# Patient Record
Sex: Female | Born: 1965 | Race: White | Hispanic: No | Smoking: Former smoker
Health system: Southern US, Community
[De-identification: ages and names within clinical notes are randomized; demographics above are authoritative.]

## PROBLEM LIST (undated history)

## (undated) DIAGNOSIS — I10 Essential (primary) hypertension: Secondary | ICD-10-CM

## (undated) DIAGNOSIS — G4700 Insomnia, unspecified: Secondary | ICD-10-CM

## (undated) DIAGNOSIS — K5909 Other constipation: Secondary | ICD-10-CM

## (undated) DIAGNOSIS — N189 Chronic kidney disease, unspecified: Secondary | ICD-10-CM

## (undated) DIAGNOSIS — F419 Anxiety disorder, unspecified: Secondary | ICD-10-CM

## (undated) DIAGNOSIS — F32A Depression, unspecified: Secondary | ICD-10-CM

## (undated) DIAGNOSIS — T7840XA Allergy, unspecified, initial encounter: Secondary | ICD-10-CM

## (undated) DIAGNOSIS — E785 Hyperlipidemia, unspecified: Secondary | ICD-10-CM

## (undated) HISTORY — DX: Allergy, unspecified, initial encounter: T78.40XA

## (undated) HISTORY — PX: BUNIONECTOMY: SHX129

## (undated) HISTORY — PX: WISDOM TOOTH EXTRACTION: SHX21

## (undated) HISTORY — DX: Depression, unspecified: F32.A

## (undated) HISTORY — DX: Hyperlipidemia, unspecified: E78.5

## (undated) HISTORY — PX: TUBAL LIGATION: SHX77

## (undated) HISTORY — DX: Other constipation: K59.09

## (undated) HISTORY — DX: Chronic kidney disease, unspecified: N18.9

---

## 1999-11-19 ENCOUNTER — Encounter: Admission: RE | Admit: 1999-11-19 | Discharge: 1999-11-19 | Payer: Self-pay | Admitting: *Deleted

## 1999-11-19 ENCOUNTER — Encounter: Payer: Self-pay | Admitting: *Deleted

## 2000-12-01 ENCOUNTER — Other Ambulatory Visit: Admission: RE | Admit: 2000-12-01 | Discharge: 2000-12-01 | Payer: Self-pay | Admitting: Obstetrics and Gynecology

## 2001-06-08 ENCOUNTER — Encounter: Admission: RE | Admit: 2001-06-08 | Discharge: 2001-09-06 | Payer: Self-pay

## 2001-06-10 ENCOUNTER — Encounter: Payer: Self-pay | Admitting: *Deleted

## 2001-06-10 ENCOUNTER — Ambulatory Visit (HOSPITAL_COMMUNITY): Admission: RE | Admit: 2001-06-10 | Discharge: 2001-06-10 | Payer: Self-pay | Admitting: *Deleted

## 2001-09-06 ENCOUNTER — Encounter: Admission: RE | Admit: 2001-09-06 | Discharge: 2001-12-05 | Payer: Self-pay

## 2002-01-14 ENCOUNTER — Other Ambulatory Visit: Admission: RE | Admit: 2002-01-14 | Discharge: 2002-01-14 | Payer: Self-pay | Admitting: Obstetrics and Gynecology

## 2004-05-31 ENCOUNTER — Ambulatory Visit (HOSPITAL_COMMUNITY): Admission: RE | Admit: 2004-05-31 | Discharge: 2004-05-31 | Payer: Self-pay | Admitting: Family Medicine

## 2013-02-07 ENCOUNTER — Encounter (INDEPENDENT_AMBULATORY_CARE_PROVIDER_SITE_OTHER): Payer: Self-pay | Admitting: Ophthalmology

## 2013-03-14 ENCOUNTER — Encounter (INDEPENDENT_AMBULATORY_CARE_PROVIDER_SITE_OTHER): Payer: Self-pay | Admitting: Ophthalmology

## 2013-07-25 ENCOUNTER — Emergency Department (HOSPITAL_COMMUNITY)
Admission: EM | Admit: 2013-07-25 | Discharge: 2013-07-25 | Disposition: A | Discharge status: Home / Self Care | Payer: Self-pay | Attending: Emergency Medicine | Admitting: Emergency Medicine

## 2013-07-25 ENCOUNTER — Encounter (HOSPITAL_COMMUNITY): Payer: Self-pay | Admitting: Emergency Medicine

## 2013-07-25 DIAGNOSIS — X500XXA Overexertion from strenuous movement or load, initial encounter: Secondary | ICD-10-CM | POA: Insufficient documentation

## 2013-07-25 DIAGNOSIS — M25511 Pain in right shoulder: Secondary | ICD-10-CM

## 2013-07-25 DIAGNOSIS — Z791 Long term (current) use of non-steroidal anti-inflammatories (NSAID): Secondary | ICD-10-CM | POA: Insufficient documentation

## 2013-07-25 DIAGNOSIS — IMO0002 Reserved for concepts with insufficient information to code with codable children: Secondary | ICD-10-CM | POA: Insufficient documentation

## 2013-07-25 DIAGNOSIS — G4700 Insomnia, unspecified: Secondary | ICD-10-CM | POA: Insufficient documentation

## 2013-07-25 DIAGNOSIS — Y990 Civilian activity done for income or pay: Secondary | ICD-10-CM | POA: Insufficient documentation

## 2013-07-25 DIAGNOSIS — F411 Generalized anxiety disorder: Secondary | ICD-10-CM | POA: Insufficient documentation

## 2013-07-25 DIAGNOSIS — Y9289 Other specified places as the place of occurrence of the external cause: Secondary | ICD-10-CM | POA: Insufficient documentation

## 2013-07-25 DIAGNOSIS — Z87891 Personal history of nicotine dependence: Secondary | ICD-10-CM | POA: Insufficient documentation

## 2013-07-25 DIAGNOSIS — I10 Essential (primary) hypertension: Secondary | ICD-10-CM | POA: Insufficient documentation

## 2013-07-25 DIAGNOSIS — Y9389 Activity, other specified: Secondary | ICD-10-CM | POA: Insufficient documentation

## 2013-07-25 DIAGNOSIS — Z79899 Other long term (current) drug therapy: Secondary | ICD-10-CM | POA: Insufficient documentation

## 2013-07-25 HISTORY — DX: Insomnia, unspecified: G47.00

## 2013-07-25 HISTORY — DX: Essential (primary) hypertension: I10

## 2013-07-25 HISTORY — DX: Anxiety disorder, unspecified: F41.9

## 2013-07-25 MED ORDER — NAPROXEN 500 MG PO TABS: 500.0000 mg | ORAL_TABLET | Freq: Two times a day (BID) | ORAL | Status: DC

## 2013-07-25 MED ORDER — HYDROCODONE-ACETAMINOPHEN 5-325 MG PO TABS
2.0000 | ORAL_TABLET | Freq: Once | ORAL | Status: AC
  Administered 2013-07-25: 2 via ORAL
  Filled 2013-07-25: qty 2

## 2013-07-25 MED ORDER — HYDROCODONE-ACETAMINOPHEN 5-325 MG PO TABS: 1.0000 | ORAL_TABLET | ORAL | Status: DC | PRN

## 2013-07-25 MED ORDER — IBUPROFEN 800 MG PO TABS
800.0000 mg | ORAL_TABLET | Freq: Once | ORAL | Status: AC
  Administered 2013-07-25: 800 mg via ORAL
  Filled 2013-07-25: qty 1

## 2013-07-25 NOTE — ED Notes (Signed)
Patient is alert and oriented x3.  She is complaining of right shoulder pain that started Friday at work after lifting a box.  She is complaining of burning, numbness and tingling in the right shoulder down to her hand.  She can lift the arm above her head with some relief.  Currently she rates her pain 10 of 10.

## 2013-07-25 NOTE — ED Notes (Signed)
Per EMS report: pt c/o right shoulder pain that began last Friday when pt attempted to pick up a box.  Pt reports pain has gradually become worse.  Pt went to urgent care this morning and was given percocet and an anti-inflammatory and that did not help.  Pt a/o x 4.  Pt is ambulatory in triage.  NAD noted.

## 2013-07-25 NOTE — Discharge Instructions (Signed)
Take naproxen for pain. For severe pain take norco or vicodin however realize they have the potential for addiction and it can make you sleepy and has tylenol in it.  No operating machinery while taking. If you were given medicines take as directed.  If you are on coumadin or contraceptives realize their levels and effectiveness is altered by many different medicines.  If you have any reaction (rash, tongues swelling, other) to the medicines stop taking and see a physician.   Please follow up as directed and return to the ER or see a physician for new or worsening symptoms.  Thank you.

## 2013-07-25 NOTE — ED Notes (Signed)
Patient is alert and oriented x3.  She was given DC instructions and follow up visit instructions.  Patient gave verbal understanding. She was DC ambulatory under her own power to home.  V/S stable.  He was not showing any signs of distress on DC 

## 2013-07-31 NOTE — ED Provider Notes (Signed)
CSN: [id number]     Arrival date & time 07/25/13  2017 History   First MD Initiated Contact with Patient 07/25/13 2200     Chief Complaint  Patient presents with  . Shoulder Pain     (Consider location/radiation/quality/duration/timing/severity/associated sxs/prior Treatment) HPI Comments: 48 yo female with htn hx presents with right shoulder pain since Friday after lifting a box.  Pt has had worsening pain since with movement of shoulder.  Pt seen at urgent care however pain persisting.  No chest pain or RUQ pain.  Pt tried NSAIDS without relief.  No hx of shoulder surgery.    Patient is a 48 y.o. female presenting with shoulder pain. The history is provided by the patient.  Shoulder Pain This is a new problem. Pertinent negatives include no chest pain, no abdominal pain, no headaches and no shortness of breath.    Past Medical History  Diagnosis Date  . Hypertension   . Anxiety   . Insomnia    Past Surgical History  Procedure Laterality Date  . Tubal ligation    . Bunionectomy     No family history on file. History  Substance Use Topics  . Smoking status: Former Games developer  . Smokeless tobacco: Not on file  . Alcohol Use: Yes     Comment: occasional   OB History   Grav Para Term Preterm Abortions TAB SAB Ect Mult Living                 Review of Systems  Constitutional: Negative for fever.  Respiratory: Negative for shortness of breath.   Cardiovascular: Negative for chest pain and leg swelling.  Gastrointestinal: Negative for vomiting and abdominal pain.  Musculoskeletal: Positive for arthralgias. Negative for joint swelling.  Skin: Negative for color change and rash.  Neurological: Negative for weakness, light-headedness and headaches.      Allergies  Codeine  Home Medications   Current Outpatient Rx  Name  Route  Sig  Dispense  Refill  . ALPRAZolam (XANAX) 1 MG tablet   Oral   Take 1 mg by mouth 2 (two) times daily as needed for anxiety.         Marland Kitchen  ibuprofen (ADVIL,MOTRIN) 200 MG tablet   Oral   Take 200 mg by mouth every 6 (six) hours as needed (pain.).         Marland Kitchen metoprolol (LOPRESSOR) 100 MG tablet   Oral   Take 200 mg by mouth daily.         . Oxycodone-Acetaminophen (PERCOCET PO)   Oral   Take 1 tablet by mouth once.         Marland Kitchen PRESCRIPTION MEDICATION   Oral   Take 1 tablet by mouth once. Anti-inflammatory medication.         Marland Kitchen zolpidem (AMBIEN) 10 MG tablet   Oral   Take 10 mg by mouth at bedtime as needed for sleep.         Marland Kitchen HYDROcodone-acetaminophen (NORCO) 5-325 MG per tablet   Oral   Take 1-2 tablets by mouth every 4 (four) hours as needed.   10 tablet   0   . naproxen (NAPROSYN) 500 MG tablet   Oral   Take 1 tablet (500 mg total) by mouth 2 (two) times daily.   20 tablet   0    BP 138/84  Pulse 76  Temp(Src) 98 F (36.7 C) (Oral)  Resp 20  SpO2 100%  LMP 07/12/2013 Physical Exam  Nursing note and  vitals reviewed. Constitutional: She appears well-developed and well-nourished. No distress.  HENT:  Head: Normocephalic and atraumatic.  Eyes: Pupils are equal, round, and reactive to light.  Neck: Normal range of motion. Neck supple.  Cardiovascular: Normal rate.   Pulmonary/Chest: Effort normal.  Abdominal: Soft. She exhibits no distension. There is no tenderness.  Musculoskeletal: She exhibits tenderness. She exhibits no edema.  Tender right anterior shoulder worse with empty can test, pt has brief 5+ strength however short lasted due to pain.   Pt has full rom with pain in ext rotation.  Relief with extreme flexion nv intact right arm Mild trapezius tender/ tight musculature on the right  Skin: Skin is warm.    ED Course  Procedures (including critical care time) Labs Review Labs Reviewed - No data to display Imaging Review No results found.   EKG Interpretation None      MDM   Final diagnoses:  Acute pain of right shoulder   Shoulder strain. Concern for ligamentous  injury.  Pt likely will need MRI however ortho consult outpt discussed with patient. No concern for cardiac or biliary at this time.   Pain improved in ED.   Results and differential diagnosis were discussed with the patient. Close follow up outpatient was discussed, patient comfortable with the plan.        Victoria SkeensJoshua M Shanon Seawright, MD 07/31/13 2219

## 2014-07-03 ENCOUNTER — Other Ambulatory Visit: Payer: Self-pay | Admitting: Orthopedic Surgery

## 2014-07-07 ENCOUNTER — Other Ambulatory Visit: Payer: Self-pay | Admitting: Orthopedic Surgery

## 2014-07-10 ENCOUNTER — Ambulatory Visit (HOSPITAL_COMMUNITY)
Admission: RE | Admit: 2014-07-10 | Discharge: 2014-07-10 | Disposition: A | Discharge status: Home / Self Care | Payer: BLUE CROSS/BLUE SHIELD | Source: Ambulatory Visit | Attending: Orthopedic Surgery | Admitting: Orthopedic Surgery

## 2014-07-10 ENCOUNTER — Encounter (HOSPITAL_COMMUNITY): Payer: Self-pay

## 2014-07-10 ENCOUNTER — Encounter (HOSPITAL_COMMUNITY)
Admission: RE | Admit: 2014-07-10 | Discharge: 2014-07-10 | Disposition: A | Discharge status: Home / Self Care | Payer: BLUE CROSS/BLUE SHIELD | Source: Ambulatory Visit | Attending: Orthopedic Surgery | Admitting: Orthopedic Surgery

## 2014-07-10 DIAGNOSIS — M542 Cervicalgia: Secondary | ICD-10-CM | POA: Insufficient documentation

## 2014-07-10 DIAGNOSIS — F419 Anxiety disorder, unspecified: Secondary | ICD-10-CM | POA: Diagnosis not present

## 2014-07-10 DIAGNOSIS — I10 Essential (primary) hypertension: Secondary | ICD-10-CM | POA: Insufficient documentation

## 2014-07-10 DIAGNOSIS — Z01818 Encounter for other preprocedural examination: Secondary | ICD-10-CM

## 2014-07-10 LAB — COMPREHENSIVE METABOLIC PANEL
ALT: 14 U/L (ref 0–35)
ANION GAP: 10 (ref 5–15)
AST: 20 U/L (ref 0–37)
Albumin: 4.5 g/dL (ref 3.5–5.2)
Alkaline Phosphatase: 58 U/L (ref 39–117)
BUN: 7 mg/dL (ref 6–23)
CALCIUM: 9.6 mg/dL (ref 8.4–10.5)
CHLORIDE: 105 mmol/L (ref 96–112)
CO2: 22 mmol/L (ref 19–32)
Creatinine, Ser: 0.96 mg/dL (ref 0.50–1.10)
GFR calc Af Amer: 80 mL/min — ABNORMAL LOW (ref >90)
GFR calc non Af Amer: 69 mL/min — ABNORMAL LOW (ref >90)
Glucose, Bld: 102 mg/dL — ABNORMAL HIGH (ref 70–99)
POTASSIUM: 3.6 mmol/L (ref 3.5–5.1)
SODIUM: 137 mmol/L (ref 135–145)
TOTAL PROTEIN: 7.7 g/dL (ref 6.0–8.3)
Total Bilirubin: 0.4 mg/dL (ref 0.3–1.2)

## 2014-07-10 LAB — PROTIME-INR
INR: 1.04 (ref 0.00–1.49)
PROTHROMBIN TIME: 13.7 s (ref 11.6–15.2)

## 2014-07-10 LAB — CBC WITH DIFFERENTIAL/PLATELET
Basophils Absolute: 0 10*3/uL (ref 0.0–0.1)
Basophils Relative: 1 % (ref 0–1)
Eosinophils Absolute: 0 10*3/uL (ref 0.0–0.7)
Eosinophils Relative: 1 % (ref 0–5)
HCT: 35.6 % — ABNORMAL LOW (ref 36.0–46.0)
Hemoglobin: 11.6 g/dL — ABNORMAL LOW (ref 12.0–15.0)
Lymphocytes Relative: 30 % (ref 12–46)
Lymphs Abs: 1.3 10*3/uL (ref 0.7–4.0)
MCH: 28.3 pg (ref 26.0–34.0)
MCHC: 32.6 g/dL (ref 30.0–36.0)
MCV: 86.8 fL (ref 78.0–100.0)
Monocytes Absolute: 0.3 10*3/uL (ref 0.1–1.0)
Monocytes Relative: 7 % (ref 3–12)
NEUTROS ABS: 2.8 10*3/uL (ref 1.7–7.7)
Neutrophils Relative %: 61 % (ref 43–77)
PLATELETS: 297 10*3/uL (ref 150–400)
RBC: 4.1 MIL/uL (ref 3.87–5.11)
RDW: 17.7 % — ABNORMAL HIGH (ref 11.5–15.5)
WBC: 4.4 10*3/uL (ref 4.0–10.5)

## 2014-07-10 LAB — URINALYSIS, ROUTINE W REFLEX MICROSCOPIC
GLUCOSE, UA: NEGATIVE mg/dL
HGB URINE DIPSTICK: NEGATIVE
KETONES UR: NEGATIVE mg/dL
Leukocytes, UA: NEGATIVE
Nitrite: NEGATIVE
PH: 5 (ref 5.0–8.0)
PROTEIN: NEGATIVE mg/dL
Specific Gravity, Urine: 1.028 (ref 1.005–1.030)
UROBILINOGEN UA: 0.2 mg/dL (ref 0.0–1.0)

## 2014-07-10 LAB — SURGICAL PCR SCREEN
MRSA, PCR: NEGATIVE
Staphylococcus aureus: NEGATIVE

## 2014-07-10 LAB — HCG, SERUM, QUALITATIVE: Preg, Serum: NEGATIVE

## 2014-07-10 LAB — APTT: APTT: 34 s (ref 24–37)

## 2014-07-10 NOTE — Progress Notes (Signed)
PCP is [place] Hout-PA-C at [facility]. Patient denied having any cardiac or pulmonary issues.

## 2014-07-10 NOTE — Pre-Procedure Instructions (Signed)
Drue DunMonica F Daley  07/10/2014   Your procedure is scheduled on:  Wednesday July 12, 2014 at 3:00 PM.   [facility] at 12:00 PM.  Call this number if you have problems the morning of surgery: [phone]   Remember:   Do not eat food or drink liquids after midnight.   Take these medicines the morning of surgery with A SIP OF WATER: Acetaminophen (Tylenol) if needed, Alprazolam (Xanax) if needed, Hydrocodone if needed, and Tizandine (Zanaflex) if needed   Do not wear jewelry, make-up or nail polish.  Do not wear lotions, powders, or perfumes.   Do not shave 48 hours prior to surgery.   Do not bring valuables to the hospital.  [facility] is not responsible for any belongings or valuables.               Contacts, dentures or bridgework may not be worn into surgery.  Leave suitcase in the car. After surgery it may be brought to your room.  For patients admitted to the hospital, discharge time is determined by your treatment team.               Patients discharged the day of surgery will not be allowed to drive home.  Name and phone number of your driver:   Special Instructions: Shower using CHG soap the night before and the morning of your surgery   Please read over the following fact sheets that you were given: Pain Booklet, Coughing and Deep Breathing, MRSA Information and Surgical Site Infection Prevention

## 2014-07-11 MED ORDER — CEFAZOLIN SODIUM-DEXTROSE 2-3 GM-% IV SOLR
2.0000 g | INTRAVENOUS | Status: AC
  Administered 2014-07-12: 2 g via INTRAVENOUS
  Filled 2014-07-11: qty 50

## 2014-07-11 NOTE — Anesthesia Preprocedure Evaluation (Addendum)
Anesthesia Evaluation  Patient identified by MRN, date of birth, ID band Patient awake    Reviewed: Allergy & Precautions, NPO status , Patient's Chart, lab work & pertinent test results, reviewed documented beta blocker date and time   Airway Mallampati: II   Neck ROM: Full    Dental  (+) Teeth Intact, Dental Advisory Given   Pulmonary neg pulmonary ROS, former smoker,  breath sounds clear to auscultation        Cardiovascular hypertension, Pt. on medications Rhythm:Regular     Neuro/Psych Anxiety negative neurological ROS     GI/Hepatic negative GI ROS, Neg liver ROS,   Endo/Other  negative endocrine ROS  Renal/GU negative Renal ROS     Musculoskeletal   Abdominal (+)  Abdomen: soft.    Peds  Hematology 11/35   Anesthesia Other Findings   Reproductive/Obstetrics                            Anesthesia Physical Anesthesia Plan  ASA: II  Anesthesia Plan: General   Post-op Pain Management:    Induction: Intravenous  Airway Management Planned: Oral ETT and Video Laryngoscope Planned  Additional Equipment:   Intra-op Plan:   Post-operative Plan: Extubation in OR  Informed Consent: I have reviewed the patients History and Physical, chart, labs and discussed the procedure including the risks, benefits and alternatives for the proposed anesthesia with the patient or authorized representative who has indicated his/her understanding and acceptance.     Plan Discussed with:   Anesthesia Plan Comments:         Anesthesia Quick Evaluation

## 2014-07-12 ENCOUNTER — Ambulatory Visit (HOSPITAL_COMMUNITY): Payer: BLUE CROSS/BLUE SHIELD | Admitting: Anesthesiology

## 2014-07-12 ENCOUNTER — Ambulatory Visit (HOSPITAL_COMMUNITY): Payer: BLUE CROSS/BLUE SHIELD

## 2014-07-12 ENCOUNTER — Observation Stay (HOSPITAL_COMMUNITY)
Admission: RE | Admit: 2014-07-12 | Discharge: 2014-07-13 | Disposition: A | Discharge status: Home / Self Care | Payer: BLUE CROSS/BLUE SHIELD | Source: Ambulatory Visit | Attending: Orthopedic Surgery | Admitting: Orthopedic Surgery

## 2014-07-12 ENCOUNTER — Encounter (HOSPITAL_COMMUNITY): Payer: Self-pay | Admitting: *Deleted

## 2014-07-12 ENCOUNTER — Encounter (HOSPITAL_COMMUNITY)
Admission: RE | Disposition: A | Discharge status: Home / Self Care | Payer: Self-pay | Source: Ambulatory Visit | Attending: Orthopedic Surgery

## 2014-07-12 DIAGNOSIS — I10 Essential (primary) hypertension: Secondary | ICD-10-CM | POA: Insufficient documentation

## 2014-07-12 DIAGNOSIS — Z9851 Tubal ligation status: Secondary | ICD-10-CM | POA: Diagnosis not present

## 2014-07-12 DIAGNOSIS — F1099 Alcohol use, unspecified with unspecified alcohol-induced disorder: Secondary | ICD-10-CM | POA: Diagnosis not present

## 2014-07-12 DIAGNOSIS — F419 Anxiety disorder, unspecified: Secondary | ICD-10-CM | POA: Diagnosis not present

## 2014-07-12 DIAGNOSIS — M5410 Radiculopathy, site unspecified: Secondary | ICD-10-CM | POA: Diagnosis present

## 2014-07-12 DIAGNOSIS — Z87891 Personal history of nicotine dependence: Secondary | ICD-10-CM | POA: Insufficient documentation

## 2014-07-12 DIAGNOSIS — M4802 Spinal stenosis, cervical region: Secondary | ICD-10-CM | POA: Diagnosis not present

## 2014-07-12 DIAGNOSIS — Z885 Allergy status to narcotic agent status: Secondary | ICD-10-CM | POA: Diagnosis not present

## 2014-07-12 DIAGNOSIS — G4700 Insomnia, unspecified: Secondary | ICD-10-CM | POA: Diagnosis not present

## 2014-07-12 DIAGNOSIS — M5412 Radiculopathy, cervical region: Secondary | ICD-10-CM | POA: Diagnosis present

## 2014-07-12 DIAGNOSIS — Z419 Encounter for procedure for purposes other than remedying health state, unspecified: Secondary | ICD-10-CM

## 2014-07-12 DIAGNOSIS — M5012 Cervical disc disorder with radiculopathy, mid-cervical region: Secondary | ICD-10-CM | POA: Diagnosis not present

## 2014-07-12 HISTORY — PX: ANTERIOR CERVICAL DECOMP/DISCECTOMY FUSION: SHX1161

## 2014-07-12 HISTORY — PX: CERVICAL DISC SURGERY: SHX588

## 2014-07-12 SURGERY — ANTERIOR CERVICAL DECOMPRESSION/DISCECTOMY FUSION 2 LEVELS
Anesthesia: General | Site: Neck

## 2014-07-12 MED ORDER — MORPHINE SULFATE 4 MG/ML IJ SOLN
INTRAMUSCULAR | Status: AC
  Administered 2014-07-12: 4 mg
  Filled 2014-07-12: qty 1

## 2014-07-12 MED ORDER — PROPOFOL 10 MG/ML IV BOLUS
INTRAVENOUS | Status: AC
  Filled 2014-07-12: qty 20

## 2014-07-12 MED ORDER — MORPHINE SULFATE 2 MG/ML IJ SOLN
1.0000 mg | INTRAMUSCULAR | Status: DC | PRN
  Administered 2014-07-12 – 2014-07-13 (×4): 2 mg via INTRAVENOUS
  Filled 2014-07-12 (×4): qty 1

## 2014-07-12 MED ORDER — SODIUM CHLORIDE 0.9 % IJ SOLN: 3.0000 mL | INTRAMUSCULAR | Status: DC | PRN

## 2014-07-12 MED ORDER — FENTANYL CITRATE 0.05 MG/ML IJ SOLN
INTRAMUSCULAR | Status: DC | PRN
  Administered 2014-07-12: 100 ug via INTRAVENOUS
  Administered 2014-07-12 (×3): 50 ug via INTRAVENOUS

## 2014-07-12 MED ORDER — CEFAZOLIN SODIUM 1-5 GM-% IV SOLN
1.0000 g | Freq: Three times a day (TID) | INTRAVENOUS | Status: AC
  Administered 2014-07-13 (×2): 1 g via INTRAVENOUS
  Filled 2014-07-12 (×5): qty 50

## 2014-07-12 MED ORDER — PROPOFOL 10 MG/ML IV BOLUS
INTRAVENOUS | Status: DC | PRN
  Administered 2014-07-12: 150 mg via INTRAVENOUS

## 2014-07-12 MED ORDER — MIDAZOLAM HCL 2 MG/2ML IJ SOLN
INTRAMUSCULAR | Status: AC
  Filled 2014-07-12: qty 2

## 2014-07-12 MED ORDER — FENTANYL CITRATE 0.05 MG/ML IJ SOLN
INTRAMUSCULAR | Status: AC
Start: 2014-07-12 — End: 2014-07-12
  Filled 2014-07-12: qty 5

## 2014-07-12 MED ORDER — LIDOCAINE HCL (CARDIAC) 20 MG/ML IV SOLN
INTRAVENOUS | Status: DC | PRN
  Administered 2014-07-12: 100 mg via INTRAVENOUS

## 2014-07-12 MED ORDER — ALUM & MAG HYDROXIDE-SIMETH 200-200-20 MG/5ML PO SUSP: 30.0000 mL | Freq: Four times a day (QID) | ORAL | Status: DC | PRN

## 2014-07-12 MED ORDER — THROMBIN 20000 UNITS EX KIT
PACK | CUTANEOUS | Status: DC | PRN
  Administered 2014-07-12: 20 mL via TOPICAL

## 2014-07-12 MED ORDER — PHENOL 1.4 % MT LIQD
1.0000 | OROMUCOSAL | Status: DC | PRN
  Filled 2014-07-12: qty 177

## 2014-07-12 MED ORDER — NEOSTIGMINE METHYLSULFATE 10 MG/10ML IV SOLN
INTRAVENOUS | Status: DC | PRN
  Administered 2014-07-12: 3 mg via INTRAVENOUS

## 2014-07-12 MED ORDER — ACETAMINOPHEN 325 MG PO TABS: 650.0000 mg | ORAL_TABLET | ORAL | Status: DC | PRN

## 2014-07-12 MED ORDER — SODIUM CHLORIDE 0.9 % IJ SOLN
3.0000 mL | Freq: Two times a day (BID) | INTRAMUSCULAR | Status: DC
Start: 2014-07-12 — End: 2014-07-13
  Administered 2014-07-12 – 2014-07-13 (×2): 3 mL via INTRAVENOUS

## 2014-07-12 MED ORDER — ACETAMINOPHEN 650 MG RE SUPP: 650.0000 mg | RECTAL | Status: DC | PRN

## 2014-07-12 MED ORDER — ROCURONIUM BROMIDE 100 MG/10ML IV SOLN
INTRAVENOUS | Status: DC | PRN
  Administered 2014-07-12: 40 mg via INTRAVENOUS

## 2014-07-12 MED ORDER — GLYCOPYRROLATE 0.2 MG/ML IJ SOLN
INTRAMUSCULAR | Status: DC | PRN
  Administered 2014-07-12: 0.4 mg via INTRAVENOUS

## 2014-07-12 MED ORDER — PHENYLEPHRINE HCL 10 MG/ML IJ SOLN
INTRAMUSCULAR | Status: DC | PRN
  Administered 2014-07-12 (×3): 80 ug via INTRAVENOUS

## 2014-07-12 MED ORDER — ONDANSETRON HCL 4 MG/2ML IJ SOLN
INTRAMUSCULAR | Status: AC
  Filled 2014-07-12: qty 2

## 2014-07-12 MED ORDER — ONDANSETRON HCL 4 MG/2ML IJ SOLN: 4.0000 mg | INTRAMUSCULAR | Status: DC | PRN

## 2014-07-12 MED ORDER — PROMETHAZINE HCL 25 MG/ML IJ SOLN
6.2500 mg | INTRAMUSCULAR | Status: DC | PRN
  Administered 2014-07-12: 6.25 mg via INTRAVENOUS

## 2014-07-12 MED ORDER — ROCURONIUM BROMIDE 50 MG/5ML IV SOLN
INTRAVENOUS | Status: AC
  Filled 2014-07-12: qty 1

## 2014-07-12 MED ORDER — FENTANYL CITRATE 0.05 MG/ML IJ SOLN
INTRAMUSCULAR | Status: AC
  Administered 2014-07-12: 50 ug via INTRAVENOUS
  Filled 2014-07-12: qty 2

## 2014-07-12 MED ORDER — 0.9 % SODIUM CHLORIDE (POUR BTL) OPTIME
TOPICAL | Status: DC | PRN
  Administered 2014-07-12: 1000 mL

## 2014-07-12 MED ORDER — LACTATED RINGERS IV SOLN
INTRAVENOUS | Status: DC | PRN
  Administered 2014-07-12 (×2): via INTRAVENOUS

## 2014-07-12 MED ORDER — MEPERIDINE HCL 25 MG/ML IJ SOLN: 6.2500 mg | INTRAMUSCULAR | Status: DC | PRN

## 2014-07-12 MED ORDER — DOCUSATE SODIUM 100 MG PO CAPS
100.0000 mg | ORAL_CAPSULE | Freq: Two times a day (BID) | ORAL | Status: DC
  Administered 2014-07-12: 100 mg via ORAL
  Filled 2014-07-12: qty 1

## 2014-07-12 MED ORDER — FENTANYL CITRATE 0.05 MG/ML IJ SOLN
25.0000 ug | INTRAMUSCULAR | Status: DC | PRN
  Administered 2014-07-12 (×3): 50 ug via INTRAVENOUS

## 2014-07-12 MED ORDER — FLEET ENEMA 7-19 GM/118ML RE ENEM: 1.0000 | ENEMA | Freq: Once | RECTAL | Status: AC | PRN

## 2014-07-12 MED ORDER — SENNOSIDES-DOCUSATE SODIUM 8.6-50 MG PO TABS: 1.0000 | ORAL_TABLET | Freq: Every evening | ORAL | Status: DC | PRN

## 2014-07-12 MED ORDER — OXYCODONE-ACETAMINOPHEN 5-325 MG PO TABS
1.0000 | ORAL_TABLET | ORAL | Status: DC | PRN
  Administered 2014-07-12 – 2014-07-13 (×4): 2 via ORAL
  Filled 2014-07-12 (×4): qty 2

## 2014-07-12 MED ORDER — MIDAZOLAM HCL 5 MG/5ML IJ SOLN
INTRAMUSCULAR | Status: DC | PRN
  Administered 2014-07-12: 2 mg via INTRAVENOUS

## 2014-07-12 MED ORDER — ONDANSETRON HCL 4 MG/2ML IJ SOLN
INTRAMUSCULAR | Status: DC | PRN
  Administered 2014-07-12: 4 mg via INTRAVENOUS

## 2014-07-12 MED ORDER — POVIDONE-IODINE 7.5 % EX SOLN
Freq: Once | CUTANEOUS | Status: DC
  Filled 2014-07-12: qty 118

## 2014-07-12 MED ORDER — ZOLPIDEM TARTRATE 5 MG PO TABS
5.0000 mg | ORAL_TABLET | Freq: Every evening | ORAL | Status: DC | PRN
  Administered 2014-07-13: 5 mg via ORAL
  Filled 2014-07-12: qty 1

## 2014-07-12 MED ORDER — BISACODYL 5 MG PO TBEC: 5.0000 mg | DELAYED_RELEASE_TABLET | Freq: Every day | ORAL | Status: DC | PRN

## 2014-07-12 MED ORDER — DIAZEPAM 5 MG PO TABS
5.0000 mg | ORAL_TABLET | Freq: Four times a day (QID) | ORAL | Status: DC | PRN
  Administered 2014-07-12 – 2014-07-13 (×3): 5 mg via ORAL
  Filled 2014-07-12 (×3): qty 1

## 2014-07-12 MED ORDER — SODIUM CHLORIDE 0.9 % IV SOLN: 250.0000 mL | INTRAVENOUS | Status: DC

## 2014-07-12 MED ORDER — DIAZEPAM 5 MG PO TABS
ORAL_TABLET | ORAL | Status: AC
  Administered 2014-07-12: 5 mg via ORAL
  Filled 2014-07-12: qty 1

## 2014-07-12 MED ORDER — LACTATED RINGERS IV SOLN
INTRAVENOUS | Status: DC
  Administered 2014-07-12: 12:00:00 via INTRAVENOUS

## 2014-07-12 MED ORDER — OXYCODONE-ACETAMINOPHEN 5-325 MG PO TABS
ORAL_TABLET | ORAL | Status: AC
  Administered 2014-07-12: 2 via ORAL
  Filled 2014-07-12: qty 2

## 2014-07-12 MED ORDER — BUPIVACAINE-EPINEPHRINE 0.25% -1:200000 IJ SOLN
INTRAMUSCULAR | Status: DC | PRN
  Administered 2014-07-12: 6 mL

## 2014-07-12 MED ORDER — PROMETHAZINE HCL 25 MG/ML IJ SOLN
INTRAMUSCULAR | Status: AC
  Administered 2014-07-12: 6.25 mg via INTRAVENOUS
  Filled 2014-07-12: qty 1

## 2014-07-12 MED ORDER — ZOLPIDEM TARTRATE 5 MG PO TABS
5.0000 mg | ORAL_TABLET | Freq: Every evening | ORAL | Status: DC | PRN
Start: 2014-07-12 — End: 2014-07-13
  Administered 2014-07-13: 5 mg via ORAL
  Filled 2014-07-12: qty 1

## 2014-07-12 MED ORDER — MENTHOL 3 MG MT LOZG: 1.0000 | LOZENGE | OROMUCOSAL | Status: DC | PRN

## 2014-07-12 MED ORDER — LIDOCAINE HCL (CARDIAC) 20 MG/ML IV SOLN
INTRAVENOUS | Status: AC
  Filled 2014-07-12: qty 5

## 2014-07-12 MED ORDER — ALPRAZOLAM 0.5 MG PO TABS
1.0000 mg | ORAL_TABLET | Freq: Two times a day (BID) | ORAL | Status: DC | PRN
  Administered 2014-07-13: 1 mg via ORAL
  Filled 2014-07-12: qty 2

## 2014-07-12 SURGICAL SUPPLY — 76 items
BENZOIN TINCTURE PRP APPL 2/3 (GAUZE/BANDAGES/DRESSINGS) ×3 IMPLANT
BIT DRILL NEURO 2X3.1 SFT TUCH (MISCELLANEOUS) ×1 IMPLANT
BIT DRILL SKYLINE 12MM (BIT) ×1 IMPLANT
BLADE SURG 15 STRL LF DISP TIS (BLADE) ×1 IMPLANT
BLADE SURG 15 STRL SS (BLADE) ×2
BLADE SURG ROTATE 9660 (MISCELLANEOUS) IMPLANT
BUR MATCHSTICK NEURO 3.0 LAGG (BURR) ×3 IMPLANT
CARTRIDGE OIL MAESTRO DRILL (MISCELLANEOUS) ×1 IMPLANT
CLOSURE STERI-STRIP 1/2X4 (GAUZE/BANDAGES/DRESSINGS) ×1
CLOSURE WOUND 1/2 X4 (GAUZE/BANDAGES/DRESSINGS) ×1
CLSR STERI-STRIP ANTIMIC 1/2X4 (GAUZE/BANDAGES/DRESSINGS) ×2 IMPLANT
COLLAR CERV LO CONTOUR FIRM DE (SOFTGOODS) IMPLANT
CORDS BIPOLAR (ELECTRODE) IMPLANT
COVER SURGICAL LIGHT HANDLE (MISCELLANEOUS) ×3 IMPLANT
CRADLE DONUT ADULT HEAD (MISCELLANEOUS) ×3 IMPLANT
DEVICE ENDSKLTN CRVCL 5MM-0SM (Orthopedic Implant) ×2 IMPLANT
DIFFUSER DRILL AIR PNEUMATIC (MISCELLANEOUS) ×3 IMPLANT
DRAIN JACKSON RD 7FR 3/32 (WOUND CARE) IMPLANT
DRAPE C-ARM 42X72 X-RAY (DRAPES) ×3 IMPLANT
DRAPE POUCH INSTRU U-SHP 10X18 (DRAPES) ×3 IMPLANT
DRAPE SURG 17X23 STRL (DRAPES) ×3 IMPLANT
DRILL BIT SKYLINE 12MM (BIT) ×2
DRILL NEURO 2X3.1 SOFT TOUCH (MISCELLANEOUS) ×3
DURAPREP 26ML APPLICATOR (WOUND CARE) ×3 IMPLANT
ELECT COATED BLADE 2.86 ST (ELECTRODE) ×3 IMPLANT
ELECT REM PT RETURN 9FT ADLT (ELECTROSURGICAL) ×3
ELECTRODE REM PT RTRN 9FT ADLT (ELECTROSURGICAL) ×1 IMPLANT
ENDOSKELETON CERVICAL 5MM-0SM (Orthopedic Implant) ×6 IMPLANT
EVACUATOR SILICONE 100CC (DRAIN) IMPLANT
GAUZE SPONGE 4X4 12PLY STRL (GAUZE/BANDAGES/DRESSINGS) ×3 IMPLANT
GAUZE SPONGE 4X4 16PLY XRAY LF (GAUZE/BANDAGES/DRESSINGS) ×3 IMPLANT
GLOVE BIO SURGEON STRL SZ7 (GLOVE) ×3 IMPLANT
GLOVE BIO SURGEON STRL SZ8 (GLOVE) ×3 IMPLANT
GLOVE BIOGEL PI IND STRL 7.5 (GLOVE) ×2 IMPLANT
GLOVE BIOGEL PI IND STRL 8 (GLOVE) ×1 IMPLANT
GLOVE BIOGEL PI INDICATOR 7.5 (GLOVE) ×4
GLOVE BIOGEL PI INDICATOR 8 (GLOVE) ×2
GOWN STRL REUS W/ TWL LRG LVL3 (GOWN DISPOSABLE) ×2 IMPLANT
GOWN STRL REUS W/ TWL XL LVL3 (GOWN DISPOSABLE) ×1 IMPLANT
GOWN STRL REUS W/TWL LRG LVL3 (GOWN DISPOSABLE) ×4
GOWN STRL REUS W/TWL XL LVL3 (GOWN DISPOSABLE) ×2
IV CATH 14GX2 1/4 (CATHETERS) ×3 IMPLANT
KIT BASIN OR (CUSTOM PROCEDURE TRAY) ×3 IMPLANT
KIT ROOM TURNOVER OR (KITS) ×3 IMPLANT
MANIFOLD NEPTUNE II (INSTRUMENTS) ×3 IMPLANT
NEEDLE 27GAX1X1/2 (NEEDLE) ×3 IMPLANT
NEEDLE SPNL 20GX3.5 QUINCKE YW (NEEDLE) ×3 IMPLANT
NS IRRIG 1000ML POUR BTL (IV SOLUTION) ×3 IMPLANT
OIL CARTRIDGE MAESTRO DRILL (MISCELLANEOUS) ×3
PACK ORTHO CERVICAL (CUSTOM PROCEDURE TRAY) ×3 IMPLANT
PAD ARMBOARD 7.5X6 YLW CONV (MISCELLANEOUS) ×6 IMPLANT
PATTIES SURGICAL .5 X.5 (GAUZE/BANDAGES/DRESSINGS) IMPLANT
PATTIES SURGICAL .5 X1 (DISPOSABLE) IMPLANT
PIN DISTRACTION 12MM (PIN) ×4
PIN DSTRCT 12XNS SS ACIS (PIN) ×2 IMPLANT
PLATE SKYLINE 2LVL 24ML (Plate) ×3 IMPLANT
PUTTY BONE DBX 2.5 MIS (Bone Implant) ×3 IMPLANT
SCREW VARIABLE SELF TAP 12MM (Screw) ×18 IMPLANT
SPONGE GAUZE 4X4 12PLY STER LF (GAUZE/BANDAGES/DRESSINGS) ×3 IMPLANT
SPONGE INTESTINAL PEANUT (DISPOSABLE) ×3 IMPLANT
SPONGE SURGIFOAM ABS GEL 100 (HEMOSTASIS) ×3 IMPLANT
STRIP CLOSURE SKIN 1/2X4 (GAUZE/BANDAGES/DRESSINGS) ×2 IMPLANT
SURGIFLO TRUKIT (HEMOSTASIS) IMPLANT
SUT MNCRL AB 4-0 PS2 18 (SUTURE) ×3 IMPLANT
SUT SILK 4 0 (SUTURE)
SUT SILK 4-0 18XBRD TIE 12 (SUTURE) IMPLANT
SUT VIC AB 2-0 CT2 18 VCP726D (SUTURE) ×6 IMPLANT
SYR BULB IRRIGATION 50ML (SYRINGE) ×3 IMPLANT
SYR CONTROL 10ML LL (SYRINGE) ×6 IMPLANT
TAPE CLOTH 4X10 WHT NS (GAUZE/BANDAGES/DRESSINGS) ×3 IMPLANT
TAPE CLOTH SURG 4X10 WHT LF (GAUZE/BANDAGES/DRESSINGS) ×3 IMPLANT
TAPE UMBILICAL COTTON 1/8X30 (MISCELLANEOUS) ×3 IMPLANT
TOWEL OR 17X24 6PK STRL BLUE (TOWEL DISPOSABLE) ×3 IMPLANT
TOWEL OR 17X26 10 PK STRL BLUE (TOWEL DISPOSABLE) ×3 IMPLANT
WATER STERILE IRR 1000ML POUR (IV SOLUTION) IMPLANT
YANKAUER SUCT BULB TIP NO VENT (SUCTIONS) ×3 IMPLANT

## 2014-07-12 NOTE — Transfer of Care (Signed)
Immediate Anesthesia Transfer of Care Note  Patient: Victoria Floyd  Procedure(s) Performed: Procedure(s) with comments: ANTERIOR CERVICAL DECOMPRESSION/DISCECTOMY FUSION 2 LEVELS (N/A) - Anterior cervical decompression fusion, cervical 5-6, cervical 6-7 with instrumentation, allograft  Patient Location: PACU  Anesthesia Type:General  Level of Consciousness: awake and alert   Airway & Oxygen Therapy: Patient Spontanous Breathing and Patient connected to nasal cannula oxygen  Post-op Assessment: Report given to RN and Post -op Vital signs reviewed and stable  Post vital signs: Reviewed and stable  Last Vitals:  Filed Vitals:   07/12/14 1137  BP: 132/92  Pulse: 90  Temp: 37 C    Complications: No apparent anesthesia complications

## 2014-07-12 NOTE — Anesthesia Postprocedure Evaluation (Signed)
  Anesthesia Post-op Note  Patient: Victoria Floyd  Procedure(s) Performed: Procedure(s) with comments: ANTERIOR CERVICAL DECOMPRESSION/DISCECTOMY FUSION 2 LEVELS (N/A) - Anterior cervical decompression fusion, cervical 5-6, cervical 6-7 with instrumentation, allograft  Patient Location: PACU  Anesthesia Type:General  Level of Consciousness: awake  Airway and Oxygen Therapy: Patient Spontanous Breathing  Post-op Pain: mild  Post-op Assessment: Post-op Vital signs reviewed  Post-op Vital Signs: Reviewed  Last Vitals:  Filed Vitals:   07/12/14 1137  BP: 132/92  Pulse: 90  Temp: 37 C    Complications: No apparent anesthesia complications

## 2014-07-12 NOTE — H&P (Signed)
     PREOPERATIVE H&P  Chief Complaint: right arm pain  HPI: Victoria Floyd is a 49 y.o. female who presents with ongoing pain in the right arm  MRI reveals NF stenosis C5-7  Patient has failed multiple forms of conservative care and continues to have pain (see office notes for additional details regarding the patient's full course of treatment)  Past Medical History  Diagnosis Date  . Anxiety   . Insomnia   . Hypertension     hx of   Past Surgical History  Procedure Laterality Date  . Tubal ligation    . Bunionectomy Bilateral   . Wisdom tooth extraction     History   Social History  . Marital Status: Single    Spouse Name: N/A  . Number of Children: N/A  . Years of Education: N/A   Social History Main Topics  . Smoking status: Former Games developermoker  . Smokeless tobacco: Not on file  . Alcohol Use: Yes     Comment: occasional  . Drug Use: No  . Sexual Activity: Not on file   Other Topics Concern  . Not on file   Social History Narrative   No family history on file. Allergies  Allergen Reactions  . Codeine Itching   Prior to Admission medications   Medication Sig Start Date End Date Taking? Authorizing Provider  acetaminophen (TYLENOL) 500 MG tablet Take 1,000 mg by mouth every 6 (six) hours as needed for mild pain or moderate pain.   Yes Historical Provider, MD  ALPRAZolam Prudy Feeler(XANAX) 1 MG tablet Take 1 mg by mouth 2 (two) times daily as needed for anxiety.   Yes Historical Provider, MD  HYDROcodone-acetaminophen (NORCO) 5-325 MG per tablet Take 1-2 tablets by mouth every 4 (four) hours as needed. 07/25/13  Yes Enid SkeensJoshua M Zavitz, MD  Multiple Vitamins-Minerals (MULTIVITAMIN WITH MINERALS) tablet Take 1 tablet by mouth daily.   Yes Historical Provider, MD  tiZANidine (ZANAFLEX) 4 MG capsule Take 4 mg by mouth 2 (two) times daily as needed for muscle spasms.   Yes Historical Provider, MD  zolpidem (AMBIEN) 10 MG tablet Take 10 mg by mouth at bedtime as needed for sleep.    Yes Historical Provider, MD  naproxen (NAPROSYN) 500 MG tablet Take 1 tablet (500 mg total) by mouth 2 (two) times daily. Patient not taking: Reported on 07/05/2014 07/25/13   Enid SkeensJoshua M Zavitz, MD     All other systems have been reviewed and were otherwise negative with the exception of those mentioned in the HPI and as above.  Physical Exam: There were no vitals filed for this visit.  General: Alert, no acute distress Cardiovascular: No pedal edema Respiratory: No cyanosis, no use of accessory musculature Skin: No lesions in the area of chief complaint Neurologic: Sensation intact distally Psychiatric: Patient is competent for consent with normal mood and affect Lymphatic: No axillary or cervical lymphadenopathy  MUSCULOSKELETAL: + spurling's on right  Assessment/Plan: Right arm pain Plan for Procedure(s): ANTERIOR CERVICAL DECOMPRESSION/DISCECTOMY FUSION 2 LEVELS   Emilee HeroUMONSKI,Travious Vanover LEONARD, MD 07/12/2014 7:18 AM

## 2014-07-13 ENCOUNTER — Encounter (HOSPITAL_COMMUNITY): Payer: Self-pay | Admitting: General Practice

## 2014-07-13 DIAGNOSIS — M5012 Cervical disc disorder with radiculopathy, mid-cervical region: Secondary | ICD-10-CM | POA: Diagnosis not present

## 2014-07-13 MED ORDER — DIAZEPAM 5 MG PO TABS: 5.0000 mg | ORAL_TABLET | Freq: Four times a day (QID) | ORAL | Status: DC | PRN

## 2014-07-13 MED ORDER — DOCUSATE SODIUM 100 MG PO CAPS: 100.0000 mg | ORAL_CAPSULE | Freq: Two times a day (BID) | ORAL | Status: DC

## 2014-07-13 MED ORDER — OXYCODONE-ACETAMINOPHEN 5-325 MG PO TABS: 1.0000 | ORAL_TABLET | ORAL | Status: DC | PRN

## 2014-07-13 NOTE — Progress Notes (Signed)
Orthopedic Tech Progress Note Patient Details:  Victoria Floyd [birth year] [id number]  Ortho Devices Type of Ortho Device: [place] cervical collar Ortho Device/Splint Interventions: Ordered   [place] R Thompson 07/13/2014, 12:49 PM

## 2014-07-13 NOTE — Plan of Care (Signed)
Problem: Consults Goal: Diagnosis - Spinal Surgery Cervical Spine Fusion: ACDF C5-6, C6-7     

## 2014-07-13 NOTE — Progress Notes (Signed)
    Patient doing well Denies right arm pain + soreness in throat and neck   Physical Exam: Filed Vitals:   07/13/14 0649  BP: 123/82  Pulse: 89  Temp: 98.8 F (37.1 C)  Resp:     Dressing in place NVI Neck soft/supple  POD #1 s/p C5-7 ACDF, doing well with resolution of right arm pain  - up with PT/OT, encourage ambulation - Percocet for pain, Valium for muscle spasms - likely d/c home today

## 2014-07-13 NOTE — Op Note (Signed)
NAMVelvet Bathe:  Hang, Elizabeth               ACCOUNT NO.:  [id number]  MEDICAL RECORD NO.:  [id number]  LOCATION:  [id number]                        FACILITY:  [facility]  PHYSICIAN:  Estill BambergMark Jenia Klepper, MD      DATE OF BIRTH:  [birth year]  DATE OF PROCEDURE:  07/12/2014                              OPERATIVE REPORT   PREOPERATIVE DIAGNOSES: 1. Right-sided cervical radiculopathy. 2. Neural foraminal stenosis, C5-6, C6-7. 3. Degenerative disk disease, C5-6, C6-7.  POSTOPERATIVE DIAGNOSES: 1. Right-sided cervical radiculopathy. 2. Neural foraminal stenosis, C5-6, C6-7. 3. Degenerative disk disease, C5-6, C6-7.  PROCEDURE: 1. Anterior cervical decompression and fusion C5-6, C6-7. 2. Placement of anterior instrumentation, C5 to C7. 3. Insertion of interbody device x2 (5 mm small Titan interbody     spacer). 4. Use of morselized allograft. 5. Intraoperative use of fluoroscopy.  SURGEON:  Estill BambergMark Sheriden Archibeque, MD  ASSISTANT:  Jason CoopKayla McKenzie, PA-C  ANESTHESIA:  General endotracheal anesthesia.  COMPLICATIONS:  None.  DISPOSITION:  Stable.  ESTIMATED BLOOD LOSS:  Minimal.  INDICATIONS FOR SURGERY:  Briefly, Ms. Lenise ArenaMeyers is a very pleasant 49 year old female, who did initially present to me on Sep 26, 2013, status post a work injury that did occur on July 22, 2013.  The patient was lifting a heavy object on this date, and did go on to have pain in her right shoulder, which did progress to ultimately cause pain at the posterolateral aspect of her right arm, which did travel into her forearm and into her index and long finger on the right side.  She also was noticing increasing weakness in the right arm.  The patient did fail conservative care.  An MRI did reveal neuroforaminal stenosis on the right side, prominently at C5-6 and C6-7.  I did feel that those findings were responsible for the patient's symptoms.  We therefore did discuss proceeding with the surgery noted above.  The patient did  fully understand the risks and limitations of the procedure, and did elect to proceed.  OPERATIVE DETAILS:  On July 12, 2014, patient was brought to surgery and general endotracheal anesthesia was administered.  The patient was placed supine on the hospital bed.  The neck was gently extended.  The patient's arms were secured to her sides.  All bony prominences were meticulously padded.  The neck was then prepped and draped.  A time-out procedure was performed.  I then made a left-sided transverse incision overlying the C6 vertebral body.  The platysma was incised.  A Clementeen GrahamSmith- Robinson approach was used and the anterior spine was noted.  A self- retaining retractor was placed.  I then exposed the vertebral bodies of C5, C6, and C7.  Anterior osteophytes were removed.  I then placed Caspar pins into the C6 and C7 vertebral bodies.  Distraction was applied.  I then went forward with a standard diskectomy using a 15- blade knife followed by a series of curettes and pituitary rongeurs. The posterior longitudinal ligament was identified and entered using a nerve hook.  I then proceeded with performing a thorough neural foraminal decompression on both the right and on the left sides.  The decompression was confirmed using a nerve hook.  I then placed a  series of intervertebral spacer trials.  I did feel that a 5 mm trial will be the most appropriate fit.  The appropriate size interbody implant was then packed with DBX mix and tamped into position in the usual fashion. I was very pleased with the press fit of the implant.  The lower Caspar pin was removed and bone wax was placed in its place.  A new Caspar pin was placed into the C5 vertebral body.  Once again, distraction was applied, and once again, a diskectomy was performed in the manner previously noted.  Once again, a thorough bilateral neural foraminal decompression was confirmed.  The appropriate-sized intervertebral implant was  packed with DBX mix, and tamped into position. Intraoperative fluoroscopy did confirm the appropriate positioning of the implant.  The appropriate-sized anterior cervical plate was placed over the anterior spine.  A 12 mm variable angle screws were placed, 2 in each vertebral body from C5 to C7 for total of 6 vertebral body screws.  The screws were then locked to the plate using the CAM locking mechanism.  The wound was then copiously irrigated.  I was very pleased with the final fluoroscopic images.  The platysma was then closed using 2-0 Vicryl.  The skin was then closed using 3-0 Monocryl.  Benzoin and Steri-Strips were applied followed by sterile dressing.  All instrument counts were correct at the termination of the procedure.  Of note, Jason Coop was my assistant throughout surgery, and did aid in retraction, suctioning, and closure.     Estill Bamberg, MD     MD/MEDQ  D:  07/12/2014  T:  07/13/2014  Job:  [id number]

## 2014-07-20 NOTE — Discharge Summary (Signed)
Patient ID: Victoria DunMonica F Pisarski MRN: [id number] DOB/AGE: [birth year] 49 y.o.  Admit date: 07/12/2014 Discharge date: 07/13/2014  Admission Diagnoses:  Active Problems:   Radiculopathy   Discharge Diagnoses:  Same  Past Medical History  Diagnosis Date  . Anxiety   . Insomnia   . Hypertension     hx of    Surgeries: Procedure(s): ANTERIOR CERVICAL DECOMPRESSION/DISCECTOMY FUSION 2 LEVELS C5-7 on 07/12/2014   Consultants:  None  Discharged Condition: Improved  Hospital Course: Victoria Floyd is an 49 y.o. female who was admitted 07/12/2014 for operative treatment of radiculopathy. Patient has severe unremitting pain that affects sleep, daily activities, and work/hobbies. After pre-op clearance the patient was taken to the operating room on 07/12/2014 and underwent  Procedure(s): ANTERIOR CERVICAL DECOMPRESSION/DISCECTOMY FUSION 2 LEVELS C5-7.    Patient was given perioperative antibiotics:  Anti-infectives    Start     Dose/Rate Route Frequency Ordered Stop   07/12/14 2030  ceFAZolin (ANCEF) IVPB 1 g/50 mL premix     1 g 100 mL/hr over 30 Minutes Intravenous Every 8 hours 07/12/14 2018 07/13/14 1007   07/12/14 0600  ceFAZolin (ANCEF) IVPB 2 g/50 mL premix     2 g 100 mL/hr over 30 Minutes Intravenous On call to O.R. 07/11/14 1407 07/12/14 1425       Patient was given sequential compression devices, early ambulation to prevent DVT.  Patient benefited maximally from hospital stay and there were no complications.    Recent vital signs: BP 123/82 mmHg  Pulse 89  Temp(Src) 98.8 F (37.1 C) (Axillary)  Resp 17  Ht 4\' 11"  (1.499 m)  Wt 64.184 kg (141 lb 8 oz)  BMI 28.56 kg/m2  SpO2 100%  LMP 07/12/2014  Discharge Medications:     Medication List    STOP taking these medications        acetaminophen 500 MG tablet  Commonly known as:  TYLENOL      TAKE these medications        ALPRAZolam 1 MG tablet  Commonly known as:  XANAX  Take 1 mg by mouth 2 (two) times  daily as needed for anxiety.     diazepam 5 MG tablet  Commonly known as:  VALIUM  Take 1 tablet (5 mg total) by mouth every 6 (six) hours as needed for muscle spasms.     docusate sodium 100 MG capsule  Commonly known as:  COLACE  Take 1 capsule (100 mg total) by mouth 2 (two) times daily.     multivitamin with minerals tablet  Take 1 tablet by mouth daily.     oxyCODONE-acetaminophen 5-325 MG per tablet  Commonly known as:  PERCOCET/ROXICET  Take 1-2 tablets by mouth every 4 (four) hours as needed for moderate pain.     zolpidem 10 MG tablet  Commonly known as:  AMBIEN  Take 10 mg by mouth at bedtime as needed for sleep.        Diagnostic Studies: Dg Chest 2 View  07/10/2014   CLINICAL DATA:  Anxiety.  Hypertension.  EXAM: CHEST  2 VIEW  COMPARISON:  None.  FINDINGS: Mediastinum and hilar structures normal. Lungs are clear. No pleural effusion or pneumothorax. Heart size normal. No acute bony abnormality.  IMPRESSION: No acute cardiopulmonary disease.   Electronically Signed   By: Maisie Fushomas  Register   On: 07/10/2014 14:50   Dg Cervical Spine 1 View  07/12/2014   CLINICAL DATA:  Anterior cervical decompression fusion, cervical C5-6 and  C6-7 level with instrumentation. Allograft for right arm pain.  EXAM: DG C-ARM 61-120 MIN; DG CERVICAL SPINE - 1 VIEW  FLUOROSCOPY TIME:  Radiation Exposure Index (as provided by the fluoroscopic device):  If the device does not provide the exposure index:  Fluoroscopy Time (in minutes and seconds):  0 minutes 3 seconds  Number of Acquired Images:  1  COMPARISON:  07/10/2014  FINDINGS: Examination demonstrates placement of anterior fusion hardware which is intact and appropriately located from C5-C7. Intervertebral cages are present at the C5-6 and C6-7 disc spaces. Operative clinical support apparatus is present.  IMPRESSION: Anterior fusion hardware intact from C5-C7 with intervertebral cages at the C5-6 and C6-7 disc spaces.   Electronically Signed   By:  Elberta Fortis M.D.   On: 07/12/2014 16:58   Dg Cerv Spine Flex&ext Only  07/10/2014   CLINICAL DATA:  Preop exam.  Neck pain  EXAM: CERVICAL SPINE - FLEXION AND EXTENSION VIEWS ONLY  COMPARISON:  Cervical MRI 07/03/2014  FINDINGS: In the neutral position, 1 mm anterior slip C4-5. This increases slightly on flexion and reduces to 1.5 mm retrolisthesis at C4-5 on extension. This indicates mild instability at C4-5.  Slight retrolisthesis C5-6 is unchanged on flexion and extension. There is moderate disc degeneration and spurring at C5-6 and C6-7. C4-5 disc space is normal. Normal C3-4 disc space.  IMPRESSION: Disc degeneration and spondylosis C5-6 and C6-7  Mild movement at C4-5 on flexion and extension.   Electronically Signed   By: Marlan Palau M.D.   On: 07/10/2014 14:52   Dg C-arm 1-60 Min  07/12/2014   CLINICAL DATA:  Anterior cervical decompression fusion, cervical C5-6 and C6-7 level with instrumentation. Allograft for right arm pain.  EXAM: DG C-ARM 61-120 MIN; DG CERVICAL SPINE - 1 VIEW  FLUOROSCOPY TIME:  Radiation Exposure Index (as provided by the fluoroscopic device):  If the device does not provide the exposure index:  Fluoroscopy Time (in minutes and seconds):  0 minutes 3 seconds  Number of Acquired Images:  1  COMPARISON:  07/10/2014  FINDINGS: Examination demonstrates placement of anterior fusion hardware which is intact and appropriately located from C5-C7. Intervertebral cages are present at the C5-6 and C6-7 disc spaces. Operative clinical support apparatus is present.  IMPRESSION: Anterior fusion hardware intact from C5-C7 with intervertebral cages at the C5-6 and C6-7 disc spaces.   Electronically Signed   By: Elberta Fortis M.D.   On: 07/12/2014 16:58    Disposition: 01-Home or Self Care    POD #1 s/p C5-7 ACDF, doing well with resolution of right arm pain  - up with PT/OT, encourage ambulation - Percocet for pain, Valium for muscle spasms -Written scripts for pain signed and  in chart -D/C instructions sheet printed and in chart -D/C today  -F/U in office 2 weeks   Signed: Georga Bora 07/20/2014, 12:39 PM

## 2015-07-31 IMAGING — RF DG C-ARM 61-120 MIN
1 series · 1 of 1 positions shown · non-contrast
Comparison: 07/10/2014

CLINICAL DATA: Anterior cervical decompression fusion, cervical
C5-6 and C6-7 level with instrumentation. Allograft for right arm
pain.

EXAM:
DG C-ARM 61-120 MIN; DG CERVICAL SPINE - 1 VIEW
FLUOROSCOPY TIME:  Radiation Exposure Index (as provided by the
fluoroscopic device):
If the device does not provide the exposure index:
Fluoroscopy Time (in minutes and seconds):  0 minutes 3 seconds
Number of Acquired Images:  1

[Series 1: run · 1 of 1 slices shown]
[im 1/1]
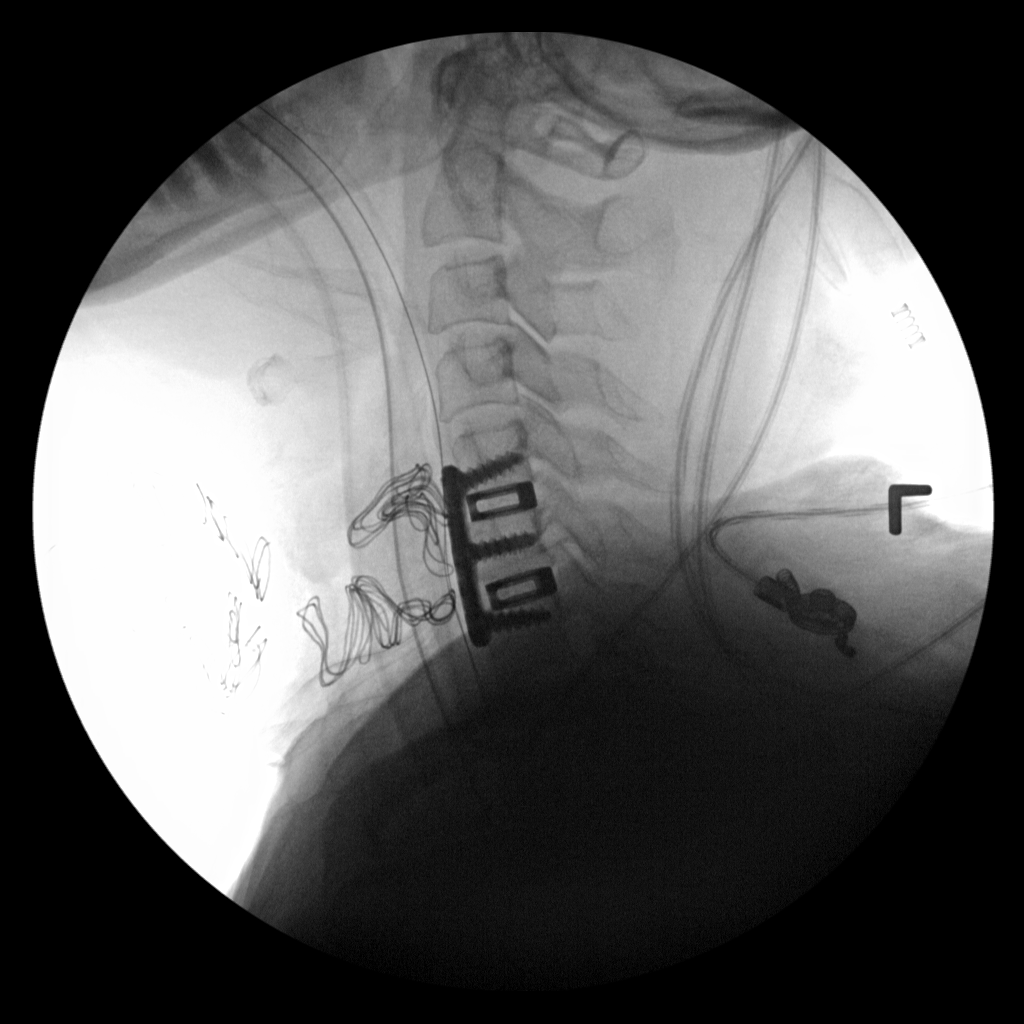

[1 of 1 positions shown; findings below may reference images not displayed]

FINDINGS: Examination demonstrates placement of anterior fusion hardware which
is intact and appropriately located from C5-C7. Intervertebral cages
are present at the C5-6 and C6-7 disc spaces. Operative clinical
support apparatus is present.
IMPRESSION: Anterior fusion hardware intact from C5-C7 with intervertebral cages
at the C5-6 and C6-7 disc spaces.

## 2016-03-14 ENCOUNTER — Other Ambulatory Visit: Payer: Self-pay

## 2016-03-14 ENCOUNTER — Telehealth: Payer: Self-pay | Admitting: General Practice

## 2016-03-14 ENCOUNTER — Ambulatory Visit: Payer: Self-pay | Admitting: Internal Medicine

## 2016-04-04 ENCOUNTER — Ambulatory Visit (INDEPENDENT_AMBULATORY_CARE_PROVIDER_SITE_OTHER): Payer: BLUE CROSS/BLUE SHIELD | Admitting: Internal Medicine

## 2016-04-04 VITALS — BP 128/80 | HR 60 | Temp 98.2°F | Ht <= 58 in | Wt 143.2 lb

## 2016-04-04 DIAGNOSIS — I10 Essential (primary) hypertension: Secondary | ICD-10-CM | POA: Diagnosis not present

## 2016-04-04 DIAGNOSIS — G4700 Insomnia, unspecified: Secondary | ICD-10-CM | POA: Diagnosis not present

## 2016-04-04 DIAGNOSIS — Z780 Asymptomatic menopausal state: Secondary | ICD-10-CM

## 2016-04-04 DIAGNOSIS — H409 Unspecified glaucoma: Secondary | ICD-10-CM | POA: Insufficient documentation

## 2016-04-04 DIAGNOSIS — H269 Unspecified cataract: Secondary | ICD-10-CM | POA: Insufficient documentation

## 2016-04-04 DIAGNOSIS — J302 Other seasonal allergic rhinitis: Secondary | ICD-10-CM | POA: Insufficient documentation

## 2016-04-04 DIAGNOSIS — F411 Generalized anxiety disorder: Secondary | ICD-10-CM | POA: Diagnosis not present

## 2016-04-04 DIAGNOSIS — M5412 Radiculopathy, cervical region: Secondary | ICD-10-CM

## 2016-04-04 MED ORDER — ALPRAZOLAM 1 MG PO TABS
1.0000 mg | ORAL_TABLET | Freq: Two times a day (BID) | ORAL | 1 refills | Status: DC | PRN
Start: 2016-04-04 — End: 2016-04-04

## 2016-04-04 MED ORDER — BUPROPION HCL ER (XL) 150 MG PO TB24: 150.0000 mg | ORAL_TABLET | Freq: Every day | ORAL | 3 refills | Status: DC

## 2016-04-04 MED ORDER — ALPRAZOLAM 1 MG PO TABS: 1.0000 mg | ORAL_TABLET | Freq: Two times a day (BID) | ORAL | 1 refills | Status: DC | PRN

## 2016-04-04 NOTE — Progress Notes (Signed)
Patient ID: Victoria Floyd, female   DOB: 20-Nov-1965, 50 y.o.   MRN: [id number]    Location:  [facility] Place of Service: OFFICE    Advanced Directive information Does patient have an advance directive?: No, Would patient like information on creating an advanced directive?: No - patient declined information  Chief Complaint  Patient presents with  . Establish Care    New patient to establish care  . Medication Management    patient would like to discuss Welbutrin  . Flu Vaccine    refused    HPI:  50 yo female seen today as a new pt. She reports increased stressors since dose of alprazolam reduced in July 2017 to 1 tab daily. She has frequent panic attacks with associated forgetfulness, difficulty concentrating, shaking tremors, SOB, chest tightness. She has taken xanax > 15 yrs at BID dose. She has never had counseling.   She has hot flashes and mood swings. FDLMP >1 yr ago.   Anxiety/insomnia - she takes alprazolam for anxiety and zolpidem for insomnia  HTN - she takes metoprolol  Hx cervical radiculopathy - she is s/p anterior cervical decompression/discectomy fusion at C5-C7 in Feb 2016 by Dr Lynann Bologna. She still has numbness/tingling right middle finger intermittently  Seasonal allergy - stable on flonase  Hx glaucoma and cats - has not seen eye specialist in > 1 yr  Job requires repetitive motions. She is a Furniture conservator/restorer  Old records reviewed. Last PCP visit in July 2017. She was Rx vistaril which she states made her feel agitated and she stopped taking it.  Past Medical History:  Diagnosis Date  . Anxiety   . Hypertension    hx of  . Insomnia     Past Surgical History:  Procedure Laterality Date  . ANTERIOR CERVICAL DECOMP/DISCECTOMY FUSION N/A 07/12/2014   Procedure: ANTERIOR CERVICAL DECOMPRESSION/DISCECTOMY FUSION 2 LEVELS;  Surgeon: Sinclair Ship, MD;  Location: [place];  Service: Orthopedics;  Laterality: N/A;  Anterior cervical decompression fusion, cervical  5-6, cervical 6-7 with instrumentation, allograft  . BUNIONECTOMY Bilateral   . CERVICAL DISC SURGERY  07/12/2014   C5 C6 C7    DR [name]  . TUBAL LIGATION    . WISDOM TOOTH EXTRACTION      Patient Care Team: No Pcp Per Patient as PCP - General (General Practice)  Social History   Social History  . Marital status: Single    Spouse name: N/A  . Number of children: N/A  . Years of education: N/A   Occupational History  . Not on file.   Social History Main Topics  . Smoking status: Former Smoker    Years: 15.00  . Smokeless tobacco: Never Used  . Alcohol use Yes     Comment: occasional  . Drug use: No  . Sexual activity: Not on file   Other Topics Concern  . Not on file   Social History Narrative   DIET: I eat healthy       DO YOU DRINK/EAT THINGS WITH CAFFEINE: Yes      MARITAL STATUS: Divorced      WHAT YEAR WERE YOU MARRIED: 1992      DO YOU LIVE IN A HOUSE, APARTMENT, ASSISTED LIVING, CONDO TRAILER ETC.:  House      IS IT ONE OR MORE STORIES: Yes      HOW MANY PERSONS LIVE IN YOUR HOME: 1      DO YOU HAVE PETS IN YOUR HOME: 2 cats  CURRENT OR PAST PROFESSION: machinist/ EMT      DO YOU EXERCISE: yes      WHAT TYPE AND HOW OFTEN: occasionally        reports that she has quit smoking. She quit after 15.00 years of use. She has never used smokeless tobacco. She reports that she drinks alcohol. She reports that she does not use drugs.  Family History  Problem Relation Age of Onset  . Colon cancer Father    Family Status  Relation Status  . Mother Alive  . Father Deceased  . Son Alive  . Daughter Alive    Immunization History  Administered Date(s) Administered  . Influenza-Unspecified 02/24/2015  . Tdap 02/24/2015    Allergies  Allergen Reactions  . Codeine Itching    Medications: Patient's Medications  New Prescriptions   No medications on file  Previous Medications   ALPRAZOLAM (XANAX) 1 MG TABLET    Take 1 mg by mouth daily  as needed for anxiety.    FLUTICASONE (FLONASE) 50 MCG/ACT NASAL SPRAY    Place 2 sprays into both nostrils daily as needed for allergies or rhinitis.   METOPROLOL (LOPRESSOR) 100 MG TABLET    Take 200 mg by mouth daily.   MULTIPLE VITAMINS-MINERALS (MULTIVITAMIN WITH MINERALS) TABLET    Take 1 tablet by mouth daily.   ZOLPIDEM (AMBIEN) 10 MG TABLET    Take 10 mg by mouth at bedtime as needed for sleep.  Modified Medications   No medications on file  Discontinued Medications   DIAZEPAM (VALIUM) 5 MG TABLET    Take 1 tablet (5 mg total) by mouth every 6 (six) hours as needed for muscle spasms.   DOCUSATE SODIUM (COLACE) 100 MG CAPSULE    Take 1 capsule (100 mg total) by mouth 2 (two) times daily.   OXYCODONE-ACETAMINOPHEN (PERCOCET/ROXICET) 5-325 MG PER TABLET    Take 1-2 tablets by mouth every 4 (four) hours as needed for moderate pain.    Review of Systems  Constitutional: Positive for diaphoresis.  HENT: Positive for sinus pressure and tinnitus.   Eyes: Positive for visual disturbance (due to glaucoma and cats; last eye exam > 1 yr ago).       "hole" in retina  Endocrine:       Hot flashes  Musculoskeletal: Positive for neck pain.  Psychiatric/Behavioral: Positive for decreased concentration and sleep disturbance. The patient is nervous/anxious.   All other systems reviewed and are negative.   Vitals:   04/04/16 0819  BP: 128/80  Pulse: 60  Temp: 98.2 F (36.8 C)  TempSrc: Oral  SpO2: 98%  Weight: 143 lb 3.2 oz (65 kg)  Height: '4\' 10"'$  (1.473 m)   Body mass index is 29.93 kg/m.  Physical Exam  Constitutional: She is oriented to person, place, and time. She appears well-developed and well-nourished.  HENT:  Mouth/Throat: Oropharynx is clear and moist. No oropharyngeal exudate.  Eyes: Pupils are equal, round, and reactive to light. No scleral icterus.  Neck: Neck supple. Carotid bruit is not present. No tracheal deviation present. No thyromegaly present.  Cardiovascular:  Normal rate, regular rhythm, normal heart sounds and intact distal pulses.  Exam reveals no gallop and no friction rub.   No murmur heard. No LE edema b/l. no calf TTP.   Pulmonary/Chest: Effort normal and breath sounds normal. No stridor. No respiratory distress. She has no wheezes. She has no rales.  Abdominal: Soft. Bowel sounds are normal. She exhibits no distension and no mass. There  is no hepatomegaly. There is no tenderness. There is no rebound and no guarding.  Lymphadenopathy:    She has no cervical adenopathy.  Neurological: She is alert and oriented to person, place, and time. She has normal reflexes.  Skin: Skin is warm and dry. No rash noted.  Psychiatric: Her speech is normal and behavior is normal. Judgment and thought content normal. Her mood appears anxious.     Labs reviewed: No visits with results within 3 Month(s) from this visit.  Latest known visit with results is:  Hospital Outpatient Visit on 07/10/2014  Component Date Value Ref Range Status  . Preg, Serum 07/10/2014 NEGATIVE  NEGATIVE Final   Comment:        THE SENSITIVITY OF THIS METHODOLOGY IS >10 mIU/mL.   . MRSA, PCR 07/10/2014 NEGATIVE  NEGATIVE Final  . Staphylococcus aureus 07/10/2014 NEGATIVE  NEGATIVE Final   Comment:        The Xpert SA Assay (FDA approved for NASAL specimens in patients over 61 years of age), is one component of a comprehensive surveillance program.  Test performance has been validated by [facility] for patients greater than or equal to 35 year old. It is not intended to diagnose infection nor to guide or monitor treatment.   Marland Kitchen aPTT 07/10/2014 34  24 - 37 seconds Final  . WBC 07/10/2014 4.4  4.0 - 10.5 K/uL Final  . RBC 07/10/2014 4.10  3.87 - 5.11 MIL/uL Final  . Hemoglobin 07/10/2014 11.6* 12.0 - 15.0 g/dL Final  . HCT 07/10/2014 35.6* 36.0 - 46.0 % Final  . MCV 07/10/2014 86.8  78.0 - 100.0 fL Final  . MCH 07/10/2014 28.3  26.0 - 34.0 pg Final  . MCHC 07/10/2014  32.6  30.0 - 36.0 g/dL Final  . RDW 07/10/2014 17.7* 11.5 - 15.5 % Final  . Platelets 07/10/2014 297  150 - 400 K/uL Final  . Neutrophils Relative % 07/10/2014 61  43 - 77 % Final  . Neutro Abs 07/10/2014 2.8  1.7 - 7.7 K/uL Final  . Lymphocytes Relative 07/10/2014 30  12 - 46 % Final  . Lymphs Abs 07/10/2014 1.3  0.7 - 4.0 K/uL Final  . Monocytes Relative 07/10/2014 7  3 - 12 % Final  . Monocytes Absolute 07/10/2014 0.3  0.1 - 1.0 K/uL Final  . Eosinophils Relative 07/10/2014 1  0 - 5 % Final  . Eosinophils Absolute 07/10/2014 0.0  0.0 - 0.7 K/uL Final  . Basophils Relative 07/10/2014 1  0 - 1 % Final  . Basophils Absolute 07/10/2014 0.0  0.0 - 0.1 K/uL Final  . Sodium 07/10/2014 137  135 - 145 mmol/L Final  . Potassium 07/10/2014 3.6  3.5 - 5.1 mmol/L Final  . Chloride 07/10/2014 105  96 - 112 mmol/L Final  . CO2 07/10/2014 22  19 - 32 mmol/L Final  . Glucose, Bld 07/10/2014 102* 70 - 99 mg/dL Final  . BUN 07/10/2014 7  6 - 23 mg/dL Final  . Creatinine, Ser 07/10/2014 0.96  0.50 - 1.10 mg/dL Final  . Calcium 07/10/2014 9.6  8.4 - 10.5 mg/dL Final  . Total Protein 07/10/2014 7.7  6.0 - 8.3 g/dL Final  . Albumin 07/10/2014 4.5  3.5 - 5.2 g/dL Final  . AST 07/10/2014 20  0 - 37 U/L Final  . ALT 07/10/2014 14  0 - 35 U/L Final  . Alkaline Phosphatase 07/10/2014 58  39 - 117 U/L Final  . Total Bilirubin 07/10/2014 0.4  0.3 - 1.2 mg/dL Final  . GFR calc non Af Amer 07/10/2014 69* >90 mL/min Final  . GFR calc Af Amer 07/10/2014 80* >90 mL/min Final   Comment: (NOTE) The eGFR has been calculated using the CKD EPI equation. This calculation has not been validated in all clinical situations. eGFR's persistently <90 mL/min signify possible Chronic Kidney Disease.   . Anion gap 07/10/2014 10  5 - 15 Final  . Prothrombin Time 07/10/2014 13.7  11.6 - 15.2 seconds Final  . INR 07/10/2014 1.04  0.00 - 1.49 Final  . Color, Urine 07/10/2014 YELLOW  YELLOW Final  . APPearance 07/10/2014 CLEAR   CLEAR Final  . Specific Gravity, Urine 07/10/2014 1.028  1.005 - 1.030 Final  . pH 07/10/2014 5.0  5.0 - 8.0 Final  . Glucose, UA 07/10/2014 NEGATIVE  NEGATIVE mg/dL Final  . Hgb urine dipstick 07/10/2014 NEGATIVE  NEGATIVE Final  . Bilirubin Urine 07/10/2014 SMALL* NEGATIVE Final  . Ketones, ur 07/10/2014 NEGATIVE  NEGATIVE mg/dL Final  . Protein, ur 07/10/2014 NEGATIVE  NEGATIVE mg/dL Final  . Urobilinogen, UA 07/10/2014 0.2  0.0 - 1.0 mg/dL Final  . Nitrite 07/10/2014 NEGATIVE  NEGATIVE Final  . Leukocytes, UA 07/10/2014 NEGATIVE  NEGATIVE Final    No results found.   Assessment/Plan   ICD-9-CM ICD-10-CM   1. GAD (generalized anxiety disorder) 300.02 F41.1 buPROPion (WELLBUTRIN XL) 150 MG 24 hr tablet     ALPRAZolam (XANAX) 1 MG tablet     DISCONTINUED: ALPRAZolam (XANAX) 1 MG tablet  2. Insomnia, unspecified type 780.52 G47.00   3. Essential hypertension 401.9 I10   4. Post-menopausal V49.81 Z78.0   5. Cervical radiculopathy 723.4 M54.12      May take 1.5 tabs of alprazolam daily as needed for anxiety  Increase wellbutrin xl to 2 tabs daily. If anxiety remains unchanged, will increase alprazolam back to 2 tabs daily  Recommend you seek counseling regarding anxiety. List of providers given.  She declined flu shot today  Continue other medications as ordered  Follow up in 2-3 mos for CPE/ECG   Dorian S. Perlie Gold  [facility] [address] [place], [place] [address] [phone] Cell (Monday-Friday 8 AM - 5 PM) [phone] After 5 PM and follow prompts

## 2016-04-04 NOTE — Patient Instructions (Signed)
May take 1.5 tabs of alprazolam daily as needed for anxiety  Recommend you seek counseling regarding anxiety  Continue other medications as ordered  Follow up in 2-3 mos for CPE/ECG

## 2016-06-16 ENCOUNTER — Other Ambulatory Visit: Payer: Self-pay | Admitting: *Deleted

## 2016-06-16 DIAGNOSIS — F411 Generalized anxiety disorder: Secondary | ICD-10-CM

## 2016-06-16 MED ORDER — ALPRAZOLAM 1 MG PO TABS: 1.0000 mg | ORAL_TABLET | Freq: Two times a day (BID) | ORAL | 0 refills | Status: DC | PRN

## 2016-06-16 NOTE — Telephone Encounter (Signed)
Patient requested to be sent to pharmacy. Has an appointment for 07/04/16.

## 2016-07-04 ENCOUNTER — Encounter: Payer: Self-pay | Admitting: Internal Medicine

## 2016-07-04 ENCOUNTER — Ambulatory Visit (INDEPENDENT_AMBULATORY_CARE_PROVIDER_SITE_OTHER): Payer: Managed Care, Other (non HMO) | Admitting: Internal Medicine

## 2016-07-04 VITALS — BP 100/68 | HR 53 | Temp 97.7°F | Ht 59.5 in | Wt 141.6 lb

## 2016-07-04 DIAGNOSIS — J302 Other seasonal allergic rhinitis: Secondary | ICD-10-CM

## 2016-07-04 DIAGNOSIS — Z1211 Encounter for screening for malignant neoplasm of colon: Secondary | ICD-10-CM | POA: Diagnosis not present

## 2016-07-04 DIAGNOSIS — I10 Essential (primary) hypertension: Secondary | ICD-10-CM | POA: Diagnosis not present

## 2016-07-04 DIAGNOSIS — F411 Generalized anxiety disorder: Secondary | ICD-10-CM

## 2016-07-04 DIAGNOSIS — Z780 Asymptomatic menopausal state: Secondary | ICD-10-CM

## 2016-07-04 DIAGNOSIS — K5900 Constipation, unspecified: Secondary | ICD-10-CM

## 2016-07-04 DIAGNOSIS — G5601 Carpal tunnel syndrome, right upper limb: Secondary | ICD-10-CM

## 2016-07-04 DIAGNOSIS — M5412 Radiculopathy, cervical region: Secondary | ICD-10-CM

## 2016-07-04 DIAGNOSIS — G4700 Insomnia, unspecified: Secondary | ICD-10-CM | POA: Diagnosis not present

## 2016-07-04 MED ORDER — METHOCARBAMOL 500 MG PO TABS: 500.0000 mg | ORAL_TABLET | Freq: Two times a day (BID) | ORAL | 6 refills | Status: DC

## 2016-07-04 MED ORDER — ZOLPIDEM TARTRATE 10 MG PO TABS
10.0000 mg | ORAL_TABLET | Freq: Every evening | ORAL | 3 refills | Status: DC | PRN
Start: 2016-07-04 — End: 2016-11-21

## 2016-07-04 MED ORDER — BUPROPION HCL ER (XL) 150 MG PO TB24: 150.0000 mg | ORAL_TABLET | Freq: Two times a day (BID) | ORAL | 6 refills | Status: DC

## 2016-07-04 MED ORDER — GABAPENTIN 300 MG PO CAPS
300.0000 mg | ORAL_CAPSULE | Freq: Two times a day (BID) | ORAL | 6 refills | Status: DC
Start: 2016-07-04 — End: 2016-11-21

## 2016-07-04 MED ORDER — FLUTICASONE PROPIONATE 50 MCG/ACT NA SUSP
2.0000 | Freq: Every day | NASAL | 11 refills | Status: DC | PRN
Start: 2016-07-04 — End: 2017-08-24

## 2016-07-04 MED ORDER — ALPRAZOLAM 1 MG PO TABS: 1.0000 mg | ORAL_TABLET | Freq: Two times a day (BID) | ORAL | 1 refills | Status: DC | PRN

## 2016-07-04 NOTE — Progress Notes (Signed)
Patient ID: Victoria Floyd, female   DOB: February 28, 1966, 51 y.o.   MRN: [id number]    Location:  [facility] Place of Service: OFFICE  Chief Complaint  Patient presents with  . Medical Management of Chronic Issues    3 month follow-up, will schedule CPX in near future   . Medication Refill    Flonase, Ambien, Xanax, and Wellbutrin   . Medication Management    Request RX for Methocarbamol and Gabapentin for hand swelling and pain related to physical labor     HPI:  51 yo female seen today for f/u. She requests robaxin and gabapentin to help relieve pain and swelling in hands. She has taken them both after cervical sx with success. She needs med RF also.  She has c/a constipation and flatulence. She tried OTC miralax which works but causes stomach to gurgle and she belches a lot. No bloody stools. Father had hx colon CA (dx at age 57)  increased stressors since dose of alprazolam reduced in July 2017 to 1 tab daily. She has frequent panic attacks with associated forgetfulness, difficulty concentrating, shaking tremors, SOB, chest tightness. She has taken xanax > 15 yrs at BID dose. She has never had counseling. Weight down 2 lbs with BMI 28.12  She has hot flashes and mood swings. FDLMP >1 yr ago.  Improved with wellbutrin  Anxiety/insomnia - she takes alprazolam for anxiety and zolpidem for insomnia  HTN - she takes metoprolol  Hx cervical radiculopathy - she is s/p anterior cervical decompression/discectomy fusion at C5-C7 in Feb 2016 by Dr Lynann Bologna. She still has numbness/tingling right middle finger intermittently  Seasonal allergy - stable on flonase  Hx glaucoma and cats - has not seen eye specialist in > 1 yr  Job requires repetitive motions. She is a Furniture conservator/restorer  Old records reviewed. Last PCP visit in July 2017. She was Rx vistaril which she states made her feel agitated and she stopped taking it.  Past Medical History:  Diagnosis Date  . Anxiety   . Hypertension    hx of  .  Insomnia     Past Surgical History:  Procedure Laterality Date  . ANTERIOR CERVICAL DECOMP/DISCECTOMY FUSION N/A 07/12/2014   Procedure: ANTERIOR CERVICAL DECOMPRESSION/DISCECTOMY FUSION 2 LEVELS;  Surgeon: Sinclair Ship, MD;  Location: [facility];  Service: Orthopedics;  Laterality: N/A;  Anterior cervical decompression fusion, cervical 5-6, cervical 6-7 with instrumentation, allograft  . BUNIONECTOMY Bilateral   . CERVICAL DISC SURGERY  07/12/2014   C5 C6 C7    DR [name]  . TUBAL LIGATION    . WISDOM TOOTH EXTRACTION      Patient Care Team: No Pcp Per Patient as PCP - General (General Practice)  Social History   Social History  . Marital status: Divorced    Spouse name: N/A  . Number of children: N/A  . Years of education: N/A   Occupational History  . Not on file.   Social History Main Topics  . Smoking status: Former Smoker    Years: 15.00  . Smokeless tobacco: Never Used  . Alcohol use Yes     Comment: occasional  . Drug use: No  . Sexual activity: Not on file   Other Topics Concern  . Not on file   Social History Narrative   DIET: I eat healthy       DO YOU DRINK/EAT THINGS WITH CAFFEINE: Yes      MARITAL STATUS: Divorced      WHAT YEAR WERE  YOU MARRIED: 1992      DO YOU LIVE IN A HOUSE, APARTMENT, ASSISTED LIVING, CONDO TRAILER ETC.:  House      IS IT ONE OR MORE STORIES: Yes      HOW MANY PERSONS LIVE IN YOUR HOME: 1      DO YOU HAVE PETS IN YOUR HOME: 2 cats      CURRENT OR PAST PROFESSION: machinist/ EMT      DO YOU EXERCISE: yes      WHAT TYPE AND HOW OFTEN: occasionally        reports that she has quit smoking. She quit after 15.00 years of use. She has never used smokeless tobacco. She reports that she drinks alcohol. She reports that she does not use drugs.  Family History  Problem Relation Age of Onset  . Colon cancer Father    Family Status  Relation Status  . Mother Alive  . Father Deceased  . Son Alive  . Daughter Alive      Allergies  Allergen Reactions  . Codeine Itching    Medications: Patient's Medications  New Prescriptions   No medications on file  Previous Medications   ALPRAZOLAM (XANAX) 1 MG TABLET    Take 1 tablet (1 mg total) by mouth 2 (two) times daily as needed for anxiety.   BUPROPION (WELLBUTRIN XL) 150 MG 24 HR TABLET    Take 1 tablet (150 mg total) by mouth daily.   FLUTICASONE (FLONASE) 50 MCG/ACT NASAL SPRAY    Place 2 sprays into both nostrils daily as needed for allergies or rhinitis.   METOPROLOL (LOPRESSOR) 100 MG TABLET    Take 200 mg by mouth daily.   MULTIPLE VITAMINS-MINERALS (MULTIVITAMIN WITH MINERALS) TABLET    Take 1 tablet by mouth daily.   ZOLPIDEM (AMBIEN) 10 MG TABLET    Take 10 mg by mouth at bedtime as needed for sleep.  Modified Medications   No medications on file  Discontinued Medications   No medications on file    Review of Systems  Vitals:   07/04/16 1500  BP: 100/68  Pulse: (!) 53  Temp: 97.7 F (36.5 C)  TempSrc: Oral  SpO2: 98%  Weight: 141 lb 9.6 oz (64.2 kg)  Height: 4' 11.5" (1.511 m)   Body mass index is 28.12 kg/m.  Physical Exam  Constitutional: She is oriented to person, place, and time. She appears well-developed and well-nourished.  HENT:  Mouth/Throat: Oropharynx is clear and moist. No oropharyngeal exudate.  Eyes: Pupils are equal, round, and reactive to light. No scleral icterus.  Neck: Neck supple. Carotid bruit is not present. No tracheal deviation present.  Cardiovascular: Normal rate, regular rhythm, normal heart sounds and intact distal pulses.  Exam reveals no gallop and no friction rub.   No murmur heard. No LE edema b/l. no calf TTP.   Pulmonary/Chest: Effort normal and breath sounds normal. No stridor. No respiratory distress. She has no wheezes. She has no rales.  Abdominal: Soft. Bowel sounds are normal. She exhibits distension. She exhibits no mass. There is no hepatomegaly. There is no tenderness. There is no  rebound and no guarding.  Musculoskeletal: She exhibits edema (small joints of fingers) and tenderness.  Right (+) Tinel's sign; neg on left  Lymphadenopathy:    She has no cervical adenopathy.  Neurological: She is alert and oriented to person, place, and time.  Skin: Skin is warm and dry. No rash noted.  Psychiatric: She has a normal mood and affect.  Her speech is normal and behavior is normal. Judgment and thought content normal. Her mood appears not anxious.     Labs reviewed: No visits with results within 3 Month(s) from this visit.  Latest known visit with results is:  Hospital Outpatient Visit on 07/10/2014  Component Date Value Ref Range Status  . Preg, Serum 07/10/2014 NEGATIVE  NEGATIVE Final   Comment:        THE SENSITIVITY OF THIS METHODOLOGY IS >10 mIU/mL.   . MRSA, PCR 07/10/2014 NEGATIVE  NEGATIVE Final  . Staphylococcus aureus 07/10/2014 NEGATIVE  NEGATIVE Final   Comment:        The Xpert SA Assay (FDA approved for NASAL specimens in patients over 79 years of age), is one component of a comprehensive surveillance program.  Test performance has been validated by [facility] for patients greater than or equal to 57 year old. It is not intended to diagnose infection nor to guide or monitor treatment.   Marland Kitchen aPTT 07/10/2014 34  24 - 37 seconds Final  . WBC 07/10/2014 4.4  4.0 - 10.5 K/uL Final  . RBC 07/10/2014 4.10  3.87 - 5.11 MIL/uL Final  . Hemoglobin 07/10/2014 11.6* 12.0 - 15.0 g/dL Final  . HCT 07/10/2014 35.6* 36.0 - 46.0 % Final  . MCV 07/10/2014 86.8  78.0 - 100.0 fL Final  . MCH 07/10/2014 28.3  26.0 - 34.0 pg Final  . MCHC 07/10/2014 32.6  30.0 - 36.0 g/dL Final  . RDW 07/10/2014 17.7* 11.5 - 15.5 % Final  . Platelets 07/10/2014 297  150 - 400 K/uL Final  . Neutrophils Relative % 07/10/2014 61  43 - 77 % Final  . Neutro Abs 07/10/2014 2.8  1.7 - 7.7 K/uL Final  . Lymphocytes Relative 07/10/2014 30  12 - 46 % Final  . Lymphs Abs 07/10/2014 1.3   0.7 - 4.0 K/uL Final  . Monocytes Relative 07/10/2014 7  3 - 12 % Final  . Monocytes Absolute 07/10/2014 0.3  0.1 - 1.0 K/uL Final  . Eosinophils Relative 07/10/2014 1  0 - 5 % Final  . Eosinophils Absolute 07/10/2014 0.0  0.0 - 0.7 K/uL Final  . Basophils Relative 07/10/2014 1  0 - 1 % Final  . Basophils Absolute 07/10/2014 0.0  0.0 - 0.1 K/uL Final  . Sodium 07/10/2014 137  135 - 145 mmol/L Final  . Potassium 07/10/2014 3.6  3.5 - 5.1 mmol/L Final  . Chloride 07/10/2014 105  96 - 112 mmol/L Final  . CO2 07/10/2014 22  19 - 32 mmol/L Final  . Glucose, Bld 07/10/2014 102* 70 - 99 mg/dL Final  . BUN 07/10/2014 7  6 - 23 mg/dL Final  . Creatinine, Ser 07/10/2014 0.96  0.50 - 1.10 mg/dL Final  . Calcium 07/10/2014 9.6  8.4 - 10.5 mg/dL Final  . Total Protein 07/10/2014 7.7  6.0 - 8.3 g/dL Final  . Albumin 07/10/2014 4.5  3.5 - 5.2 g/dL Final  . AST 07/10/2014 20  0 - 37 U/L Final  . ALT 07/10/2014 14  0 - 35 U/L Final  . Alkaline Phosphatase 07/10/2014 58  39 - 117 U/L Final  . Total Bilirubin 07/10/2014 0.4  0.3 - 1.2 mg/dL Final  . GFR calc non Af Amer 07/10/2014 69* >90 mL/min Final  . GFR calc Af Amer 07/10/2014 80* >90 mL/min Final   Comment: (NOTE) The eGFR has been calculated using the CKD EPI equation. This calculation has not been validated in all clinical  situations. eGFR's persistently <90 mL/min signify possible Chronic Kidney Disease.   . Anion gap 07/10/2014 10  5 - 15 Final  . Prothrombin Time 07/10/2014 13.7  11.6 - 15.2 seconds Final  . INR 07/10/2014 1.04  0.00 - 1.49 Final  . Color, Urine 07/10/2014 YELLOW  YELLOW Final  . APPearance 07/10/2014 CLEAR  CLEAR Final  . Specific Gravity, Urine 07/10/2014 1.028  1.005 - 1.030 Final  . pH 07/10/2014 5.0  5.0 - 8.0 Final  . Glucose, UA 07/10/2014 NEGATIVE  NEGATIVE mg/dL Final  . Hgb urine dipstick 07/10/2014 NEGATIVE  NEGATIVE Final  . Bilirubin Urine 07/10/2014 SMALL* NEGATIVE Final  . Ketones, ur 07/10/2014 NEGATIVE   NEGATIVE mg/dL Final  . Protein, ur 07/10/2014 NEGATIVE  NEGATIVE mg/dL Final  . Urobilinogen, UA 07/10/2014 0.2  0.0 - 1.0 mg/dL Final  . Nitrite 07/10/2014 NEGATIVE  NEGATIVE Final  . Leukocytes, UA 07/10/2014 NEGATIVE  NEGATIVE Final    No results found.   Assessment/Plan   ICD-9-CM ICD-10-CM   1. Cervical radiculopathy 723.4 M54.12 gabapentin (NEURONTIN) 300 MG capsule     methocarbamol (ROBAXIN) 500 MG tablet  2. Right carpal tunnel syndrome 354.0 G56.01 gabapentin (NEURONTIN) 300 MG capsule     methocarbamol (ROBAXIN) 500 MG tablet  3. GAD (generalized anxiety disorder) 300.02 F41.1 ALPRAZolam (XANAX) 1 MG tablet     buPROPion (WELLBUTRIN XL) 150 MG 24 hr tablet  4. Post-menopausal V49.81 Z78.0   5. Essential hypertension 401.9 I10   6. Insomnia, unspecified type 780.52 G47.00 zolpidem (AMBIEN) 10 MG tablet  7. Chronic seasonal allergic rhinitis, unspecified trigger 477.8 J30.2 fluticasone (FLONASE) 50 MCG/ACT nasal spray  8. Colon cancer screening V76.51 Z12.11 Ambulatory referral to Gastroenterology  9. Constipation, unspecified constipation type - change in bowel habits 564.00 K59.00    T/c Ortho eval for CTS. She declines at this time. Will likely need EMG/NCS  START GABAPENTIN '300mg'$  - take 1 capsule daily x 7 days then increase to 2 times daily for neuropathy  START METHOCARBAMOL '500mg'$  take 2 tablets daily for spasms  Continue other medications as ordered  Will call with GI referral for colonoscopy  Samples of Linzess 72 mcg (take 1 capsule daily) AND samples of Amitiza 49mg 9take 1 capsule 2 times daily) given for constipation  Follow up in 4 mos for CPE/ECG  Hellen S. CPerlie Gold PMer[facility]and Adult [facility] [address]GArkoma Glen [place] [phone] (Monday-Friday 8 AM - 5 PM) ([phone] 5 PM and follow prompts

## 2016-07-04 NOTE — Patient Instructions (Addendum)
START GABAPENTIN 300mg  - take 1 capsule daily x 7 days then increase to 2 times daily for neuropathy  START METHOCARBAMOL 500mg  take 2 tablets daily for spasms  Continue other medications as ordered  Will call with GI referral for colonoscopy  Samples of Linzess 72 mcg (take 1 capsule daily) AND samples of Amitiza 8mcg 9take 1 capsule 2 times daily) given for constipation  Follow up in 4 mos for CPE/ECG

## 2016-07-07 ENCOUNTER — Encounter: Payer: Self-pay | Admitting: Internal Medicine

## 2016-07-08 ENCOUNTER — Telehealth: Payer: Self-pay | Admitting: *Deleted

## 2016-07-08 NOTE — Telephone Encounter (Signed)
Patient called and stated that she needs a Note stating that she cannot use the Vise Grips due to hand pain and swelling. Boss stated that she doesn't have to do that job as long as she has a note from the Dr.  Please Advise.   Can Leave message on voicemail when ready and Victoria Floyd (mother) may pick up.

## 2016-07-08 NOTE — Telephone Encounter (Signed)
Letter printed and place in Dr. Celene Skeenarter's folder to review and sign.

## 2016-07-08 NOTE — Telephone Encounter (Signed)
Ok for work note stating below request

## 2016-07-28 ENCOUNTER — Telehealth: Payer: Self-pay | Admitting: Gastroenterology

## 2016-07-28 ENCOUNTER — Ambulatory Visit (AMBULATORY_SURGERY_CENTER): Payer: Self-pay | Admitting: *Deleted

## 2016-07-28 VITALS — Ht <= 58 in | Wt 138.0 lb

## 2016-07-28 DIAGNOSIS — Z1211 Encounter for screening for malignant neoplasm of colon: Secondary | ICD-10-CM

## 2016-07-28 DIAGNOSIS — Z800 Family history of malignant neoplasm of digestive organs: Secondary | ICD-10-CM

## 2016-07-28 MED ORDER — NA SULFATE-K SULFATE-MG SULF 17.5-3.13-1.6 GM/177ML PO SOLN: 1.0000 | Freq: Once | ORAL | 0 refills | Status: AC

## 2016-07-28 MED ORDER — SUPREP BOWEL PREP KIT 17.5-3.13-1.6 GM/177ML PO SOLN: 1.0000 | Freq: Once | ORAL | 0 refills | Status: AC

## 2016-07-28 NOTE — Progress Notes (Signed)
No egg or soy allergy known to patient  No issues with past sedation with any surgeries  or procedures, no intubation problems  No diet pills per patient No home 02 use per patient  No blood thinners per patient  Pt denies issues with constipation --chronic issues with constipation per pt- she uses OTC gas pills and miralax- uses on the weekend stools are not always hard per pt. - pt stools very rare - if she doesn't use meds will not have a BM- will do a 2 day prep - instructed pt to use 1 cap ful of miralax once a day starting 5 days before the colon and to call if doing 2 day prep and  by Sunday pm still having solid stools  No A fib or A flutter   emmi video declined

## 2016-08-11 ENCOUNTER — Ambulatory Visit (AMBULATORY_SURGERY_CENTER): Payer: 59 | Admitting: Gastroenterology

## 2016-08-11 ENCOUNTER — Encounter: Payer: Self-pay | Admitting: Gastroenterology

## 2016-08-11 VITALS — BP 118/59 | HR 54 | Temp 97.7°F | Resp 19 | Ht 59.0 in | Wt 138.0 lb

## 2016-08-11 DIAGNOSIS — Z1211 Encounter for screening for malignant neoplasm of colon: Secondary | ICD-10-CM | POA: Diagnosis not present

## 2016-08-11 DIAGNOSIS — Z800 Family history of malignant neoplasm of digestive organs: Secondary | ICD-10-CM

## 2016-08-11 MED ORDER — SODIUM CHLORIDE 0.9 % IV SOLN: 500.0000 mL | INTRAVENOUS | Status: DC

## 2016-08-11 NOTE — Op Note (Signed)
[facility] Patient Name: Victoria Floyd Procedure Date: 08/11/2016 3:12 PM MRN: [id number] Endoscopist: Napoleon FormKavitha V. Korine Winton , MD Age: 51 Referring MD:  Date of Birth: Mar 18, 1966 Gender: Female Account #: [id number] Procedure:                Colonoscopy Indications:              Screening patient at increased risk: Family history                            of 1st-degree relative with colorectal cancer at                            age 51 years (or older) Medicines:                Monitored Anesthesia Care Procedure:                Pre-Anesthesia Assessment:                           - Prior to the procedure, a History and Physical                            was performed, and patient medications and                            allergies were reviewed. The patient's tolerance of                            previous anesthesia was also reviewed. The risks                            and benefits of the procedure and the sedation                            options and risks were discussed with the patient.                            All questions were answered, and informed consent                            was obtained. Prior Anticoagulants: The patient has                            taken no previous anticoagulant or antiplatelet                            agents. ASA Grade Assessment: II - A patient with                            mild systemic disease. After reviewing the risks                            and benefits, the patient was deemed in  satisfactory condition to undergo the procedure.                           After obtaining informed consent, the colonoscope                            was passed under direct vision. Throughout the                            procedure, the patient's blood pressure, pulse, and                            oxygen saturations were monitored continuously. The                            Colonoscope was introduced  through the anus and                            advanced to the the cecum, identified by                            appendiceal orifice and ileocecal valve. The                            colonoscopy was performed without difficulty. The                            patient tolerated the procedure well. The quality                            of the bowel preparation was evaluated using the                            BBPS [facility] Bowel Preparation Scale) with scores                            of: Right Colon = 2 (minor amount of residual                            staining, small fragments of stool and/or opaque                            liquid, but mucosa seen well), Transverse Colon = 2                            (minor amount of residual staining, small fragments                            of stool and/or opaque liquid, but mucosa seen                            well) and Left Colon = 2 (minor amount of residual  staining, small fragments of stool and/or opaque                            liquid, but mucosa seen well). The total BBPS score                            equals 6. The quality of the bowel preparation was                            adequate. The ileocecal valve, appendiceal orifice,                            and rectum were photographed. Scope In: 3:30:29 PM Scope Out: 3:49:57 PM Scope Withdrawal Time: 0 hours 13 minutes 38 seconds  Total Procedure Duration: 0 hours 19 minutes 28 seconds  Findings:                 The perianal and digital rectal examinations were                            normal.                           Scattered small and large-mouthed diverticula were                            found in the sigmoid colon, descending colon and                            transverse colon.                           The exam was otherwise without abnormality. Complications:            No immediate complications. Estimated Blood Loss:     Estimated  blood loss: none. Impression:               - Diverticulosis in the sigmoid colon, in the                            descending colon and in the transverse colon.                           - The examination was otherwise normal.                           - No specimens collected. Recommendation:           - Patient has a contact number available for                            emergencies. The signs and symptoms of potential                            delayed complications were discussed with the  patient. Return to normal activities tomorrow.                            Written discharge instructions were provided to the                            patient.                           - Resume previous diet.                           - Continue present medications.                           - Repeat colonoscopy in 5 years for screening                            purposes. Napoleon Form, MD 08/11/2016 3:55:06 PM This report has been signed electronically.

## 2016-08-11 NOTE — Patient Instructions (Signed)
YOU HAD AN ENDOSCOPIC PROCEDURE TODAY AT THE [facility]:   Refer to the procedure report that was given to you for any specific questions about what was found during the examination.  If the procedure report does not answer your questions, please call your gastroenterologist to clarify.  If you requested that your care partner not be given the details of your procedure findings, then the procedure report has been included in a sealed envelope for you to review at your convenience later.  YOU SHOULD EXPECT: Some feelings of bloating in the abdomen. Passage of more gas than usual.  Walking can help get rid of the air that was put into your GI tract during the procedure and reduce the bloating. If you had a lower endoscopy (such as a colonoscopy or flexible sigmoidoscopy) you may notice spotting of blood in your stool or on the toilet paper. If you underwent a bowel prep for your procedure, you may not have a normal bowel movement for a few days.  Please Note:  You might notice some irritation and congestion in your nose or some drainage.  This is from the oxygen used during your procedure.  There is no need for concern and it should clear up in a day or so.  SYMPTOMS TO REPORT IMMEDIATELY:   Following lower endoscopy (colonoscopy or flexible sigmoidoscopy):  Excessive amounts of blood in the stool  Significant tenderness or worsening of abdominal pains  Swelling of the abdomen that is new, acute  Fever of 100F or higher  For urgent or emergent issues, a gastroenterologist can be reached at any hour by calling [phone].  DIET:  We do recommend a small meal at first, but then you may proceed to your regular diet.  Drink plenty of fluids but you should avoid alcoholic beverages for 24 hours.  ACTIVITY:  You should plan to take it easy for the rest of today and you should NOT DRIVE or use heavy machinery until tomorrow (because of the sedation medicines used during the test).     FOLLOW UP: Our staff will call the number listed on your records the next business day following your procedure to check on you and address any questions or concerns that you may have regarding the information given to you following your procedure. If we do not reach you, we will leave a message.  However, if you are feeling well and you are not experiencing any problems, there is no need to return our call.  We will assume that you have returned to your regular daily activities without incident.   SIGNATURES/CONFIDENTIALITY: You and/or your care partner have signed paperwork which will be entered into your electronic medical record.  These signatures attest to the fact that that the information above on your After Visit Summary has been reviewed and is understood.  Full responsibility of the confidentiality of this discharge information lies with you and/or your care-partner.  Next colonoscopy- 5 years  Please read over handout about diverticulosis   Please continue your normal medications

## 2016-08-11 NOTE — Progress Notes (Signed)
Pt's states no medical or surgical changes since previsit or office visit. 

## 2016-08-11 NOTE — Progress Notes (Signed)
A/ox3 pleased with MAC, report to Kristen RN 

## 2016-08-12 ENCOUNTER — Telehealth: Payer: Self-pay

## 2016-08-12 ENCOUNTER — Telehealth: Payer: Self-pay | Admitting: *Deleted

## 2016-08-12 NOTE — Telephone Encounter (Signed)
Left message on answering machine. 

## 2016-08-12 NOTE — Telephone Encounter (Signed)
  Follow up Call-  Call back number 08/11/2016  Post procedure Call Back phone  # [phone]  Permission to leave phone message Yes  Some recent data might be hidden     Patient questions:  Do you have a fever, pain , or abdominal swelling? No. Pain Score  0 *  Have you tolerated food without any problems? Yes.    Have you been able to return to your normal activities? Yes.    Do you have any questions about your discharge instructions: Diet   No. Medications  No. Follow up visit  No.  Do you have questions or concerns about your Care? No.  Actions: * If pain score is 4 or above: No action needed, pain <4.

## 2016-09-08 ENCOUNTER — Encounter: Payer: Self-pay | Admitting: Internal Medicine

## 2016-09-22 ENCOUNTER — Other Ambulatory Visit: Payer: Self-pay

## 2016-09-22 ENCOUNTER — Other Ambulatory Visit: Payer: Self-pay | Admitting: *Deleted

## 2016-09-22 DIAGNOSIS — F411 Generalized anxiety disorder: Secondary | ICD-10-CM

## 2016-09-22 MED ORDER — ALPRAZOLAM 1 MG PO TABS: 1.0000 mg | ORAL_TABLET | Freq: Two times a day (BID) | ORAL | 1 refills | Status: DC | PRN

## 2016-09-22 MED ORDER — METOPROLOL TARTRATE 100 MG PO TABS: 200.0000 mg | ORAL_TABLET | Freq: Every day | ORAL | 3 refills | Status: DC

## 2016-09-22 NOTE — Telephone Encounter (Signed)
Patient Called and requested Rx to be faxed to Pharmacy. Patient up to date on appointment.

## 2016-10-28 ENCOUNTER — Encounter: Payer: Self-pay | Admitting: Internal Medicine

## 2016-11-21 ENCOUNTER — Ambulatory Visit (INDEPENDENT_AMBULATORY_CARE_PROVIDER_SITE_OTHER): Payer: Managed Care, Other (non HMO) | Admitting: Internal Medicine

## 2016-11-21 ENCOUNTER — Encounter: Payer: Self-pay | Admitting: Internal Medicine

## 2016-11-21 VITALS — BP 118/78 | HR 56 | Temp 98.0°F | Ht 59.0 in | Wt 139.4 lb

## 2016-11-21 DIAGNOSIS — M5412 Radiculopathy, cervical region: Secondary | ICD-10-CM | POA: Diagnosis not present

## 2016-11-21 DIAGNOSIS — Z136 Encounter for screening for cardiovascular disorders: Secondary | ICD-10-CM

## 2016-11-21 DIAGNOSIS — Z79899 Other long term (current) drug therapy: Secondary | ICD-10-CM

## 2016-11-21 DIAGNOSIS — G5601 Carpal tunnel syndrome, right upper limb: Secondary | ICD-10-CM | POA: Insufficient documentation

## 2016-11-21 DIAGNOSIS — I10 Essential (primary) hypertension: Secondary | ICD-10-CM

## 2016-11-21 DIAGNOSIS — F411 Generalized anxiety disorder: Secondary | ICD-10-CM

## 2016-11-21 DIAGNOSIS — G4700 Insomnia, unspecified: Secondary | ICD-10-CM | POA: Diagnosis not present

## 2016-11-21 LAB — CBC WITH DIFFERENTIAL/PLATELET
BASOS ABS: 0 {cells}/uL (ref 0–200)
BASOS PCT: 0 %
EOS ABS: 53 {cells}/uL (ref 15–500)
Eosinophils Relative: 1 %
HEMATOCRIT: 40.4 % (ref 35.0–45.0)
Hemoglobin: 13.5 g/dL (ref 11.7–15.5)
Lymphocytes Relative: 30 %
Lymphs Abs: 1590 cells/uL (ref 850–3900)
MCH: 32.1 pg (ref 27.0–33.0)
MCHC: 33.4 g/dL (ref 32.0–36.0)
MCV: 96 fL (ref 80.0–100.0)
MONO ABS: 424 {cells}/uL (ref 200–950)
MPV: 8.9 fL (ref 7.5–12.5)
Monocytes Relative: 8 %
Neutro Abs: 3233 cells/uL (ref 1500–7800)
Neutrophils Relative %: 61 %
Platelets: 269 10*3/uL (ref 140–400)
RBC: 4.21 MIL/uL (ref 3.80–5.10)
RDW: 13 % (ref 11.0–15.0)
WBC: 5.3 10*3/uL (ref 3.8–10.8)

## 2016-11-21 MED ORDER — GABAPENTIN 300 MG PO CAPS: 300.0000 mg | ORAL_CAPSULE | Freq: Two times a day (BID) | ORAL | 6 refills | Status: DC

## 2016-11-21 MED ORDER — METOPROLOL TARTRATE 100 MG PO TABS: 200.0000 mg | ORAL_TABLET | Freq: Every day | ORAL | 6 refills | Status: DC

## 2016-11-21 MED ORDER — ZOLPIDEM TARTRATE 10 MG PO TABS: 10.0000 mg | ORAL_TABLET | Freq: Every evening | ORAL | 3 refills | Status: DC | PRN

## 2016-11-21 MED ORDER — ALPRAZOLAM 1 MG PO TABS: 1.0000 mg | ORAL_TABLET | Freq: Two times a day (BID) | ORAL | 1 refills | Status: DC | PRN

## 2016-11-21 MED ORDER — BUPROPION HCL ER (XL) 150 MG PO TB24: ORAL_TABLET | ORAL | 6 refills | Status: DC

## 2016-11-21 MED ORDER — METHOCARBAMOL 500 MG PO TABS: 500.0000 mg | ORAL_TABLET | Freq: Two times a day (BID) | ORAL | 6 refills | Status: DC

## 2016-11-21 NOTE — Patient Instructions (Addendum)
Continue current medications as ordered  Will call with lab results  Follow up in 6 mos for CPE/ECG 

## 2016-11-21 NOTE — Progress Notes (Signed)
Patient ID: Victoria Floyd, female   DOB: 1966/01/01, 51 y.o.   MRN: [id number]    Location:  [facility] Place of Service: OFFICE  Chief Complaint  Patient presents with  . Medical Management of Chronic Issues    Routine Visit  . KPN    patient states she will schedule her own PAP with a OBGYN, provided handout for patient to schedule mammogram    HPI:  51 yo female seen today for f/u  FHx colon cancer -  constipation and flatulence stable since she had a colonoscopy  earlier this yr. No bloody stools. Father had hx colon CA (dx at age 78). Colonoscopy neg for polyps but she had diverticulosis. She will have repeat in 5 yrs  GAD/insomnia - stable on alprazolam and zolpidem. In the past, she had increased stressors since dose of alprazolam reduced in July 2017 to 1 tab daily. She had frequent panic attacks with associated forgetfulness, difficulty concentrating, shaking tremors, SOB, chest tightness. She had taken xanax > 15 yrs at BID dose. She has never had counseling. Weight down 2 lbs with BMI 28.16  Postmenopausal symptoms - occasional mood swings. FDLMP >1 yr ago.  Improved with wellbutrin. No hotflashes. She plans to schedule GYN appt on her own  Anxiety/insomnia - she takes alprazolam for anxiety and zolpidem for insomnia  HTN - BP stable on metoprolol  Hx cervical radiculopathy - she is s/p anterior cervical decompression/discectomy fusion at C5-C7 in Feb 2016 by Dr Lynann Bologna. She still has numbness/tingling right middle finger intermittently. Pain controlled with robaxin and gabapentin  Seasonal allergy - stable on flonase  Hx glaucoma and cats - has not seen eye specialist in > 1 yr  Job requires repetitive motions. She is a Furniture conservator/restorer  Past Medical History:  Diagnosis Date  . Allergy   . Anxiety   . Chronic constipation   . Hypertension    hx of  . Insomnia     Past Surgical History:  Procedure Laterality Date  . ANTERIOR CERVICAL DECOMP/DISCECTOMY FUSION N/A  07/12/2014   Procedure: ANTERIOR CERVICAL DECOMPRESSION/DISCECTOMY FUSION 2 LEVELS;  Surgeon: Sinclair Ship, MD;  Location: [facility];  Service: Orthopedics;  Laterality: N/A;  Anterior cervical decompression fusion, cervical 5-6, cervical 6-7 with instrumentation, allograft  . BUNIONECTOMY Bilateral   . CERVICAL DISC SURGERY  07/12/2014   C5 C6 C7    DR [name]  . TUBAL LIGATION    . [place] EXTRACTION      Patient Care Team: Gildardo Cranker, DO as PCP - General (Internal Medicine) Pyrtle, Lajuan Lines, MD as Consulting Physician (Gastroenterology)  Social History   Social History  . Marital status: Divorced    Spouse name: N/A  . Number of children: N/A  . Years of education: N/A   Occupational History  . Not on file.   Social History Main Topics  . Smoking status: Former Smoker    Years: 15.00  . Smokeless tobacco: Never Used  . Alcohol use Yes     Comment: occasional  . Drug use: No  . Sexual activity: Not on file   Other Topics Concern  . Not on file   Social History Narrative   DIET: I eat healthy       DO YOU DRINK/EAT THINGS WITH CAFFEINE: Yes      MARITAL STATUS: Divorced      WHAT YEAR WERE YOU MARRIED: 1992      DO YOU LIVE IN A HOUSE, APARTMENT, ASSISTED LIVING, CONDO  TRAILER ETC.:  House      IS IT ONE OR MORE STORIES: Yes      HOW MANY PERSONS LIVE IN YOUR HOME: 1      DO YOU HAVE PETS IN YOUR HOME: 2 cats      CURRENT OR PAST PROFESSION: machinist/ EMT      DO YOU EXERCISE: yes      WHAT TYPE AND HOW OFTEN: occasionally        reports that she has quit smoking. She quit after 15.00 years of use. She has never used smokeless tobacco. She reports that she drinks alcohol. She reports that she does not use drugs.  Family History  Problem Relation Age of Onset  . Colon polyps Mother   . Colon cancer Father 71       passed away age 64   . Esophageal cancer Neg Hx   . Rectal cancer Neg Hx   . Stomach cancer Neg Hx    Family Status    Relation Status  . Mother Alive  . Father Deceased  . Son Alive  . Daughter Alive  . Neg Hx (Not Specified)     Allergies  Allergen Reactions  . Codeine Itching    Medications: Patient's Medications  New Prescriptions   No medications on file  Previous Medications   ALPRAZOLAM (XANAX) 1 MG TABLET    Take 1 tablet (1 mg total) by mouth 2 (two) times daily as needed for anxiety.   BUPROPION (WELLBUTRIN XL) 150 MG 24 HR TABLET    Take 1 tablet (150 mg total) by mouth 2 (two) times daily.   FLUTICASONE (FLONASE) 50 MCG/ACT NASAL SPRAY    Place 2 sprays into both nostrils daily as needed for allergies or rhinitis.   GABAPENTIN (NEURONTIN) 300 MG CAPSULE    Take 1 capsule (300 mg total) by mouth 2 (two) times daily.   METHOCARBAMOL (ROBAXIN) 500 MG TABLET    Take 1 tablet (500 mg total) by mouth 2 (two) times daily.   METOPROLOL (LOPRESSOR) 100 MG TABLET    Take 2 tablets (200 mg total) by mouth daily.   MULTIPLE VITAMINS-MINERALS (MULTIVITAMIN WITH MINERALS) TABLET    Take 1 tablet by mouth daily.   ZOLPIDEM (AMBIEN) 10 MG TABLET    Take 1 tablet (10 mg total) by mouth at bedtime as needed for sleep.  Modified Medications   No medications on file  Discontinued Medications   BISACODYL (DULCOLAX) 5 MG EC TABLET    Take 5 mg by mouth once. X 4 for 2 day colon prep for colon 3-19   POLYETHYLENE GLYCOL POWDER (MIRALAX) POWDER    Take 1 Container by mouth once. 7 caps in 32 oz gatorade 2 day prep for colon 3-19    Review of Systems  Vitals:   11/21/16 1420  BP: 118/78  Pulse: (!) 56  Temp: 98 F (36.7 C)  TempSrc: Oral  SpO2: 98%  Weight: 139 lb 6.4 oz (63.2 kg)  Height: _0  (1.499 m)   Body mass index is 28.16 kg/m.  Physical Exam  Constitutional: She is oriented to person, place, and time. She appears well-developed and well-nourished.  HENT:  Mouth/Throat: Oropharynx is clear and moist. No oropharyngeal exudate.  Eyes: Pupils are equal, round, and reactive to light.  No scleral icterus.  Neck: Neck supple. Carotid bruit is not present. No tracheal deviation present.  Cardiovascular: Normal rate, regular rhythm, normal heart sounds and intact distal pulses.  Exam reveals  no gallop and no friction rub.   No murmur heard. No LE edema b/l. no calf TTP.   Pulmonary/Chest: Effort normal and breath sounds normal. No stridor. No respiratory distress. She has no wheezes. She has no rales.  Abdominal: Soft. Bowel sounds are normal. She exhibits distension. She exhibits no mass. There is no hepatomegaly. There is no tenderness. There is no rebound and no guarding.  Musculoskeletal: She exhibits edema (small joints of fingers) and tenderness.  Right (+) Tinel's sign; neg on left  Lymphadenopathy:    She has no cervical adenopathy.  Neurological: She is alert and oriented to person, place, and time.  Skin: Skin is warm and dry. No rash noted.  Psychiatric: She has a normal mood and affect. Her speech is normal and behavior is normal. Judgment and thought content normal. Her mood appears not anxious.     Labs reviewed: No visits with results within 3 Month(s) from this visit.  Latest known visit with results is:  Hospital Outpatient Visit on 07/10/2014  Component Date Value Ref Range Status  . Preg, Serum 07/10/2014 NEGATIVE  NEGATIVE Final   Comment:        THE SENSITIVITY OF THIS METHODOLOGY IS >10 mIU/mL.   . MRSA, PCR 07/10/2014 NEGATIVE  NEGATIVE Final  . Staphylococcus aureus 07/10/2014 NEGATIVE  NEGATIVE Final   Comment:        The Xpert SA Assay (FDA approved for NASAL specimens in patients over 26 years of age), is one component of a comprehensive surveillance program.  Test performance has been validated by [facility] for patients greater than or equal to 55 year old. It is not intended to diagnose infection nor to guide or monitor treatment.   Marland Kitchen aPTT 07/10/2014 34  24 - 37 seconds Final  . WBC 07/10/2014 4.4  4.0 - 10.5 K/uL Final  . RBC  07/10/2014 4.10  3.87 - 5.11 MIL/uL Final  . Hemoglobin 07/10/2014 11.6* 12.0 - 15.0 g/dL Final  . HCT 07/10/2014 35.6* 36.0 - 46.0 % Final  . MCV 07/10/2014 86.8  78.0 - 100.0 fL Final  . MCH 07/10/2014 28.3  26.0 - 34.0 pg Final  . MCHC 07/10/2014 32.6  30.0 - 36.0 g/dL Final  . RDW 07/10/2014 17.7* 11.5 - 15.5 % Final  . Platelets 07/10/2014 297  150 - 400 K/uL Final  . Neutrophils Relative % 07/10/2014 61  43 - 77 % Final  . Neutro Abs 07/10/2014 2.8  1.7 - 7.7 K/uL Final  . Lymphocytes Relative 07/10/2014 30  12 - 46 % Final  . Lymphs Abs 07/10/2014 1.3  0.7 - 4.0 K/uL Final  . Monocytes Relative 07/10/2014 7  3 - 12 % Final  . Monocytes Absolute 07/10/2014 0.3  0.1 - 1.0 K/uL Final  . Eosinophils Relative 07/10/2014 1  0 - 5 % Final  . Eosinophils Absolute 07/10/2014 0.0  0.0 - 0.7 K/uL Final  . Basophils Relative 07/10/2014 1  0 - 1 % Final  . Basophils Absolute 07/10/2014 0.0  0.0 - 0.1 K/uL Final  . Sodium 07/10/2014 137  135 - 145 mmol/L Final  . Potassium 07/10/2014 3.6  3.5 - 5.1 mmol/L Final  . Chloride 07/10/2014 105  96 - 112 mmol/L Final  . CO2 07/10/2014 22  19 - 32 mmol/L Final  . Glucose, Bld 07/10/2014 102* 70 - 99 mg/dL Final  . BUN 07/10/2014 7  6 - 23 mg/dL Final  . Creatinine, Ser 07/10/2014 0.96  0.50 -  1.10 mg/dL Final  . Calcium 07/10/2014 9.6  8.4 - 10.5 mg/dL Final  . Total Protein 07/10/2014 7.7  6.0 - 8.3 g/dL Final  . Albumin 07/10/2014 4.5  3.5 - 5.2 g/dL Final  . AST 07/10/2014 20  0 - 37 U/L Final  . ALT 07/10/2014 14  0 - 35 U/L Final  . Alkaline Phosphatase 07/10/2014 58  39 - 117 U/L Final  . Total Bilirubin 07/10/2014 0.4  0.3 - 1.2 mg/dL Final  . GFR calc non Af Amer 07/10/2014 69* >90 mL/min Final  . GFR calc Af Amer 07/10/2014 80* >90 mL/min Final   Comment: (NOTE) The eGFR has been calculated using the CKD EPI equation. This calculation has not been validated in all clinical situations. eGFR's persistently <90 mL/min signify possible  Chronic Kidney Disease.   . Anion gap 07/10/2014 10  5 - 15 Final  . Prothrombin Time 07/10/2014 13.7  11.6 - 15.2 seconds Final  . INR 07/10/2014 1.04  0.00 - 1.49 Final  . Color, Urine 07/10/2014 YELLOW  YELLOW Final  . APPearance 07/10/2014 CLEAR  CLEAR Final  . Specific Gravity, Urine 07/10/2014 1.028  1.005 - 1.030 Final  . pH 07/10/2014 5.0  5.0 - 8.0 Final  . Glucose, UA 07/10/2014 NEGATIVE  NEGATIVE mg/dL Final  . Hgb urine dipstick 07/10/2014 NEGATIVE  NEGATIVE Final  . Bilirubin Urine 07/10/2014 SMALL* NEGATIVE Final  . Ketones, ur 07/10/2014 NEGATIVE  NEGATIVE mg/dL Final  . Protein, ur 07/10/2014 NEGATIVE  NEGATIVE mg/dL Final  . Urobilinogen, UA 07/10/2014 0.2  0.0 - 1.0 mg/dL Final  . Nitrite 07/10/2014 NEGATIVE  NEGATIVE Final  . Leukocytes, UA 07/10/2014 NEGATIVE  NEGATIVE Final   MICROSCOPIC NOT DONE ON URINES WITH NEGATIVE PROTEIN, BLOOD, LEUKOCYTES, NITRITE, OR GLUCOSE <1000 mg/dL.    No results found.   Assessment/Plan   ICD-10-CM   1. GAD (generalized anxiety disorder) F41.1 buPROPion (WELLBUTRIN XL) 150 MG 24 hr tablet    TSH    ALPRAZolam (XANAX) 1 MG tablet    DISCONTINUED: ALPRAZolam (XANAX) 1 MG tablet  2. Cervical radiculopathy M54.12 gabapentin (NEURONTIN) 300 MG capsule    methocarbamol (ROBAXIN) 500 MG tablet  3. Right carpal tunnel syndrome G56.01 gabapentin (NEURONTIN) 300 MG capsule    methocarbamol (ROBAXIN) 500 MG tablet  4. Insomnia, unspecified type G47.00 TSH    zolpidem (AMBIEN) 10 MG tablet    DISCONTINUED: zolpidem (AMBIEN) 10 MG tablet  5. Essential hypertension I10 metoprolol tartrate (LOPRESSOR) 100 MG tablet    TSH  6. High risk medication use Z79.899 CMP with eGFR    CBC with Differential/Platelets  7. Screening for cardiovascular condition Z13.6 Lipid Panel   Continue current medications as ordered  Will call with lab results  Follow up in 6 mos for CPE/ECG.     Della S. Perlie Gold  [facility] [address] [place], [place] [address] [phone] Cell (Monday-Friday 8 AM - 5 PM) [phone] After 5 PM and follow prompts

## 2016-11-22 LAB — COMPLETE METABOLIC PANEL WITH GFR
ALT: 9 U/L (ref 6–29)
AST: 17 U/L (ref 10–35)
Albumin: 4.4 g/dL (ref 3.6–5.1)
Alkaline Phosphatase: 73 U/L (ref 33–130)
BILIRUBIN TOTAL: 0.4 mg/dL (ref 0.2–1.2)
BUN: 13 mg/dL (ref 7–25)
CHLORIDE: 107 mmol/L (ref 98–110)
CO2: 22 mmol/L (ref 20–31)
Calcium: 9.5 mg/dL (ref 8.6–10.4)
Creat: 1.07 mg/dL — ABNORMAL HIGH (ref 0.50–1.05)
GFR, EST AFRICAN AMERICAN: 69 mL/min (ref >=60)
GFR, Est Non African American: 60 mL/min (ref >=60)
GLUCOSE: 80 mg/dL (ref 65–99)
Potassium: 4.3 mmol/L (ref 3.5–5.3)
SODIUM: 141 mmol/L (ref 135–146)
TOTAL PROTEIN: 7 g/dL (ref 6.1–8.1)

## 2016-11-22 LAB — LIPID PANEL
Cholesterol: 218 mg/dL — ABNORMAL HIGH (ref <200)
HDL: 67 mg/dL (ref >50)
LDL CALC: 132 mg/dL — AB (ref <100)
Total CHOL/HDL Ratio: 3.3 Ratio (ref <5.0)
Triglycerides: 96 mg/dL (ref <150)
VLDL: 19 mg/dL (ref <30)

## 2016-11-22 LAB — TSH: TSH: 1.21 mIU/L

## 2016-12-08 ENCOUNTER — Telehealth: Payer: Self-pay

## 2016-12-08 NOTE — Telephone Encounter (Signed)
Patient called to see if there was something she could do or take to help ease the pain from a heel spur. She stated that it was discussed at office visit and she stated that she thought it was under control with new shoes but the pain has returned.   Please advise with any recommendation

## 2016-12-08 NOTE — Telephone Encounter (Signed)
She can f/u with Ortho or podiatry for additional recommendations

## 2016-12-08 NOTE — Telephone Encounter (Signed)
Pt verbalized understanding. She stated that she would schedule an appointment.

## 2016-12-12 ENCOUNTER — Encounter: Payer: Managed Care, Other (non HMO) | Admitting: Internal Medicine

## 2016-12-30 ENCOUNTER — Encounter: Payer: Managed Care, Other (non HMO) | Admitting: Internal Medicine

## 2017-02-04 ENCOUNTER — Other Ambulatory Visit: Payer: Self-pay | Admitting: *Deleted

## 2017-02-04 DIAGNOSIS — F411 Generalized anxiety disorder: Secondary | ICD-10-CM

## 2017-02-04 MED ORDER — ALPRAZOLAM 1 MG PO TABS: 1.0000 mg | ORAL_TABLET | Freq: Two times a day (BID) | ORAL | 1 refills | Status: DC | PRN

## 2017-02-04 NOTE — Telephone Encounter (Signed)
Patient requested. Printed and faxed.

## 2017-02-27 ENCOUNTER — Encounter: Payer: Managed Care, Other (non HMO) | Admitting: Internal Medicine

## 2017-03-24 ENCOUNTER — Other Ambulatory Visit: Payer: Self-pay | Admitting: *Deleted

## 2017-03-24 DIAGNOSIS — M5412 Radiculopathy, cervical region: Secondary | ICD-10-CM

## 2017-03-24 DIAGNOSIS — G5601 Carpal tunnel syndrome, right upper limb: Secondary | ICD-10-CM

## 2017-03-24 MED ORDER — METHOCARBAMOL 500 MG PO TABS: 500.0000 mg | ORAL_TABLET | Freq: Two times a day (BID) | ORAL | 3 refills | Status: DC

## 2017-03-24 NOTE — Telephone Encounter (Signed)
[facility] 

## 2017-03-27 ENCOUNTER — Encounter: Payer: Managed Care, Other (non HMO) | Admitting: Internal Medicine

## 2017-03-30 ENCOUNTER — Other Ambulatory Visit: Payer: Self-pay | Admitting: Internal Medicine

## 2017-03-30 DIAGNOSIS — F411 Generalized anxiety disorder: Secondary | ICD-10-CM

## 2017-04-06 ENCOUNTER — Other Ambulatory Visit: Payer: Self-pay | Admitting: *Deleted

## 2017-04-06 DIAGNOSIS — G4700 Insomnia, unspecified: Secondary | ICD-10-CM

## 2017-04-06 MED ORDER — ZOLPIDEM TARTRATE 10 MG PO TABS: 10.0000 mg | ORAL_TABLET | Freq: Every evening | ORAL | 3 refills | Status: DC | PRN

## 2017-04-06 NOTE — Telephone Encounter (Signed)
Patient requested. Faxed to pharmacy.  

## 2017-04-07 ENCOUNTER — Ambulatory Visit (INDEPENDENT_AMBULATORY_CARE_PROVIDER_SITE_OTHER): Payer: Managed Care, Other (non HMO) | Admitting: Internal Medicine

## 2017-04-07 ENCOUNTER — Encounter: Payer: Self-pay | Admitting: Internal Medicine

## 2017-04-07 VITALS — BP 120/64 | HR 60 | Temp 97.9°F | Resp 18 | Ht 59.0 in | Wt 136.4 lb

## 2017-04-07 DIAGNOSIS — Z79899 Other long term (current) drug therapy: Secondary | ICD-10-CM

## 2017-04-07 DIAGNOSIS — Z0000 Encounter for general adult medical examination without abnormal findings: Secondary | ICD-10-CM

## 2017-04-07 DIAGNOSIS — M5412 Radiculopathy, cervical region: Secondary | ICD-10-CM

## 2017-04-07 DIAGNOSIS — G4700 Insomnia, unspecified: Secondary | ICD-10-CM | POA: Diagnosis not present

## 2017-04-07 DIAGNOSIS — F411 Generalized anxiety disorder: Secondary | ICD-10-CM | POA: Diagnosis not present

## 2017-04-07 DIAGNOSIS — Z1231 Encounter for screening mammogram for malignant neoplasm of breast: Secondary | ICD-10-CM | POA: Diagnosis not present

## 2017-04-07 DIAGNOSIS — I10 Essential (primary) hypertension: Secondary | ICD-10-CM

## 2017-04-07 DIAGNOSIS — Z1239 Encounter for other screening for malignant neoplasm of breast: Secondary | ICD-10-CM

## 2017-04-07 NOTE — Patient Instructions (Addendum)
Continue current medications as ordered  PLEASE SCHEDULE APPT WITH GYN FOR ROUTINE ANNUAL EXAM  Will call with mammogram appt  Follow up in 6 mos for HTN, seasonal allergy, cervical radiculopathy

## 2017-04-07 NOTE — Progress Notes (Signed)
Patient ID: Victoria Floyd, female   DOB: [birth year], 51 y.o.   MRN: [id number]   Location:  [facility]  Place of Service:  OFFICE  Provider: Elmon Kirschner, DO  Patient Care Team: Kirt Boys, DO as PCP - General (Internal Medicine) Pyrtle, [name], MD as Consulting Physician (Gastroenterology)  Extended Emergency Contact Information Primary Emergency Contact: Worrell,[name] Address: [address]          [facility] [place], [place] [address] [place] Home Phone: [phone] Relation: Mother  Code Status: FULL CODE Goals of Care: Advanced Directive information Advanced Directives 11/21/2016  Does Patient Have a Medical Advance Directive? No  Would patient like information on creating a medical advance directive? No - Patient declined     Chief Complaint  Patient presents with  . Medical Management of Chronic Issues    No conerns at this time.   . Medication Refill    Zolpidem( pending order)  . Immunizations    Declined the flu vaccine today  . Health Maintenence    Patient interested in a mammogram    HPI: Patient is a 51 y.o. female seen in today for an annual wellness exam.  She reports feeling well overall. Right heel pain resolved. She has issues with left 2nd toe as it is deformed and flexed over 1st toe.  FHx colon cancer -  constipation and flatulence stable since she had a colonoscopy  earlier this yr. No bloody stools. Father had hx colon CA (dx at age 105). Colonoscopy neg for polyps but she had diverticulosis. She will have repeat in 5 yrs  GAD/insomnia - mood stable on wellbutrin, alprazolam and zolpidem. In the past, she had increased stressors since dose of alprazolam reduced in July 2017 to 1 tab daily. She had frequent panic attacks with associated forgetfulness, difficulty concentrating, shaking tremors, SOB, chest tightness. She had taken xanax > 15 yrs at BID dose. She has never had counseling. Weight down 3 lbs with BMI 27.53  Postmenopausal symptoms -  occasional mood swings. FDLMP >1 yr ago.  Improved with wellbutrin. No hotflashes. She has not made appt with GYN yet  HTN - BP controlled on metoprolol  Hx cervical radiculopathy - stable. she is s/p anterior cervical decompression/discectomy fusion at C5-C7 in Feb 2016 by Dr Yevette Edwards. She still has numbness/tingling right middle finger intermittently. Pain controlled with robaxin and gabapentin  Seasonal allergy - stable on flonase  Hx glaucoma and cats - followed by eye specialist. 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 BMI 27.53  Depression screen [facility] 2/9 04/07/2017 04/04/2016  Decreased Interest 0 0  Down, Depressed, Hopeless 0 0  PHQ - 2 Score 0 0    Fall Risk  11/21/2016 07/04/2016 04/04/2016  Falls in the past year? No No No   No flowsheet data found.   Health Maintenance  Topic Date Due  . MAMMOGRAM  10/09/2015  . INFLUENZA VACCINE  08/31/2017 (Originally 12/24/2016)  . PAP SMEAR  04/07/2018 (Originally 10/09/1986)  . HIV Screening  04/07/2018 (Originally 10/08/1980)  . COLONOSCOPY  08/12/2021  . TETANUS/TDAP  02/23/2025     Past Medical History:  Diagnosis Date  . Allergy   . Anxiety   . Chronic constipation   . Hypertension    hx of  . Insomnia     Past Surgical History:  Procedure Laterality Date  . BUNIONECTOMY Bilateral   . CERVICAL DISC  SURGERY  07/12/2014   C5 C6 C7    DR [name]  . TUBAL LIGATION    . WISDOM TOOTH EXTRACTION      Family History  Problem Relation Age of Onset  . Colon polyps Mother   . Colon cancer Father [id number]       passed away age 51   . Esophageal cancer Neg Hx   . Rectal cancer Neg Hx   . Stomach cancer Neg Hx    Family Status  Relation Name Status  . Mother  Alive  . Father  Deceased  . Son  Alive  . Daughter  Alive  . Neg Hx  (Not Specified)     Social History   Socioeconomic History  . Marital status: Divorced    Spouse name: Not on file    . Number of children: Not on file  . Years of education: Not on file  . Highest education level: Not on file  Social Needs  . Financial resource strain: Not on file  . Food insecurity - worry: Not on file  . Food insecurity - inability: Not on file  . Transportation needs - medical: Not on file  . Transportation needs - non-medical: Not on file  Occupational History  . Not on file  Tobacco Use  . Smoking status: Former Smoker    Years: 15.00  . Smokeless tobacco: Never Used  Substance and Sexual Activity  . Alcohol use: Yes    Comment: occasional  . Drug use: No  . Sexual activity: Not on file  Other Topics Concern  . Not on file  Social History Narrative   DIET: I eat healthy       DO YOU DRINK/EAT THINGS WITH CAFFEINE: Yes      MARITAL STATUS: Divorced      WHAT YEAR WERE YOU MARRIED: 1992      DO YOU LIVE IN A HOUSE, APARTMENT, ASSISTED LIVING, CONDO TRAILER ETC.:  House      IS IT ONE OR MORE STORIES: Yes      HOW MANY PERSONS LIVE IN YOUR HOME: 1      DO YOU HAVE PETS IN YOUR HOME: 2 cats      CURRENT OR PAST PROFESSION: machinist/ EMT      DO YOU EXERCISE: yes      WHAT TYPE AND HOW OFTEN: occasionally    Allergies  Allergen Reactions  . Codeine Itching    Allergies as of 04/07/2017      Reactions   Codeine Itching      Medication List        Accurate as of 04/07/17 10:06 AM. Always use your most recent med list.          ALPRAZolam 1 MG tablet Commonly known as:  XANAX TAKE 1 TABLET BY MOUTH TWICE DAILY AS NEEDED FOR ANXIETY   buPROPion 150 MG 24 hr tablet Commonly known as:  WELLBUTRIN XL Take 2 tabs po in AM and 1 tab in PM   fluticasone 50 MCG/ACT nasal spray Commonly known as:  FLONASE Place 2 sprays into both nostrils daily as needed for allergies or rhinitis.   gabapentin 300 MG capsule Commonly known as:  NEURONTIN Take 1 capsule (300 mg total) by mouth 2 (two) times daily.   methocarbamol 500 MG tablet Commonly known  as:  ROBAXIN Take 1 tablet (500 mg total) by mouth 2 (two) times daily.   metoprolol tartrate 100 MG tablet Commonly known as:  LOPRESSOR  Take 2 tablets (200 mg total) by mouth daily.   multivitamin with minerals tablet Take 1 tablet by mouth daily.   zolpidem 10 MG tablet Commonly known as:  AMBIEN Take 1 tablet (10 mg total) at bedtime as needed by mouth for sleep.        Review of Systems:  Review of Systems  Musculoskeletal: Positive for arthralgias and neck pain.  Neurological: Positive for numbness.  Psychiatric/Behavioral: Positive for sleep disturbance. The patient is nervous/anxious.   All other systems reviewed and are negative.   Physical Exam: Vitals:   04/07/17 0952  BP: 120/64  Pulse: 60  Resp: 18  Temp: 97.9 F (36.6 C)  TempSrc: Oral  SpO2: 97%  Weight: 136 lb 6.4 oz (61.9 kg)  Height: 4\' 11"  (1.499 m)   Body mass index is 27.55 kg/m. Physical Exam  Constitutional: She is oriented to person, place, and time. She appears well-developed and well-nourished. No distress.  HENT:  Head: Normocephalic and atraumatic.  Right Ear: External ear normal.  Left Ear: External ear normal.  Mouth/Throat: Oropharynx is clear and moist. No oropharyngeal exudate.  MMM; no oral thrush  Eyes: EOM are normal. Pupils are equal, round, and reactive to light. No scleral icterus.  Neck: Normal range of motion. Neck supple. Carotid bruit is not present. No tracheal deviation present. No thyromegaly present.  Cardiovascular: Normal rate, regular rhythm and intact distal pulses. Exam reveals no gallop and no friction rub.  No murmur heard. No LE edema b/l. No calf TTP  Pulmonary/Chest: Effort normal and breath sounds normal. No respiratory distress. She has no wheezes. She has no rales. She exhibits no tenderness. Right breast exhibits no inverted nipple, no mass, no nipple discharge, no skin change and no tenderness. Left breast exhibits no inverted nipple, no mass, no  nipple discharge, no skin change and no tenderness. Breasts are symmetrical.  No rhonchi  Abdominal: Soft. Bowel sounds are normal. She exhibits no distension and no mass. There is no hepatosplenomegaly or hepatomegaly. There is no tenderness. There is no rebound and no guarding. No hernia.  Musculoskeletal: She exhibits no deformity.  Lymphadenopathy:    She has no cervical adenopathy.  Neurological: She is alert and oriented to person, place, and time. She has normal reflexes.  Skin: Skin is warm and dry. No rash noted.  Psychiatric: She has a normal mood and affect. Her behavior is normal. Judgment and thought content normal.  Vitals reviewed.   Labs reviewed:  Basic Metabolic Panel: Recent Labs    11/21/16 1510  NA 141  K 4.3  CL 107  CO2 22  GLUCOSE 80  BUN 13  CREATININE 1.07*  CALCIUM 9.5  TSH 1.21   Liver Function Tests: Recent Labs    11/21/16 1510  AST 17  ALT 9  ALKPHOS 73  BILITOT 0.4  PROT 7.0  ALBUMIN 4.4   No results for input(s): LIPASE, AMYLASE in the last 8760 hours. No results for input(s): AMMONIA in the last 8760 hours. CBC: Recent Labs    11/21/16 1510  WBC 5.3  NEUTROABS 3,233  HGB 13.5  HCT 40.4  MCV 96.0  PLT 269   Lipid Panel: Recent Labs    11/21/16 1510  CHOL 218*  HDL 67  LDLCALC 132*  TRIG 96  CHOLHDL 3.3   No results found for: HGBA1C  Procedures: No results found. ECG OBTAINED AND REVIEWED BY MYSELF: NSR @ 60 bpm, LAD, LAE, poor R wave progression. No acute ischemic  changes. No change since 06/2014  Assessment/Plan   ICD-10-CM   1. Well adult exam Z00.00   2. Insomnia, unspecified type G47.00   3. Essential hypertension I10 EKG 12-Lead  4. High risk medication use Z79.899 EKG 12-Lead  5. GAD (generalized anxiety disorder) F41.1   6. Cervical radiculopathy M54.12   7. Screening for breast cancer Z12.31 MM DIGITAL SCREENING BILATERAL   Continue diet and exercise program - weight is slowly improving  Continue  current medications as ordered  PLEASE SCHEDULE APPT WITH GYN FOR ROUTINE ANNUAL EXAM  Will call with mammogram appt  Follow up in 6 mos for HTN, seasonal allergy, cervical radiculopathy  Shalia S. Ancil Linsey  [facility] [address] [place], [place] [address] [phone] Cell (Monday-Friday 8 AM - 5 PM) [phone] After 5 PM and follow prompts

## 2017-04-08 ENCOUNTER — Encounter: Payer: Self-pay | Admitting: Internal Medicine

## 2017-05-01 ENCOUNTER — Other Ambulatory Visit: Payer: Self-pay | Admitting: Internal Medicine

## 2017-05-01 DIAGNOSIS — F411 Generalized anxiety disorder: Secondary | ICD-10-CM

## 2017-05-01 NOTE — Telephone Encounter (Signed)
A refill request was received from pharmacy. Rx was printed and placed in Dr. Celene Skeenarter's folder for signing after being checked on the [employer] Database. Last refill was 03/30/17.

## 2017-05-04 ENCOUNTER — Ambulatory Visit: Payer: Self-pay

## 2017-05-28 ENCOUNTER — Other Ambulatory Visit: Payer: Self-pay | Admitting: Internal Medicine

## 2017-05-28 DIAGNOSIS — F411 Generalized anxiety disorder: Secondary | ICD-10-CM

## 2017-05-29 ENCOUNTER — Other Ambulatory Visit: Payer: Self-pay | Admitting: *Deleted

## 2017-05-29 DIAGNOSIS — F411 Generalized anxiety disorder: Secondary | ICD-10-CM

## 2017-05-29 MED ORDER — ALPRAZOLAM 1 MG PO TABS: ORAL_TABLET | ORAL | 0 refills | Status: DC

## 2017-06-01 ENCOUNTER — Other Ambulatory Visit: Payer: Self-pay | Admitting: Internal Medicine

## 2017-06-01 DIAGNOSIS — F411 Generalized anxiety disorder: Secondary | ICD-10-CM

## 2017-06-02 ENCOUNTER — Other Ambulatory Visit: Payer: Self-pay

## 2017-06-02 ENCOUNTER — Ambulatory Visit
Admission: RE | Admit: 2017-06-02 | Discharge: 2017-06-02 | Disposition: A | Discharge status: Home / Self Care | Payer: 59 | Source: Ambulatory Visit | Attending: Internal Medicine | Admitting: Internal Medicine

## 2017-06-02 DIAGNOSIS — Z1239 Encounter for other screening for malignant neoplasm of breast: Secondary | ICD-10-CM

## 2017-06-02 DIAGNOSIS — F411 Generalized anxiety disorder: Secondary | ICD-10-CM

## 2017-06-02 MED ORDER — ALPRAZOLAM 1 MG PO TABS: ORAL_TABLET | ORAL | 0 refills | Status: DC

## 2017-06-02 NOTE — Telephone Encounter (Signed)
Refill request was received for alprazolam 1 mg. Rx was called in to pharmacy after verifying last fill date, provider, and quantity on [place] database.

## 2017-06-03 ENCOUNTER — Other Ambulatory Visit: Payer: Self-pay | Admitting: Internal Medicine

## 2017-06-03 DIAGNOSIS — R928 Other abnormal and inconclusive findings on diagnostic imaging of breast: Secondary | ICD-10-CM

## 2017-06-11 ENCOUNTER — Other Ambulatory Visit: Payer: 59

## 2017-06-17 ENCOUNTER — Ambulatory Visit
Admission: RE | Admit: 2017-06-17 | Discharge: 2017-06-17 | Disposition: A | Discharge status: Home / Self Care | Payer: 59 | Source: Ambulatory Visit | Attending: Internal Medicine | Admitting: Internal Medicine

## 2017-06-17 DIAGNOSIS — R928 Other abnormal and inconclusive findings on diagnostic imaging of breast: Secondary | ICD-10-CM

## 2017-06-29 NOTE — Telephone Encounter (Signed)
Appt rescheduled.Marland Kitchen.Marland Kitchen.Done

## 2017-07-01 ENCOUNTER — Other Ambulatory Visit: Payer: Self-pay | Admitting: Internal Medicine

## 2017-07-01 DIAGNOSIS — F411 Generalized anxiety disorder: Secondary | ICD-10-CM

## 2017-07-01 DIAGNOSIS — I10 Essential (primary) hypertension: Secondary | ICD-10-CM

## 2017-07-03 ENCOUNTER — Other Ambulatory Visit: Payer: Self-pay | Admitting: *Deleted

## 2017-07-03 DIAGNOSIS — F411 Generalized anxiety disorder: Secondary | ICD-10-CM

## 2017-07-03 MED ORDER — ALPRAZOLAM 1 MG PO TABS: ORAL_TABLET | ORAL | 0 refills | Status: DC

## 2017-07-03 MED ORDER — BUPROPION HCL ER (XL) 150 MG PO TB24
ORAL_TABLET | ORAL | 6 refills | Status: DC
Start: 2017-07-03 — End: 2017-09-22

## 2017-07-03 NOTE — Telephone Encounter (Signed)
Patient called and wanted to know why we decreased her Wellbutrin to twice daily instead of three times daily. Our medication list DOES NOT reflect those directions.  I called [facility] and spoke with the pharmacist and she stated that the current Rx ran out of refills so the computer system linked the old rx from a year ago that was on hold and filled it. Very confusing. I called in a new Rx and asked for that one to be taken off file.  She stated that if the patient brought in her bottle of medication they would make it right. Patient notified and agreed.   Patient also asked for a refill on her Alprazolam. Phoned in to the pharmacy.

## 2017-07-30 ENCOUNTER — Other Ambulatory Visit: Payer: Self-pay | Admitting: Internal Medicine

## 2017-07-30 DIAGNOSIS — G4700 Insomnia, unspecified: Secondary | ICD-10-CM

## 2017-07-30 DIAGNOSIS — F411 Generalized anxiety disorder: Secondary | ICD-10-CM

## 2017-07-30 MED ORDER — ZOLPIDEM TARTRATE 10 MG PO TABS: 10.0000 mg | ORAL_TABLET | Freq: Every evening | ORAL | 0 refills | Status: DC | PRN

## 2017-07-30 NOTE — Telephone Encounter (Signed)
A medication refill was received from pharmacy for alprazolam 1 mg and zolpidem 10 mg. Rx was called in to pharmacy after verifying last fill date, provider, and quantity on PMP [place] database.

## 2017-08-04 ENCOUNTER — Ambulatory Visit: Payer: 59 | Admitting: Internal Medicine

## 2017-08-14 ENCOUNTER — Ambulatory Visit: Payer: Managed Care, Other (non HMO) | Admitting: Internal Medicine

## 2017-08-24 ENCOUNTER — Other Ambulatory Visit: Payer: Self-pay | Admitting: Internal Medicine

## 2017-08-24 DIAGNOSIS — G4700 Insomnia, unspecified: Secondary | ICD-10-CM

## 2017-08-24 DIAGNOSIS — F411 Generalized anxiety disorder: Secondary | ICD-10-CM

## 2017-08-24 DIAGNOSIS — J302 Other seasonal allergic rhinitis: Secondary | ICD-10-CM

## 2017-08-27 ENCOUNTER — Other Ambulatory Visit: Payer: Self-pay | Admitting: *Deleted

## 2017-08-27 DIAGNOSIS — G4700 Insomnia, unspecified: Secondary | ICD-10-CM

## 2017-08-27 DIAGNOSIS — F411 Generalized anxiety disorder: Secondary | ICD-10-CM

## 2017-08-27 MED ORDER — ZOLPIDEM TARTRATE 10 MG PO TABS: 10.0000 mg | ORAL_TABLET | Freq: Every evening | ORAL | 0 refills | Status: DC | PRN

## 2017-08-27 MED ORDER — ALPRAZOLAM 1 MG PO TABS: ORAL_TABLET | ORAL | 0 refills | Status: DC

## 2017-08-27 NOTE — Telephone Encounter (Signed)
Patient requesting early refill on medication. [facility] is [facility] [place] [facility].   [place]CC[place] Database Verified LR: 07/30/17 Pharmacy Confirmed Pended Rx's and sent to Dr. Montez Moritaarter for Approval.

## 2017-09-16 ENCOUNTER — Ambulatory Visit: Payer: Managed Care, Other (non HMO) | Admitting: Internal Medicine

## 2017-09-22 ENCOUNTER — Encounter: Payer: Self-pay | Admitting: Internal Medicine

## 2017-09-22 ENCOUNTER — Ambulatory Visit (INDEPENDENT_AMBULATORY_CARE_PROVIDER_SITE_OTHER): Payer: Managed Care, Other (non HMO) | Admitting: Internal Medicine

## 2017-09-22 VITALS — BP 100/60 | HR 62 | Temp 97.9°F | Ht 59.0 in | Wt 146.2 lb

## 2017-09-22 DIAGNOSIS — F411 Generalized anxiety disorder: Secondary | ICD-10-CM | POA: Diagnosis not present

## 2017-09-22 DIAGNOSIS — I10 Essential (primary) hypertension: Secondary | ICD-10-CM | POA: Diagnosis not present

## 2017-09-22 DIAGNOSIS — F4321 Adjustment disorder with depressed mood: Secondary | ICD-10-CM | POA: Diagnosis not present

## 2017-09-22 DIAGNOSIS — M5412 Radiculopathy, cervical region: Secondary | ICD-10-CM | POA: Diagnosis not present

## 2017-09-22 DIAGNOSIS — Z79899 Other long term (current) drug therapy: Secondary | ICD-10-CM | POA: Diagnosis not present

## 2017-09-22 DIAGNOSIS — G4700 Insomnia, unspecified: Secondary | ICD-10-CM | POA: Diagnosis not present

## 2017-09-22 DIAGNOSIS — E782 Mixed hyperlipidemia: Secondary | ICD-10-CM

## 2017-09-22 DIAGNOSIS — G5601 Carpal tunnel syndrome, right upper limb: Secondary | ICD-10-CM | POA: Diagnosis not present

## 2017-09-22 MED ORDER — ZOLPIDEM TARTRATE 10 MG PO TABS: 10.0000 mg | ORAL_TABLET | Freq: Every evening | ORAL | 0 refills | Status: DC | PRN

## 2017-09-22 MED ORDER — BUPROPION HCL ER (XL) 150 MG PO TB24: ORAL_TABLET | ORAL | 6 refills | Status: DC

## 2017-09-22 MED ORDER — TRAZODONE HCL 50 MG PO TABS: 50.0000 mg | ORAL_TABLET | Freq: Every day | ORAL | 6 refills | Status: DC

## 2017-09-22 MED ORDER — ALPRAZOLAM 1 MG PO TABS: ORAL_TABLET | ORAL | 0 refills | Status: DC

## 2017-09-22 MED ORDER — METHOCARBAMOL 500 MG PO TABS
500.0000 mg | ORAL_TABLET | Freq: Two times a day (BID) | ORAL | 3 refills | Status: DC
Start: 2017-09-22 — End: 2018-01-01

## 2017-09-22 MED ORDER — GABAPENTIN 300 MG PO CAPS: 300.0000 mg | ORAL_CAPSULE | Freq: Two times a day (BID) | ORAL | 6 refills | Status: DC

## 2017-09-22 NOTE — Progress Notes (Signed)
Patient ID: Victoria Floyd, female   DOB: Nov 21, 1965, 52 y.o.   MRN: [id number]   Location:  [facility] OFFICE  Provider: DR Arletha Grippe  Code Status:  Goals of Care:  Advanced Directives 11/21/2016  Does Patient Have a Medical Advance Directive? No  Would patient like information on creating a medical advance directive? No - Patient declined     Chief Complaint  Patient presents with  . Medical Management of Chronic Issues    HTN, seasonal allergies, cervical radiculpathy F/U  . Medication Refill    All Med's  . Advance Care Planning    No ACP    HPI: Patient is a 52 y.o. female seen today for medical management of chronic diseases.  Her daughter committed suicide on Jul 25, 2017 (GSW to head) after aurgment with female significant other. She reports increased insomnia, racing thoughts, anxiety.   Hyperlipidemia - diet controlled. LDL 132; Tchol 218; HDL 67  FHx colon cancer -  constipation and flatulence stable since she had a colonoscopy  earlier this yr. No bloody stools. Father had hx colon CA (dx at age 67). Colonoscopy neg for polyps but she had diverticulosis. She will have repeat in 5 yrs  GAD/insomnia - mood stable on wellbutrin, alprazolam and zolpidem. In the past, she had increased stressors since dose of alprazolam reduced in July 2017 to 1 tab daily. She had frequent panic attacks with associated forgetfulness, difficulty concentrating, shaking tremors, SOB, chest tightness. She had taken xanax > 15 yrs at BID dose. She has never had counseling. Increased BMI 29.53  Postmenopausal symptoms - occasional mood swings;  Improved with wellbutrin. No hotflashes.   HTN - BP stable on metoprolol  Hx cervical radiculopathy - s/p anterior cervical decompression/discectomy fusion at C5-C7 in Feb 2016 by Dr Lynann Bologna. She still has numbness/tingling right middle finger intermittently. Pain controlled with robaxin and gabapentin  Seasonal allergy - controlled on flonase  Hx  glaucoma and cats - followed by eye specialist. She wears eyeglasses  Job requires repetitive motions. She is a Furniture conservator/restorer  Overweight - weight up. Current BMI 29.53    Past Medical History:  Diagnosis Date  . Allergy   . Anxiety   . Chronic constipation   . Hypertension    hx of  . Insomnia     Past Surgical History:  Procedure Laterality Date  . ANTERIOR CERVICAL DECOMP/DISCECTOMY FUSION N/A 07/12/2014   Procedure: ANTERIOR CERVICAL DECOMPRESSION/DISCECTOMY FUSION 2 LEVELS;  Surgeon: Sinclair Ship, MD;  Location: [place];  Service: Orthopedics;  Laterality: N/A;  Anterior cervical decompression fusion, cervical 5-6, cervical 6-7 with instrumentation, allograft  . BUNIONECTOMY Bilateral   . CERVICAL DISC SURGERY  07/12/2014   C5 C6 C7    DR [name]  . TUBAL LIGATION    . WISDOM TOOTH EXTRACTION       reports that she has quit smoking. She quit after 15.00 years of use. She has never used smokeless tobacco. She reports that she drinks alcohol. She reports that she does not use drugs. Social History   Socioeconomic History  . Marital status: Divorced    Spouse name: Not on file  . Number of children: Not on file  . Years of education: Not on file  . Highest education level: Not on file  Occupational History  . Not on file  Social Needs  . Financial resource strain: Not on file  . Food insecurity:    Worry: Not on file    Inability:  Not on file  . Transportation needs:    Medical: Not on file    Non-medical: Not on file  Tobacco Use  . Smoking status: Former Smoker    Years: 15.00  . Smokeless tobacco: Never Used  Substance and Sexual Activity  . Alcohol use: Yes    Comment: occasional  . Drug use: No  . Sexual activity: Not on file  Lifestyle  . Physical activity:    Days per week: Not on file    Minutes per session: Not on file  . Stress: Not on file  Relationships  . Social connections:    Talks on phone: Not on file    Gets together: Not on file     Attends religious service: Not on file    Active member of club or organization: Not on file    Attends meetings of clubs or organizations: Not on file    Relationship status: Not on file  . Intimate partner violence:    Fear of current or ex partner: Not on file    Emotionally abused: Not on file    Physically abused: Not on file    Forced sexual activity: Not on file  Other Topics Concern  . Not on file  Social History Narrative   DIET: I eat healthy       DO YOU DRINK/EAT THINGS WITH CAFFEINE: Yes      MARITAL STATUS: Divorced      WHAT YEAR WERE YOU MARRIED: 1992      DO YOU LIVE IN A HOUSE, APARTMENT, ASSISTED LIVING, CONDO TRAILER ETC.:  House      IS IT ONE OR MORE STORIES: Yes      HOW MANY PERSONS LIVE IN YOUR HOME: 1      DO YOU HAVE PETS IN YOUR HOME: 2 cats      CURRENT OR PAST PROFESSION: machinist/ EMT      DO YOU EXERCISE: yes      WHAT TYPE AND HOW OFTEN: occasionally    Family History  Problem Relation Age of Onset  . Colon polyps Mother   . Colon cancer Father 38       passed away age 32   . Esophageal cancer Neg Hx   . Rectal cancer Neg Hx   . Stomach cancer Neg Hx   . Breast cancer Neg Hx     Allergies  Allergen Reactions  . Codeine Itching    Outpatient Encounter Medications as of 09/22/2017  Medication Sig  . ALPRAZolam (XANAX) 1 MG tablet Take one tablet by mouth twice daily as needed for anxiety  . buPROPion (WELLBUTRIN XL) 150 MG 24 hr tablet Take 2 tabs po in AM and 1 tab in PM  . fluticasone (FLONASE) 50 MCG/ACT nasal spray PLACE 2 SPRAYS INTO BOTH NOSTRILS DAILY AS NEEDED FOR ALLERGIES OR RHINITIS  . gabapentin (NEURONTIN) 300 MG capsule Take 1 capsule (300 mg total) by mouth 2 (two) times daily.  . methocarbamol (ROBAXIN) 500 MG tablet Take 1 tablet (500 mg total) by mouth 2 (two) times daily.  . metoprolol tartrate (LOPRESSOR) 100 MG tablet TAKE 2 TABLETS BY MOUTH DAILY  . Multiple Vitamins-Minerals (MULTIVITAMIN WITH  MINERALS) tablet Take 1 tablet by mouth daily.  Marland Kitchen zolpidem (AMBIEN) 10 MG tablet Take 1 tablet (10 mg total) by mouth at bedtime as needed for sleep.   No facility-administered encounter medications on file as of 09/22/2017.     Review of Systems:  Review of Systems  Musculoskeletal: Positive for arthralgias.  Neurological: Positive for numbness.  Psychiatric/Behavioral: Positive for dysphoric mood. The patient is nervous/anxious.   All other systems reviewed and are negative.   Health Maintenance  Topic Date Due  . PAP SMEAR  04/07/2018 (Originally 10/09/1986)  . HIV Screening  04/07/2018 (Originally 10/08/1980)  . INFLUENZA VACCINE  12/24/2017  . MAMMOGRAM  06/03/2019  . COLONOSCOPY  08/12/2021  . TETANUS/TDAP  02/23/2025    Physical Exam: Vitals:   09/22/17 0919  BP: 100/60  Pulse: 62  Temp: 97.9 F (36.6 C)  TempSrc: Oral  SpO2: 98%  Weight: 146 lb 3.2 oz (66.3 kg)  Height: _0  (1.499 m)   Body mass index is 29.53 kg/m. Physical Exam  Constitutional: She is oriented to person, place, and time. She appears well-developed and well-nourished.  Tearful, looks sad in NAD  HENT:  Mouth/Throat: Oropharynx is clear and moist. No oropharyngeal exudate.  Eyes: Pupils are equal, round, and reactive to light. No scleral icterus.  Neck: Neck supple. Carotid bruit is not present. No tracheal deviation present. No thyromegaly present.  Cardiovascular: Normal rate, regular rhythm, normal heart sounds and intact distal pulses. Exam reveals no gallop and no friction rub.  No murmur heard. No LE edema b/l. no calf TTP.   Pulmonary/Chest: Effort normal and breath sounds normal. No stridor. No respiratory distress. She has no wheezes. She has no rales.  Abdominal: Soft. Bowel sounds are normal. She exhibits no distension and no mass. There is no hepatomegaly. There is no tenderness. There is no rebound and no guarding.  Musculoskeletal: She exhibits no edema.  Lymphadenopathy:     She has no cervical adenopathy.  Neurological: She is alert and oriented to person, place, and time. She has normal reflexes.  Skin: Skin is warm and dry. No rash noted.  Psychiatric: Her speech is normal and behavior is normal. Judgment and thought content normal. She exhibits a depressed mood.    Labs reviewed: Basic Metabolic Panel: Recent Labs    11/21/16 1510  NA 141  K 4.3  CL 107  CO2 22  GLUCOSE 80  BUN 13  CREATININE 1.07*  CALCIUM 9.5  TSH 1.21   Liver Function Tests: Recent Labs    11/21/16 1510  AST 17  ALT 9  ALKPHOS 73  BILITOT 0.4  PROT 7.0  ALBUMIN 4.4   No results for input(s): LIPASE, AMYLASE in the last 8760 hours. No results for input(s): AMMONIA in the last 8760 hours. CBC: Recent Labs    11/21/16 1510  WBC 5.3  NEUTROABS 3,233  HGB 13.5  HCT 40.4  MCV 96.0  PLT 269   Lipid Panel: Recent Labs    11/21/16 1510  CHOL 218*  HDL 67  LDLCALC 132*  TRIG 96  CHOLHDL 3.3   No results found for: HGBA1C  Procedures since last visit: No results found.  Assessment/Plan     ICD-10-CM   1. Grief reaction F43.21   2. GAD (generalized anxiety disorder) F41.1 buPROPion (WELLBUTRIN XL) 150 MG 24 hr tablet    ALPRAZolam (XANAX) 1 MG tablet  3. Cervical radiculopathy M54.12 gabapentin (NEURONTIN) 300 MG capsule    methocarbamol (ROBAXIN) 500 MG tablet  4. Right carpal tunnel syndrome G56.01 gabapentin (NEURONTIN) 300 MG capsule    methocarbamol (ROBAXIN) 500 MG tablet  5. Insomnia, unspecified type G47.00 traZODone (DESYREL) 50 MG tablet    zolpidem (AMBIEN) 10 MG tablet  6. Essential hypertension I10 CBC with Differential/Platelets  7. High  risk medication use Z79.899 CMP with eGFR([facility])  8. Mixed hyperlipidemia E78.2 Lipid Panel    TSH   START TRAZODONE 50MG AT BEDTIME TO HELP SLEEP AND DEPRESSION  Continue other medications as ordered  Recommend grief counseling  Follow up in 3 mos for grief reaction, pain, anxiety, HTN. Fasting  labs prior to appt    McBain S. Perlie Gold  [facility] [address] [place], [place] [address] [phone] Cell (Monday-Friday 8 AM - 5 PM) [phone] After 5 PM and follow prompts

## 2017-09-22 NOTE — Patient Instructions (Addendum)
START TRAZODONE  AT BEDTIME TO HELP SLEEP AND DEPRESSION  Continue other medications as ordered  Recommend grief counseling  Follow up in 3 mos for grief reaction, pain, anxiety, HTN. Fasting labs prior to appt

## 2017-10-27 ENCOUNTER — Ambulatory Visit (INDEPENDENT_AMBULATORY_CARE_PROVIDER_SITE_OTHER): Payer: Managed Care, Other (non HMO) | Admitting: Internal Medicine

## 2017-10-27 ENCOUNTER — Encounter: Payer: Self-pay | Admitting: Internal Medicine

## 2017-10-27 VITALS — BP 138/80 | HR 55 | Temp 97.8°F | Resp 10 | Ht 59.0 in | Wt 148.0 lb

## 2017-10-27 DIAGNOSIS — F411 Generalized anxiety disorder: Secondary | ICD-10-CM

## 2017-10-27 DIAGNOSIS — F4321 Adjustment disorder with depressed mood: Secondary | ICD-10-CM | POA: Diagnosis not present

## 2017-10-27 DIAGNOSIS — G4700 Insomnia, unspecified: Secondary | ICD-10-CM

## 2017-10-27 MED ORDER — ALPRAZOLAM 1 MG PO TABS: ORAL_TABLET | ORAL | 1 refills | Status: DC

## 2017-10-27 MED ORDER — ZOLPIDEM TARTRATE 10 MG PO TABS: 10.0000 mg | ORAL_TABLET | Freq: Every evening | ORAL | 1 refills | Status: DC | PRN

## 2017-10-27 NOTE — Progress Notes (Signed)
Patient ID: Victoria Floyd, female   DOB: 10/05/1965, 52 y.o.   MRN: [id number]   [facility] OFFICE  Provider: DR Elmon Kirschner  Code Status:  Goals of Care:  Advanced Directives 11/21/2016  Does Patient Have a Medical Advance Directive? No  Would patient like information on creating a medical advance directive? No - Patient declined     Chief Complaint  Patient presents with  . Follow-up    Discuss disability and mental health   . Medication Refill    Xanax and Ambien   . Referral    Referral request to psych     HPI: Patient is a 52 y.o. female seen today for an acute visit for worsening grief. She is out on short term disability due to the inability to focus and crying all day. No SI/HI. Poor appetite. She has gained 7-8 lbs in last 3 mos and she reports her hair is falling out. She has isolated herself and reports body shaking and at night she wants to "curl up in a ball and go to sleep".   Past Medical History:  Diagnosis Date  . Allergy   . Anxiety   . Chronic constipation   . Hypertension    hx of  . Insomnia     Past Surgical History:  Procedure Laterality Date  . ANTERIOR CERVICAL DECOMP/DISCECTOMY FUSION N/A 07/12/2014   Procedure: ANTERIOR CERVICAL DECOMPRESSION/DISCECTOMY FUSION 2 LEVELS;  Surgeon: [name], MD;  Location: [facility] OR;  Service: Orthopedics;  Laterality: N/A;  Anterior cervical decompression fusion, cervical 5-6, cervical 6-7 with instrumentation, allograft  . BUNIONECTOMY Bilateral   . CERVICAL DISC SURGERY  07/12/2014   C5 C6 C7    DR [name]  . TUBAL LIGATION    . WISDOM TOOTH EXTRACTION       reports that she has quit smoking. She quit after 15.00 years of use. She has never used smokeless tobacco. She reports that she drinks alcohol. She reports that she does not use drugs. Social History   Socioeconomic History  . Marital status: Divorced    Spouse name: Not on file  . Number of children: Not on file  . Years of education: Not  on file  . Highest education level: Not on file  Occupational History  . Not on file  Social Needs  . Financial resource strain: Not on file  . Food insecurity:    Worry: Not on file    Inability: Not on file  . Transportation needs:    Medical: Not on file    Non-medical: Not on file  Tobacco Use  . Smoking status: Former Smoker    Years: 15.00  . Smokeless tobacco: Never Used  . Tobacco comment: Quit age 38   Substance and Sexual Activity  . Alcohol use: Yes    Comment: occasional  . Drug use: No  . Sexual activity: Not on file  Lifestyle  . Physical activity:    Days per week: Not on file    Minutes per session: Not on file  . Stress: Not on file  Relationships  . Social connections:    Talks on phone: Not on file    Gets together: Not on file    Attends religious service: Not on file    Active member of club or organization: Not on file    Attends meetings of clubs or organizations: Not on file    Relationship status: Not on file  . Intimate partner violence:  Fear of current or ex partner: Not on file    Emotionally abused: Not on file    Physically abused: Not on file    Forced sexual activity: Not on file  Other Topics Concern  . Not on file  Social History Narrative   DIET: I eat healthy       DO YOU DRINK/EAT THINGS WITH CAFFEINE: Yes      MARITAL STATUS: Divorced      WHAT YEAR WERE YOU MARRIED: 1992      DO YOU LIVE IN A HOUSE, APARTMENT, ASSISTED LIVING, CONDO TRAILER ETC.:  House      IS IT ONE OR MORE STORIES: Yes      HOW MANY PERSONS LIVE IN YOUR HOME: 1      DO YOU HAVE PETS IN YOUR HOME: 2 cats      CURRENT OR PAST PROFESSION: machinist/ EMT      DO YOU EXERCISE: yes      WHAT TYPE AND HOW OFTEN: occasionally    Family History  Problem Relation Age of Onset  . Colon polyps Mother   . Colon cancer Father [id number]       passed away age 52   . Esophageal cancer Neg Hx   . Rectal cancer Neg Hx   . Stomach cancer Neg Hx   . Breast  cancer Neg Hx     Allergies  Allergen Reactions  . Codeine Itching    Outpatient Encounter Medications as of 10/27/2017  Medication Sig  . ALPRAZolam (XANAX) 1 MG tablet Take one tablet by mouth twice daily as needed for anxiety  . buPROPion (WELLBUTRIN XL) 150 MG 24 hr tablet Take 2 tabs po in AM and 1 tab in PM  . fluticasone (FLONASE) 50 MCG/ACT nasal spray PLACE 2 SPRAYS INTO BOTH NOSTRILS DAILY AS NEEDED FOR ALLERGIES OR RHINITIS  . gabapentin (NEURONTIN) 300 MG capsule Take 1 capsule (300 mg total) by mouth 2 (two) times daily.  . methocarbamol (ROBAXIN) 500 MG tablet Take 1 tablet (500 mg total) by mouth 2 (two) times daily.  . metoprolol tartrate (LOPRESSOR) 100 MG tablet TAKE 2 TABLETS BY MOUTH DAILY  . Multiple Vitamins-Minerals (MULTIVITAMIN WITH MINERALS) tablet Take 1 tablet by mouth daily.  Marland Kitchen. zolpidem (AMBIEN) 10 MG tablet Take 1 tablet (10 mg total) by mouth at bedtime as needed for sleep.  . [DISCONTINUED] traZODone (DESYREL) 50 MG tablet Take 1 tablet (50 mg total) by mouth at bedtime.   No facility-administered encounter medications on file as of 10/27/2017.     Review of Systems:  Review of Systems  Health Maintenance  Topic Date Due  . PAP SMEAR  04/07/2018 (Originally 10/09/1986)  . HIV Screening  04/07/2018 (Originally 10/08/1980)  . INFLUENZA VACCINE  12/24/2017  . MAMMOGRAM  06/03/2019  . COLONOSCOPY  08/12/2021  . TETANUS/TDAP  02/23/2025    Physical Exam: Vitals:   10/27/17 1525  BP: 138/80  Pulse: (!) 55  Resp: 10  Temp: 97.8 F (36.6 C)  TempSrc: Oral  SpO2: 98%  Weight: 148 lb (67.1 kg)  Height: 4\' 11"  (1.499 m)   Body mass index is 29.89 kg/m. Physical Exam  Constitutional: She is oriented to person, place, and time. She appears well-developed and well-nourished.  Tearful in NAD  Neurological: She is alert and oriented to person, place, and time.  Skin: Skin is warm and dry. No rash noted.  Psychiatric: Her speech is normal and behavior  is normal. Judgment and thought  content normal. She is not actively hallucinating. Cognition and memory are normal. She exhibits a depressed mood.    Labs reviewed: Basic Metabolic Panel: Recent Labs    11/21/16 1510  NA 141  K 4.3  CL 107  CO2 22  GLUCOSE 80  BUN 13  CREATININE 1.07*  CALCIUM 9.5  TSH 1.21   Liver Function Tests: Recent Labs    11/21/16 1510  AST 17  ALT 9  ALKPHOS 73  BILITOT 0.4  PROT 7.0  ALBUMIN 4.4   No results for input(s): LIPASE, AMYLASE in the last 8760 hours. No results for input(s): AMMONIA in the last 8760 hours. CBC: Recent Labs    11/21/16 1510  WBC 5.3  NEUTROABS 3,233  HGB 13.5  HCT 40.4  MCV 96.0  PLT 269   Lipid Panel: Recent Labs    11/21/16 1510  CHOL 218*  HDL 67  LDLCALC 132*  TRIG 96  CHOLHDL 3.3   No results found for: HGBA1C  Procedures since last visit: No results found.  Assessment/Plan   ICD-10-CM   1. Grief reaction F43.21   2. GAD (generalized anxiety disorder) F41.1 ALPRAZolam (XANAX) 1 MG tablet  3. Insomnia, unspecified type G47.00 zolpidem (AMBIEN) 10 MG tablet   TAKE TRAZODONE - CUT PILL IN HALF AND TAKE AT BEDTIME; she has Rx at home  Continue other medications as ordered  PLEASE CALL PSYCH PROVIDER TO SCHEDULE NEW CONSULTATION - list provided  Follow up in 1 month for grief reaction   Jenni S. Ancil Linsey  [facility] [address] [place], [place] [address] [phone] Cell (Monday-Friday 8 AM - 5 PM) [phone] After 5 PM and follow prompts

## 2017-10-27 NOTE — Patient Instructions (Addendum)
TAKE TRAZODONE - CUT PILL IN HALF AND TAKE AT BEDTIME  Continue other medications as ordered  PLEASE CALL PSYCH PROVIDER TO SCHEDULE NEW CONSULTATION - list provided  Follow up in 1 month for grief reaction

## 2017-11-18 ENCOUNTER — Telehealth: Payer: Self-pay | Admitting: *Deleted

## 2017-11-18 NOTE — Telephone Encounter (Signed)
Patient dropped off Short Term Disability Paperwork for Dr. Montez Moritaarter to review and fill out. Form from [employer] [facility] [employer] #[phone] Fax:[phone].  Patient stated she is seeing the psychiatrist 7/12. Wants return to work dated 01/24/18.  Placed in Dr. Celene Skeenarter's folder with last OV note attached.

## 2017-11-20 DIAGNOSIS — Z029 Encounter for administrative examinations, unspecified: Secondary | ICD-10-CM

## 2017-11-20 NOTE — Telephone Encounter (Signed)
Forms were [facility] to [facility] at [phone] and a copy has been mailed to patient. Patient has been notified.

## 2017-11-30 ENCOUNTER — Telehealth: Payer: Self-pay | Admitting: *Deleted

## 2017-11-30 NOTE — Telephone Encounter (Signed)
Patient notified and she verbalized understanding.

## 2017-11-30 NOTE — Telephone Encounter (Signed)
PA initiated via CoverMyMeds for wellbutrin. Will await determination.

## 2017-12-05 ENCOUNTER — Ambulatory Visit (INDEPENDENT_AMBULATORY_CARE_PROVIDER_SITE_OTHER): Payer: 59 | Admitting: Psychiatry

## 2017-12-05 ENCOUNTER — Ambulatory Visit (HOSPITAL_COMMUNITY): Payer: 59 | Admitting: Psychiatry

## 2017-12-05 ENCOUNTER — Other Ambulatory Visit: Payer: Self-pay

## 2017-12-05 ENCOUNTER — Encounter (HOSPITAL_COMMUNITY): Payer: Self-pay | Admitting: Psychiatry

## 2017-12-05 VITALS — BP 138/86 | Wt 148.8 lb

## 2017-12-05 DIAGNOSIS — Z634 Disappearance and death of family member: Secondary | ICD-10-CM | POA: Diagnosis not present

## 2017-12-05 DIAGNOSIS — F411 Generalized anxiety disorder: Secondary | ICD-10-CM | POA: Diagnosis not present

## 2017-12-05 DIAGNOSIS — F331 Major depressive disorder, recurrent, moderate: Secondary | ICD-10-CM | POA: Diagnosis not present

## 2017-12-05 DIAGNOSIS — F4321 Adjustment disorder with depressed mood: Secondary | ICD-10-CM

## 2017-12-05 NOTE — Progress Notes (Signed)
Psychiatric Initial Adult Assessment   Patient Identification: Victoria Floyd MRN:  [id number] Date of Evaluation:  12/05/2017 Referral Source: Kirt BoysMonica Carter, MD Chief Complaint:   Chief Complaint    Establish Care; Anxiety; Depression; Other     Visit Diagnosis:    ICD-10-CM   1. Major depressive disorder, recurrent episode, moderate (HCC) F33.1   2. GAD (generalized anxiety disorder) F41.1   3. Grief at loss of child F43.21    Z63.4     History of Present Illness:  52 years old white single female. Referred by primary care for grief and depression. Patient states she is here for grief and for short term disability paperwork. She lost her daughter 52 years of age in March due to possible suicide with gunshot. Says she has been feeling depressed and when she joined her work back it got worse and panic, depressed and hopelessness and she was not able to function . She was given time out and she wants to utilize the short term to recover from griefl  She continues to cry, teary when she talks about her daugher. Who moved into her new house . Says she would drink alcohol and last day she was facetime with her and she was moving her stuff. All  Looked well. Apparently there is an argument with her girlfriend . Patient continues to grief and panic . Says meds are doing what they can and does not want to change her wellbutrin, ambien  Has weight gain and excessive sleep. Some better on welllbutrin Also has been taking xanax more so night for sleep as otherwise it is difficult and has been dealing with anxiety for a while due to her son being in [employer] and Compan[employer] and other stressors in the past with her husband not getting along with her son   Modifying factor: mom, some friends  Aggravating F: grief, lost her daughter  Duration ; exacerbated since march  Has panic symptoms, teary, withdrawn, dis interested in things and amotivation with unable to focus    Associated Signs/Symptoms: Depression  Symptoms:  depressed mood, insomnia, difficulty concentrating, anxiety, loss of energy/fatigue, (Hypo) Manic Symptoms:  Distractibility, Anxiety Symptoms:  Excessive Worry, Psychotic Symptoms:  denies PTSD Symptoms:   Past Psychiatric History: anxiety  Previous Psychotropic Medications: Yes   Substance Abuse History in the last 12 months:  No.  Consequences of Substance Abuse: NA  Past Medical History:  Past Medical History:  Diagnosis Date  . Allergy   . Anxiety   . Chronic constipation   . Hypertension    hx of  . Insomnia     Past Surgical History:  Procedure Laterality Date  . ANTERIOR CERVICAL DECOMP/DISCECTOMY FUSION N/A 07/12/2014   Procedure: ANTERIOR CERVICAL DECOMPRESSION/DISCECTOMY FUSION 2 LEVELS;  Surgeon: Emilee HeroMark Leonard [name], MD;  [facility];  Service: Orthopedics;  Laterality: N/A;  Anterior cervical decompression fusion, cervical 5-6, cervical 6-7 with instrumentation, allograft  . BUNIONECTOMY Bilateral   . CERVICAL DISC SURGERY  07/12/2014   C5 C6 C7    DR [name]  . TUBAL LIGATION    . WISDOM TOOTH EXTRACTION      Family Psychiatric History: denies in past. Daughter committed suicide in march   Family History:  Family History  Problem Relation Age of Onset  . Colon polyps Mother   . Colon cancer Father 1171       passed away age 52   . Esophageal cancer Neg Hx   . Rectal cancer Neg Hx   .  Stomach cancer Neg Hx   . Breast cancer Neg Hx     Social History:   Social History   Socioeconomic History  . Marital status: Divorced    Spouse name: Not on file  . Number of children: 2  . Years of education: Not on file  . Highest education level: Associate degree: occupational, Scientist, product/process development, or vocational program  Occupational History    Comment: full time  Social Needs  . Financial resource strain: Not on file  . Food insecurity:    Worry: Never true    Inability: Never true  . Transportation needs:    Medical: No    Non-medical: No   Tobacco Use  . Smoking status: Former Smoker    Years: 15.00    Types: Cigarettes    Last attempt to quit: 12/06/2007    Years since quitting: 10.0  . Smokeless tobacco: Never Used  . Tobacco comment: Quit age 89   Substance and Sexual Activity  . Alcohol use: Yes    Comment: occasional  . Drug use: No  . Sexual activity: Yes  Lifestyle  . Physical activity:    Days per week: 0 days    Minutes per session: 0 min  . Stress: To some extent  Relationships  . Social connections:    Talks on phone: More than three times a week    Gets together: Once a week    Attends religious service: Never    Active member of club or organization: No    Attends meetings of clubs or organizations: Never    Relationship status: Divorced  Other Topics Concern  . Not on file  Social History Narrative   DIET: I eat healthy       DO YOU DRINK/EAT THINGS WITH CAFFEINE: Yes      MARITAL STATUS: Divorced      WHAT YEAR WERE YOU MARRIED: 1992      DO YOU LIVE IN A HOUSE, APARTMENT, ASSISTED LIVING, CONDO TRAILER ETC.:  House      IS IT ONE OR MORE STORIES: Yes      HOW MANY PERSONS LIVE IN YOUR HOME: 1      DO YOU HAVE PETS IN YOUR HOME: 2 cats      CURRENT OR PAST PROFESSION: machinist/ EMT      DO YOU EXERCISE: yes      WHAT TYPE AND HOW OFTEN: occasionally    Additional Social History: grew up with parents. No trauma. maried once. No  psych admission or suicide attempt  Allergies:   Allergies  Allergen Reactions  . Codeine Itching    Metabolic Disorder Labs: No results found for: HGBA1C, MPG No results found for: PROLACTIN Lab Results  Component Value Date   CHOL 218 (H) 11/21/2016   TRIG 96 11/21/2016   HDL 67 11/21/2016   CHOLHDL 3.3 11/21/2016   VLDL 19 11/21/2016   LDLCALC 132 (H) 11/21/2016     Current Medications: Current Outpatient Medications  Medication Sig Dispense Refill  . ALPRAZolam (XANAX) 1 MG tablet Take one tablet by mouth twice daily as needed for  anxiety 60 tablet 1  . buPROPion (WELLBUTRIN XL) 150 MG 24 hr tablet Take 2 tabs po in AM and 1 tab in PM 90 tablet 6  . fluticasone (FLONASE) 50 MCG/ACT nasal spray PLACE 2 SPRAYS INTO BOTH NOSTRILS DAILY AS NEEDED FOR ALLERGIES OR RHINITIS 16 g 11  . gabapentin (NEURONTIN) 300 MG capsule Take 1 capsule (300 mg total)  by mouth 2 (two) times daily. 60 capsule 6  . methocarbamol (ROBAXIN) 500 MG tablet Take 1 tablet (500 mg total) by mouth 2 (two) times daily. 60 tablet 3  . metoprolol tartrate (LOPRESSOR) 100 MG tablet TAKE 2 TABLETS BY MOUTH DAILY 60 tablet 6  . Multiple Vitamins-Minerals (MULTIVITAMIN WITH MINERALS) tablet Take 1 tablet by mouth daily.    Marland Kitchen zolpidem (AMBIEN) 10 MG tablet Take 1 tablet (10 mg total) by mouth at bedtime as needed for sleep. 30 tablet 1   No current facility-administered medications for this visit.     Neurologic: Headache: No Seizure: No Paresthesias:No  Musculoskeletal: Strength & Muscle Tone: within normal limits Gait & Station: normal Patient leans: no lean  Psychiatric Specialty Exam: Review of Systems  Cardiovascular: Negative for chest pain.  Skin: Negative for rash.  Psychiatric/Behavioral: Positive for depression and substance abuse. The patient is nervous/anxious.     Blood pressure 138/86, weight 148 lb 12.8 oz (67.5 kg), last menstrual period 07/12/2014.Body mass index is 30.05 kg/m.  General Appearance: Casual  Eye Contact:  Fair  Speech:  Slow  Volume:  Decreased  Mood:  Depressed  Affect:  Congruent and Depressed  Thought Process:  Goal Directed  Orientation:  Full (Time, Place, and Person)  Thought Content:  Rumination  Suicidal Thoughts:  No  Homicidal Thoughts:  No  Memory:  Immediate;   Fair Recent;   Fair  Judgement:  Fair  Insight:  Fair  Psychomotor Activity:  Decreased  Concentration:  Concentration: Fair and Attention Span: Fair  Recall:  [employer] of Knowledge:Fair  Language: Fair  Akathisia:  No  Handed:   Right  AIMS (if indicated):    Assets:  Desire for Improvement  ADL's:  Intact  Cognition: WNL  Sleep:  variable    Treatment Plan Summary: Medication management and Plan  as follows  1. Major depression, severe with Grief: on wellbutrin can continue. Says does not want to add more and go thru grief process. Would recommend therapy to work on coping skills and grief. Provided supportive therapy  2. GAD: on xanax . Advised not to increase. Refer to therapy 3. Insomnia: reviewed sleep hygiene. She has been taking [place] and xanax, understands the risk but says this works as is and wants to continue current meds from her provider.  More focused for getting therapy and not adjusting any meds.  Also needing short term disabilty paper work . Will fill for next 4 weeks Follow up in 4 weeks with this clinic Provided supportive therapy  More than 50% of time spent in counseling and coordination of care including patient education and review status and concerns were addressed  Patient not suicidal and has reasonable support  Advised to call in early for concerns or questions   Thresa Ross, MD 7/13/201912:23 PM

## 2017-12-08 ENCOUNTER — Telehealth: Payer: Self-pay | Admitting: *Deleted

## 2017-12-08 NOTE — Telephone Encounter (Signed)
Patient called and stated that she received a letter from her insurance company stating that they are only going to start covering ONE tablet of her Bupropion per day. Patient stated that she is taking 3 tablets daily and it works for her. Patient is wanting to know what to do. Please Advise.

## 2017-12-08 NOTE — Telephone Encounter (Signed)
We can change wellbutrin xl to 300mg  in AM and also have a 150mg  dose every PM

## 2017-12-09 MED ORDER — BUPROPION HCL ER (XL) 150 MG PO TB24: ORAL_TABLET | ORAL | 3 refills | Status: DC

## 2017-12-09 MED ORDER — BUPROPION HCL ER (XL) 300 MG PO TB24: ORAL_TABLET | ORAL | 3 refills | Status: DC

## 2017-12-09 NOTE — Telephone Encounter (Signed)
Not accepting calls. Cannot leave message. Will try again later.

## 2017-12-09 NOTE — Telephone Encounter (Signed)
Patient notified and agreed. Rx's faxed to pharmacy.  

## 2017-12-15 ENCOUNTER — Ambulatory Visit (HOSPITAL_COMMUNITY): Payer: Self-pay | Admitting: Psychiatry

## 2017-12-18 ENCOUNTER — Other Ambulatory Visit: Payer: Managed Care, Other (non HMO)

## 2017-12-21 ENCOUNTER — Encounter (HOSPITAL_COMMUNITY): Payer: Self-pay | Admitting: Licensed Clinical Social Worker

## 2017-12-21 ENCOUNTER — Ambulatory Visit (INDEPENDENT_AMBULATORY_CARE_PROVIDER_SITE_OTHER): Payer: 59 | Admitting: Licensed Clinical Social Worker

## 2017-12-21 DIAGNOSIS — F331 Major depressive disorder, recurrent, moderate: Secondary | ICD-10-CM | POA: Diagnosis not present

## 2017-12-21 DIAGNOSIS — F4321 Adjustment disorder with depressed mood: Secondary | ICD-10-CM | POA: Diagnosis not present

## 2017-12-21 DIAGNOSIS — F411 Generalized anxiety disorder: Secondary | ICD-10-CM

## 2017-12-21 DIAGNOSIS — Z634 Disappearance and death of family member: Secondary | ICD-10-CM
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
## 2017-12-21 NOTE — Progress Notes (Signed)
   THERAPIST PROGRESS NOTE  Session Time: 9:00am-10:00am  Participation Level: Active  Behavioral Response: Well GroomedAlertDepressed  Type of Therapy: Individual Therapy  Treatment Goals addressed: Diagnosis: MDD, recurrent, moderate, grief at the loss of a child, and Generalized Anxiety Disorder  Interventions: CBT and Motivational Interviewing  Summary: Drue DunMonica F Macchia is a 52 y.o. female who presents with MDD, recurrent, moderate, grief at the loss of a child, and Generalized Anxiety Disorder.   Suicidal/Homicidal: Nowithout intent/plan  Therapist Response: Johanny engaged well in CCA. She reports that her 52 year old daughter died by suicide in March 2019 and she is having a very difficult time coping. Geneve processed her relationship with daughter and identified several cognitive distortions about how she could have intervened in order to change the outcome. She also identified some PTSD sxs following the viewing of the scene and of her daughter's body post-mortem. These sxs will continue to be processed and diagnosis may change to PTSD if necessary.   Plan: Return again in 2-3 weeks.  Diagnosis: Axis I:MDD, recurrent, moderate, grief at the loss of a child, and Generalized Anxiety Disorder  Veneda MelterJessica R Tya Haughey, LCSW 12/21/2017

## 2017-12-28 ENCOUNTER — Other Ambulatory Visit: Payer: Self-pay | Admitting: Internal Medicine

## 2017-12-28 DIAGNOSIS — F411 Generalized anxiety disorder: Secondary | ICD-10-CM

## 2017-12-28 DIAGNOSIS — G4700 Insomnia, unspecified: Secondary | ICD-10-CM

## 2017-12-28 NOTE — Telephone Encounter (Signed)
A medication refill was received from pharmacy for zolpidem 10 mg and alprazolam 1 mg. Rx was called in to pharmacy after verifying last fill date, provider, and quantity on PMP [place] database.

## 2017-12-30 ENCOUNTER — Ambulatory Visit (HOSPITAL_COMMUNITY): Payer: Self-pay | Admitting: Licensed Clinical Social Worker

## 2018-01-01 ENCOUNTER — Ambulatory Visit (INDEPENDENT_AMBULATORY_CARE_PROVIDER_SITE_OTHER): Payer: Managed Care, Other (non HMO) | Admitting: Internal Medicine

## 2018-01-01 ENCOUNTER — Encounter: Payer: Self-pay | Admitting: Internal Medicine

## 2018-01-01 VITALS — BP 128/74 | HR 61 | Temp 98.0°F | Ht 59.0 in | Wt 147.6 lb

## 2018-01-01 DIAGNOSIS — F411 Generalized anxiety disorder: Secondary | ICD-10-CM | POA: Diagnosis not present

## 2018-01-01 DIAGNOSIS — G4700 Insomnia, unspecified: Secondary | ICD-10-CM

## 2018-01-01 DIAGNOSIS — G5601 Carpal tunnel syndrome, right upper limb: Secondary | ICD-10-CM

## 2018-01-01 DIAGNOSIS — F4321 Adjustment disorder with depressed mood: Secondary | ICD-10-CM | POA: Diagnosis not present

## 2018-01-01 DIAGNOSIS — M5412 Radiculopathy, cervical region: Secondary | ICD-10-CM | POA: Diagnosis not present

## 2018-01-01 MED ORDER — GABAPENTIN 600 MG PO TABS: 600.0000 mg | ORAL_TABLET | Freq: Two times a day (BID) | ORAL | 6 refills | Status: DC

## 2018-01-01 MED ORDER — METHOCARBAMOL 500 MG PO TABS: 1000.0000 mg | ORAL_TABLET | Freq: Two times a day (BID) | ORAL | 4 refills | Status: DC

## 2018-01-01 NOTE — Progress Notes (Signed)
Patient ID: Victoria Floyd, female   DOB: Jun 17, 1965, 52 y.o.   MRN: [id number]   [facility]  Provider: DR Elmon KirschnerMONICA S Kessa Fairbairn  Code Status:  Goals of Care:  Advanced Directives 11/21/2016  Does Patient Have a Medical Advance Directive? No  Would patient like information on creating a medical advance directive? No - Patient declined     Chief Complaint  Patient presents with  . Medical Management of Chronic Issues    Pt is being seen for a 3 month routine visit.   . Audit C Screeing    Score of 2    HPI: Patient is a 52 y.o. female seen today for medical management of chronic diseases.  She plans to return to work 01/25/18. She will f/u with therapist and psychiatrist next week. She states she is ready to return to work.   Sleep is interrupted and consists of nightmares at least 3 times per week - things chasing her; seeing daughter with blood around her; seeing a hole in daughter's head.   Her daughter committed suicide on Jul 25, 2017 (GSW to head) after aurgment with female significant other.   Hyperlipidemia - diet controlled. LDL 132; Tchol 218; HDL 67  FHx colon cancer -  constipation and flatulence stable since she had a colonoscopy  earlier this yr. No bloody stools. Father had hx colon CA (dx at age 52). Colonoscopy neg for polyps but she had diverticulosis. She will have repeat in 5 yrs  GAD/insomnia - mood uncontrolled on wellbutrin, alprazolam and zolpidem. In the past, she had increased stressors since dose of alprazolam reduced in July 2017 to 1 tab daily. She had frequent panic attacks with associated forgetfulness, difficulty concentrating, shaking tremors, SOB, chest tightness. She had taken xanax > 15 yrs at BID dose. Currently followed by psych. BMI 29.53  Postmenopausal symptoms - occasional mood swings;  Improved with wellbutrin. No hotflashes.   HTN - BP stable on metoprolol  Hx cervical radiculopathy - s/p anterior cervical decompression/discectomy  fusion at C5-C7 in Feb 2016 by Dr Yevette Edwardsumonski. She still has numbness/tingling right middle finger intermittently. Pain uncontrolled on robaxin and gabapentin. She is c/a CTS.  Seasonal allergy - controlled on flonase  Hx glaucoma and cats - followed by eye specialist. She wears eyeglasses  Job requires repetitive motions. She is a Chartered certified accountantmachinist  Overweight - weight up. Current BMI 29.8  Past Medical History:  Diagnosis Date  . Allergy   . Anxiety   . Chronic constipation   . Hypertension    hx of  . Insomnia     Past Surgical History:  Procedure Laterality Date  . ANTERIOR CERVICAL DECOMP/DISCECTOMY FUSION N/A 07/12/2014   Procedure: ANTERIOR CERVICAL DECOMPRESSION/DISCECTOMY FUSION 2 LEVELS;  Surgeon: Emilee HeroMark Leonard [name], MD;  [facility];  Service: Orthopedics;  Laterality: N/A;  Anterior cervical decompression fusion, cervical 5-6, cervical 6-7 with instrumentation, allograft  . BUNIONECTOMY Bilateral   . CERVICAL DISC SURGERY  07/12/2014   C5 C6 C7    DR [name]  . TUBAL LIGATION    . WISDOM TOOTH EXTRACTION       reports that she quit smoking about 10 years ago. Her smoking use included cigarettes. She quit after 15.00 years of use. She has never used smokeless tobacco. She reports that she drinks alcohol. She reports that she does not use drugs. Social History   Socioeconomic History  . Marital status: Divorced    Spouse name: Not on file  .  Number of children: 2  . Years of education: Not on file  . Highest education level: Associate degree: occupational, Scientist, product/process development, or vocational program  Occupational History    Comment: full time  Social Needs  . Financial resource strain: Not on file  . Food insecurity:    Worry: Never true    Inability: Never true  . Transportation needs:    Medical: No    Non-medical: No  Tobacco Use  . Smoking status: Former Smoker    Years: 15.00    Types: Cigarettes    Last attempt to quit: 12/06/2007    Years since quitting: 10.0    . Smokeless tobacco: Never Used  . Tobacco comment: Quit age 2   Substance and Sexual Activity  . Alcohol use: Yes    Comment: occasional  . Drug use: No  . Sexual activity: Yes  Lifestyle  . Physical activity:    Days per week: 0 days    Minutes per session: 0 min  . Stress: To some extent  Relationships  . Social connections:    Talks on phone: More than three times a week    Gets together: Once a week    Attends religious service: Never    Active member of club or organization: No    Attends meetings of clubs or organizations: Never    Relationship status: Divorced  . Intimate partner violence:    Fear of current or ex partner: No    Emotionally abused: No    Physically abused: No    Forced sexual activity: No  Other Topics Concern  . Not on file  Social History Narrative   DIET: I eat healthy       DO YOU DRINK/EAT THINGS WITH CAFFEINE: Yes      MARITAL STATUS: Divorced      WHAT YEAR WERE YOU MARRIED: 1992      DO YOU LIVE IN A HOUSE, APARTMENT, ASSISTED LIVING, CONDO TRAILER ETC.:  House      IS IT ONE OR MORE STORIES: Yes      HOW MANY PERSONS LIVE IN YOUR HOME: 1      DO YOU HAVE PETS IN YOUR HOME: 2 cats      CURRENT OR PAST PROFESSION: machinist/ EMT      DO YOU EXERCISE: yes      WHAT TYPE AND HOW OFTEN: occasionally    Family History  Problem Relation Age of Onset  . Colon polyps Mother   . Colon cancer Father 74       passed away age 50   . Esophageal cancer Neg Hx   . Rectal cancer Neg Hx   . Stomach cancer Neg Hx   . Breast cancer Neg Hx     Allergies  Allergen Reactions  . Codeine Itching    Outpatient Encounter Medications as of 01/01/2018  Medication Sig  . ALPRAZolam (XANAX) 1 MG tablet TAKE 1 TABLET BY MOUTH TWICE DAILY AS NEEDED FOR ANXIETY  . buPROPion (WELLBUTRIN XL) 150 MG 24 hr tablet Take one tablet by mouth once daily in the evening  . buPROPion (WELLBUTRIN XL) 300 MG 24 hr tablet Take one tablet by mouth once daily in  the morning  . fluticasone (FLONASE) 50 MCG/ACT nasal spray PLACE 2 SPRAYS INTO BOTH NOSTRILS DAILY AS NEEDED FOR ALLERGIES OR RHINITIS  . gabapentin (NEURONTIN) 300 MG capsule Take 1 capsule (300 mg total) by mouth 2 (two) times daily.  . methocarbamol (ROBAXIN) 500 MG  tablet Take 1 tablet (500 mg total) by mouth 2 (two) times daily.  . metoprolol tartrate (LOPRESSOR) 100 MG tablet TAKE 2 TABLETS BY MOUTH DAILY  . Multiple Vitamins-Minerals (MULTIVITAMIN WITH MINERALS) tablet Take 1 tablet by mouth daily.  Marland Kitchen zolpidem (AMBIEN) 10 MG tablet TAKE 1 TABLET BY MOUTH ONCE DAILY AT BEDTIME AS NEEDED FOR SLEEP   No facility-administered encounter medications on file as of 01/01/2018.     Review of Systems:  Review of Systems  Neurological: Positive for numbness.  Psychiatric/Behavioral: Positive for sleep disturbance. The patient is nervous/anxious.   All other systems reviewed and are negative.   Health Maintenance  Topic Date Due  . INFLUENZA VACCINE  12/24/2017  . PAP SMEAR  04/07/2018 (Originally 10/09/1986)  . HIV Screening  04/07/2018 (Originally 10/08/1980)  . MAMMOGRAM  06/03/2019  . COLONOSCOPY  08/12/2021  . TETANUS/TDAP  02/23/2025    Physical Exam: Vitals:   01/01/18 1118  BP: 128/74  Pulse: 61  Temp: 98 F (36.7 C)  TempSrc: Oral  SpO2: 97%  Weight: 147 lb 9.6 oz (67 kg)  Height: 4\' 11"  (1.499 m)   Body mass index is 29.81 kg/m. Physical Exam  Constitutional: She appears well-developed and well-nourished.  HENT:  Mouth/Throat: Oropharynx is clear and moist. No oropharyngeal exudate.  MMM; no oral thrush  Eyes: Pupils are equal, round, and reactive to light. No scleral icterus.  Neck: Neck supple. Carotid bruit is not present. No tracheal deviation present. No thyromegaly present.  Cardiovascular: Normal rate, regular rhythm, normal heart sounds and intact distal pulses. Exam reveals no gallop and no friction rub.  No murmur heard. No LE edema b/l. no calf TTP.     Pulmonary/Chest: Effort normal and breath sounds normal. No stridor. No respiratory distress. She has no wheezes. She has no rales.  Abdominal: Soft. Normal appearance and bowel sounds are normal. She exhibits no distension and no mass. There is no hepatomegaly. There is no tenderness. There is no rigidity, no rebound and no guarding. No hernia.  Musculoskeletal: She exhibits edema and tenderness.  Right shoulder with reduced ROM and swelling  Lymphadenopathy:    She has no cervical adenopathy.  Neurological: She is alert. She has normal reflexes.  Skin: Skin is warm and dry. No rash noted.  She has a new tattoo on her right anterior forearm to honor her deceased daughter  Psychiatric: Her behavior is normal. Thought content normal. Her mood appears anxious. She exhibits a depressed mood.    Labs reviewed: Basic Metabolic Panel: No results for input(s): NA, K, CL, CO2, GLUCOSE, BUN, CREATININE, CALCIUM, MG, PHOS, TSH in the last 8760 hours. Liver Function Tests: No results for input(s): AST, ALT, ALKPHOS, BILITOT, PROT, ALBUMIN in the last 8760 hours. No results for input(s): LIPASE, AMYLASE in the last 8760 hours. No results for input(s): AMMONIA in the last 8760 hours. CBC: No results for input(s): WBC, NEUTROABS, HGB, HCT, MCV, PLT in the last 8760 hours. Lipid Panel: No results for input(s): CHOL, HDL, LDLCALC, TRIG, CHOLHDL, LDLDIRECT in the last 8760 hours. No results found for: HGBA1C  Procedures since last visit: No results found.  Assessment/Plan   ICD-10-CM   1. Grief reaction - uncontrolled F43.21   2. Cervical radiculopathy M54.12 gabapentin (NEURONTIN) 600 MG tablet    methocarbamol (ROBAXIN) 500 MG tablet  3. Right carpal tunnel syndrome G56.01 gabapentin (NEURONTIN) 600 MG tablet    methocarbamol (ROBAXIN) 500 MG tablet  4. GAD (generalized anxiety disorder) F41.1  5. Insomnia, unspecified type G47.00      INCREASE METHOCARBAMOL 500MG  TAKE 2 TABS 2 TIMES DAILY  FOR MUSCLE SPASM AND PAIN  INCREASE GABAPENTIN 600MG  2 TIMES DAILY  CONTINUE OTHER MEDICATIONS AS ORDERED  Follow up with mental health provider as scheduled  Will call with lab results  Follow up in 3 mos with Shanda Bumps for grief, chronic muscle spasm, hyperlipidemia  Tamirra S. Ancil Linsey  [facility] [address] [place], [place] [address] [phone] Cell (Monday-Friday 8 AM - 5 PM) [phone] After 5 PM and follow prompts

## 2018-01-01 NOTE — Patient Instructions (Addendum)
INCREASE METHOCARBAMOL 500MG  TAKE 2 TABS 2 TIMES DAILY FOR MUSCLE SPASM AND PAIN  INCREASE GABAPENTIN 600MG  2 TIMES DAILY  CONTINUE OTHER MEDICATIONS AS ORDERED  Follow up with mental health provider as scheduled  Follow up in 3 mos with [name] for grief, chronic muscle spasm, hyperlipidemia

## 2018-01-02 LAB — COMPLETE METABOLIC PANEL WITH GFR
AG Ratio: 1.6 (calc) (ref 1.0–2.5)
ALKALINE PHOSPHATASE (APISO): 68 U/L (ref 33–130)
ALT: 15 U/L (ref 6–29)
AST: 20 U/L (ref 10–35)
Albumin: 4.6 g/dL (ref 3.6–5.1)
BILIRUBIN TOTAL: 0.4 mg/dL (ref 0.2–1.2)
BUN: 17 mg/dL (ref 7–25)
CHLORIDE: 108 mmol/L (ref 98–110)
CO2: 23 mmol/L (ref 20–32)
Calcium: 9.6 mg/dL (ref 8.6–10.4)
Creat: 1.04 mg/dL (ref 0.50–1.05)
GFR, Est African American: 72 mL/min/{1.73_m2} (ref >=60)
GFR, Est Non African American: 62 mL/min/{1.73_m2} (ref >=60)
GLUCOSE: 99 mg/dL (ref 65–99)
Globulin: 2.8 g/dL (calc) (ref 1.9–3.7)
Potassium: 4.5 mmol/L (ref 3.5–5.3)
Sodium: 140 mmol/L (ref 135–146)
Total Protein: 7.4 g/dL (ref 6.1–8.1)

## 2018-01-02 LAB — CBC WITH DIFFERENTIAL/PLATELET
BASOS PCT: 0.6 %
Basophils Absolute: 31 cells/uL (ref 0–200)
EOS ABS: 78 {cells}/uL (ref 15–500)
Eosinophils Relative: 1.5 %
HCT: 40.5 % (ref 35.0–45.0)
HEMOGLOBIN: 13.7 g/dL (ref 11.7–15.5)
LYMPHS ABS: 1612 {cells}/uL (ref 850–3900)
MCH: 32.3 pg (ref 27.0–33.0)
MCHC: 33.8 g/dL (ref 32.0–36.0)
MCV: 95.5 fL (ref 80.0–100.0)
MPV: 8.9 fL (ref 7.5–12.5)
Monocytes Relative: 9.6 %
Neutro Abs: 2980 cells/uL (ref 1500–7800)
Neutrophils Relative %: 57.3 %
PLATELETS: 275 10*3/uL (ref 140–400)
RBC: 4.24 10*6/uL (ref 3.80–5.10)
RDW: 11.8 % (ref 11.0–15.0)
TOTAL LYMPHOCYTE: 31 %
WBC: 5.2 10*3/uL (ref 3.8–10.8)
WBCMIX: 499 {cells}/uL (ref 200–950)

## 2018-01-02 LAB — TSH: TSH: 2.33 mIU/L

## 2018-01-02 LAB — LIPID PANEL
CHOL/HDL RATIO: 3.7 (calc) (ref <5.0)
Cholesterol: 251 mg/dL — ABNORMAL HIGH (ref <200)
HDL: 68 mg/dL (ref >50)
LDL CHOLESTEROL (CALC): 164 mg/dL — AB
NON-HDL CHOLESTEROL (CALC): 183 mg/dL — AB (ref <130)
TRIGLYCERIDES: 87 mg/dL (ref <150)

## 2018-01-06 ENCOUNTER — Encounter

## 2018-01-06 ENCOUNTER — Ambulatory Visit (HOSPITAL_COMMUNITY): Payer: Self-pay | Admitting: Licensed Clinical Social Worker

## 2018-01-07 ENCOUNTER — Ambulatory Visit (HOSPITAL_COMMUNITY): Payer: Self-pay | Admitting: Psychiatry

## 2018-01-12 ENCOUNTER — Telehealth: Payer: Self-pay | Admitting: *Deleted

## 2018-01-12 ENCOUNTER — Encounter

## 2018-01-12 ENCOUNTER — Ambulatory Visit (HOSPITAL_COMMUNITY): Payer: Self-pay | Admitting: Licensed Clinical Social Worker

## 2018-01-12 NOTE — Telephone Encounter (Signed)
What does she mean they are not working? It is a higher dose than before?

## 2018-01-12 NOTE — Telephone Encounter (Signed)
Patient calling asking to be switched back to 300 mg capsules bid for gabapentin, per voicemail from pt the 600 mg is not working. Please advise

## 2018-01-13 ENCOUNTER — Encounter (HOSPITAL_COMMUNITY): Payer: Self-pay | Admitting: Psychiatry

## 2018-01-13 ENCOUNTER — Ambulatory Visit (INDEPENDENT_AMBULATORY_CARE_PROVIDER_SITE_OTHER): Payer: 59 | Admitting: Psychiatry

## 2018-01-13 ENCOUNTER — Encounter: Payer: Self-pay | Admitting: Internal Medicine

## 2018-01-13 VITALS — BP 126/72 | HR 64 | Ht <= 58 in | Wt 150.0 lb

## 2018-01-13 DIAGNOSIS — F331 Major depressive disorder, recurrent, moderate: Secondary | ICD-10-CM | POA: Diagnosis not present

## 2018-01-13 DIAGNOSIS — Z87891 Personal history of nicotine dependence: Secondary | ICD-10-CM | POA: Diagnosis not present

## 2018-01-13 DIAGNOSIS — Z634 Disappearance and death of family member: Secondary | ICD-10-CM | POA: Diagnosis not present

## 2018-01-13 DIAGNOSIS — F4321 Adjustment disorder with depressed mood: Secondary | ICD-10-CM | POA: Diagnosis not present

## 2018-01-13 MED ORDER — GABAPENTIN 300 MG PO CAPS: 600.0000 mg | ORAL_CAPSULE | Freq: Two times a day (BID) | ORAL | 3 refills | Status: DC

## 2018-01-13 NOTE — Telephone Encounter (Signed)
Ok for gabapentin 300mg  #120 take 2 caps po BID with 3 RF

## 2018-01-13 NOTE — Progress Notes (Signed)
BH MD/PA/NP OP Progress Note  01/13/2018 11:36 AM Victoria DunMonica F Mi  MRN:  [id number]  Chief Complaint: grief HPI: Victoria Floyd presents for initial intake follow-up.  She reports that she came today to get written back into work.  She has her medications managed by her primary care doctor and is comfortable to continue with that.  She reports that she is known her primary care doctor for a long time and would prefer to have prescriptions from them.  I spent time with the patient reviewing her history, particularly the incredible amount of grief that she has been suffering as a result of her daughter's suicide in March.  She reports that she has been out of work since May and she has been trying to figure out how to be able to move forward.  She reports that she has no intentions to harm herself, and she knows that she needs to be alive for her son and grandson.  She reports that she has so many mixed feelings and anger and sadness towards her daughter.  Her daughter ended her life while intoxicated, via firearm.  Patient reports that she does not have any firearms and she has no intention to harm herself.  She reports that her mood is as good as can be based on the circumstances.  She sleeps okay at night using the Ambien and Xanax.  She reports that she drinks very rarely.  She misses her job and is excited to return to work at [employer].  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  She reports it is a fun job and she wants to get back to her routine.  She is receptive to doing therapy and is going to schedule that with her employee assistance program.  I shared with her that I am leaving this clinic at the end of August and she is welcome to continue in care with providers here, or my new practice or referral for a community provider.  She would like to just continue medication management with her primary care provider and will reach out if she has any acute needs  otherwise.  Visit Diagnosis:    ICD-10-CM   1. Grief at loss of child F43.21    Z63.4   2. Major depressive disorder, recurrent episode, moderate (HCC) F33.1     Past Psychiatric History: See intake H&P for full details. Reviewed, with no updates at this time.   Past Medical History:  Past Medical History:  Diagnosis Date  . Allergy   . Anxiety   . Chronic constipation   . Hypertension    hx of  . Insomnia     Past Surgical History:  Procedure Laterality Date  . ANTERIOR CERVICAL DECOMP/DISCECTOMY FUSION N/A 07/12/2014   Procedure: ANTERIOR CERVICAL DECOMPRESSION/DISCECTOMY FUSION 2 LEVELS;  Surgeon: Emilee HeroMark Leonard [name], MD;  [facility];  Service: Orthopedics;  Laterality: N/A;  Anterior cervical decompression fusion, cervical 5-6, cervical 6-7 with instrumentation, allograft  . BUNIONECTOMY Bilateral   . CERVICAL DISC SURGERY  07/12/2014   C5 C6 C7    DR [name]  . TUBAL LIGATION    . WISDOM TOOTH EXTRACTION      Family Psychiatric History: See intake H&P for full details. Reviewed, with no updates at this time.   Family History:  Family History  Problem Relation Age of Onset  . Colon polyps Mother   . Colon cancer Father [id number]       passed away  age 52   . Esophageal cancer Neg Hx   . Rectal cancer Neg Hx   . Stomach cancer Neg Hx   . Breast cancer Neg Hx     Social History:  Social History   Socioeconomic History  . Marital status: Divorced    Spouse name: Not on file  . Number of children: 2  . Years of education: Not on file  . Highest education level: Associate degree: occupational, Scientist, product/process developmenttechnical, or vocational program  Occupational History    Comment: full time  Social Needs  . Financial resource strain: Not on file  . Food insecurity:    Worry: Never true    Inability: Never true  . Transportation needs:    Medical: No    Non-medical: No  Tobacco Use  . Smoking status: Former Smoker    Years: 15.00    Types: Cigarettes    Last attempt to  quit: 12/06/2007    Years since quitting: 10.1  . Smokeless tobacco: Never Used  . Tobacco comment: Quit age 52   Substance and Sexual Activity  . Alcohol use: Yes    Comment: occasional  . Drug use: No  . Sexual activity: Yes  Lifestyle  . Physical activity:    Days per week: 0 days    Minutes per session: 0 min  . Stress: To some extent  Relationships  . Social connections:    Talks on phone: More than three times a week    Gets together: Once a week    Attends religious service: Never    Active member of club or organization: No    Attends meetings of clubs or organizations: Never    Relationship status: Divorced  Other Topics Concern  . Not on file  Social History Narrative   DIET: I eat healthy       DO YOU DRINK/EAT THINGS WITH CAFFEINE: Yes      MARITAL STATUS: Divorced      WHAT YEAR WERE YOU MARRIED: 1992      DO YOU LIVE IN A HOUSE, APARTMENT, ASSISTED LIVING, CONDO TRAILER ETC.:  House      IS IT ONE OR MORE STORIES: Yes      HOW MANY PERSONS LIVE IN YOUR HOME: 1      DO YOU HAVE PETS IN YOUR HOME: 2 cats      CURRENT OR PAST PROFESSION: machinist/ EMT      DO YOU EXERCISE: yes      WHAT TYPE AND HOW OFTEN: occasionally    Allergies:  Allergies  Allergen Reactions  . Codeine Itching    Metabolic Disorder Labs: No results found for: HGBA1C, MPG No results found for: PROLACTIN Lab Results  Component Value Date   CHOL 251 (H) 01/01/2018   TRIG 87 01/01/2018   HDL 68 01/01/2018   CHOLHDL 3.7 01/01/2018   VLDL 19 11/21/2016   LDLCALC 164 (H) 01/01/2018   LDLCALC 132 (H) 11/21/2016   Lab Results  Component Value Date   TSH 2.33 01/01/2018   TSH 1.21 11/21/2016    Therapeutic Level Labs: No results found for: LITHIUM No results found for: VALPROATE No components found for:  CBMZ  Current Medications: Current Outpatient Medications  Medication Sig Dispense Refill  . ALPRAZolam (XANAX) 1 MG tablet TAKE 1 TABLET BY MOUTH TWICE DAILY AS  NEEDED FOR ANXIETY 60 tablet 0  . buPROPion (WELLBUTRIN XL) 150 MG 24 hr tablet Take one tablet by mouth once daily in the evening  30 tablet 3  . buPROPion (WELLBUTRIN XL) 300 MG 24 hr tablet Take one tablet by mouth once daily in the morning 30 tablet 3  . fluticasone (FLONASE) 50 MCG/ACT nasal spray PLACE 2 SPRAYS INTO BOTH NOSTRILS DAILY AS NEEDED FOR ALLERGIES OR RHINITIS 16 g 11  . gabapentin (NEURONTIN) 600 MG tablet Take 1 tablet (600 mg total) by mouth 2 (two) times daily. 60 tablet 6  . methocarbamol (ROBAXIN) 500 MG tablet Take 2 tablets (1,000 mg total) by mouth 2 (two) times daily. For muscle spasms 120 tablet 4  . metoprolol tartrate (LOPRESSOR) 100 MG tablet TAKE 2 TABLETS BY MOUTH DAILY 60 tablet 6  . Multiple Vitamins-Minerals (MULTIVITAMIN WITH MINERALS) tablet Take 1 tablet by mouth daily.    Marland Kitchen zolpidem (AMBIEN) 10 MG tablet TAKE 1 TABLET BY MOUTH ONCE DAILY AT BEDTIME AS NEEDED FOR SLEEP 30 tablet 0   No current facility-administered medications for this visit.      Musculoskeletal: Strength & Muscle Tone: within normal limits Gait & Station: normal Patient leans: N/A  Psychiatric Specialty Exam: ROS  Blood pressure 126/72, pulse 64, height 4\' 10"  (1.473 m), weight 150 lb (68 kg), last menstrual period 07/12/2014.Body mass index is 31.35 kg/m.  General Appearance: Casual and Fairly Groomed  Eye Contact:  Fair  Speech:  Clear and Coherent and Normal Rate  Volume:  Normal  Mood:  Dysphoric  Affect:  Congruent  Thought Process:  Goal Directed and Descriptions of Associations: Intact  Orientation:  Full (Time, Place, and Person)  Thought Content: Logical   Suicidal Thoughts:  No  Homicidal Thoughts:  No  Memory:  Immediate;   Fair  Judgement:  Good  Insight:  Good  Psychomotor Activity:  Normal  Concentration:  Concentration: Good  Recall:  Good  Fund of Knowledge: Good  Language: Good  Akathisia:  Negative  Handed:  Right  AIMS (if indicated): not done   Assets:  Communication Skills Desire for Improvement Financial Resources/Insurance Housing Resilience Social Support Transportation Vocational/Educational  ADL's:  Intact  Cognition: WNL  Sleep:  Fair   Screenings: PHQ2-9     Office Visit from 04/07/2017 in [facility] Office Visit from 04/04/2016 in [facility]  PHQ-2 Total Score  0  0       Assessment and Plan: Victoria Floyd is a 52 year old female suffering complex grief and trauma related to the loss of her daughter to suicide in March 2019.  It appears that this was unexpected, and her daughter was grossly intoxicated and handled her firearm in the context of an argument with her partner at the time.  Patient is understandably grief stricken, and has been off of work since May.  She would like to get back into her regular routine, and she enjoys her job.  She feels comfortable to start EAP with her employer for individual therapy, and she shares that her medication management has been controlled by her primary care providers.  She reports that she wishes to continue with her primary care providers for medication management rather than to have to see multiple providers.  She does not present with any acute safety issues or suicidality and does not engage in any substance abuse, does not have access to firearms.  She has good support from her family, and a son who is in the [employer] in [name], and identifies her grandson is a major source of affection and motivation for her to be better in terms of her  mood.  Disclosed to patient that this Clinical research associate is leaving this practice at the end of August 2019, and patients always has the right to choose their provider. Reassured patient that office will work to provide smooth transition of care whether they wish to remain at this office, or to continue with this provider, or seek alternative care options in community.  They expressed understanding.   1. Grief at loss of  child   2. Major depressive disorder, recurrent episode, moderate (HCC)     Status of current problems: unchanged  Labs Ordered: No orders of the defined types were placed in this encounter.   Labs Reviewed: NA  Collateral Obtained/Records Reviewed: Dr. Gilmore Laroche prior note  Plan:  Continued medication management with primary care clinic Follow up PRN Encourage participation in EAP  Burnard Leigh, MD 01/13/2018, 11:36 AM

## 2018-01-13 NOTE — Telephone Encounter (Signed)
Spoke with patient and she states that the capsules work better than the tablet. Pt states the 600 mg tablet does not take away the pain and gives her a headache. She would like 300 mg capsules,  take 2 in the morning and 2 in the evening. Please advise

## 2018-01-13 NOTE — Telephone Encounter (Signed)
Left message on machine that medication has been changed and sent to pharmacy, advised pt to call if any questions

## 2018-01-19 ENCOUNTER — Ambulatory Visit (HOSPITAL_COMMUNITY): Payer: Self-pay | Admitting: Licensed Clinical Social Worker

## 2018-01-29 ENCOUNTER — Other Ambulatory Visit: Payer: Self-pay | Admitting: Internal Medicine

## 2018-01-29 DIAGNOSIS — F411 Generalized anxiety disorder: Secondary | ICD-10-CM

## 2018-01-29 DIAGNOSIS — G4700 Insomnia, unspecified: Secondary | ICD-10-CM

## 2018-01-29 DIAGNOSIS — I10 Essential (primary) hypertension: Secondary | ICD-10-CM

## 2018-02-02 ENCOUNTER — Ambulatory Visit (HOSPITAL_COMMUNITY): Payer: Self-pay | Admitting: Licensed Clinical Social Worker

## 2018-02-25 ENCOUNTER — Other Ambulatory Visit: Payer: Self-pay | Admitting: Internal Medicine

## 2018-02-25 DIAGNOSIS — F411 Generalized anxiety disorder: Secondary | ICD-10-CM

## 2018-02-25 DIAGNOSIS — G4700 Insomnia, unspecified: Secondary | ICD-10-CM

## 2018-02-25 NOTE — Telephone Encounter (Signed)
A medication refill was received from pharmacy for zolpidem 10 mg and alprazolam 1 mg. Rx was pended to provider for approval  after verifying last fill date, provider, and quantity on PMP [place] database   Refills are due 02/28/18 (Sunday)

## 2018-03-30 ENCOUNTER — Other Ambulatory Visit: Payer: Self-pay | Admitting: *Deleted

## 2018-03-30 DIAGNOSIS — F411 Generalized anxiety disorder: Secondary | ICD-10-CM

## 2018-03-30 DIAGNOSIS — G4700 Insomnia, unspecified: Secondary | ICD-10-CM

## 2018-03-30 MED ORDER — ALPRAZOLAM 1 MG PO TABS: 1.0000 mg | ORAL_TABLET | Freq: Two times a day (BID) | ORAL | 0 refills | Status: DC | PRN

## 2018-03-30 MED ORDER — ZOLPIDEM TARTRATE 10 MG PO TABS: 10.0000 mg | ORAL_TABLET | Freq: Every evening | ORAL | 0 refills | Status: DC | PRN

## 2018-03-30 NOTE — Telephone Encounter (Signed)
[facility] 

## 2018-04-21 ENCOUNTER — Other Ambulatory Visit: Payer: Self-pay | Admitting: *Deleted

## 2018-04-21 MED ORDER — BUPROPION HCL ER (XL) 300 MG PO TB24: ORAL_TABLET | ORAL | 3 refills | Status: DC

## 2018-04-21 NOTE — Telephone Encounter (Signed)
[facility] 

## 2018-04-26 ENCOUNTER — Other Ambulatory Visit: Payer: Self-pay | Admitting: Nurse Practitioner

## 2018-04-26 DIAGNOSIS — G4700 Insomnia, unspecified: Secondary | ICD-10-CM

## 2018-04-26 DIAGNOSIS — F411 Generalized anxiety disorder: Secondary | ICD-10-CM

## 2018-04-26 NOTE — Telephone Encounter (Signed)
[place] Database verified and compliance confirmed   Last filled 03/30/18

## 2018-05-24 ENCOUNTER — Other Ambulatory Visit: Payer: Self-pay | Admitting: Nurse Practitioner

## 2018-05-24 DIAGNOSIS — G4700 Insomnia, unspecified: Secondary | ICD-10-CM

## 2018-05-24 DIAGNOSIS — F411 Generalized anxiety disorder: Secondary | ICD-10-CM

## 2018-05-24 NOTE — Telephone Encounter (Signed)
Database checked Last refill 04/26/2018

## 2018-05-25 ENCOUNTER — Telehealth: Payer: Self-pay

## 2018-05-25 NOTE — Telephone Encounter (Signed)
Tried calling patient to schedule follow up appointment. No answer. Left message on voicemail for patient to [facility] up appointment with Abbey ChattersJessica Eubanks.

## 2018-05-28 ENCOUNTER — Other Ambulatory Visit: Payer: Self-pay

## 2018-05-28 MED ORDER — BUPROPION HCL ER (XL) 150 MG PO TB24: ORAL_TABLET | ORAL | 0 refills | Status: DC

## 2018-05-28 NOTE — Telephone Encounter (Signed)
High medication alert duplicate medication  Ok to refill? Patient has as appointment scheduled for 06/18/2017

## 2018-06-18 ENCOUNTER — Ambulatory Visit: Payer: Self-pay | Admitting: Family

## 2018-06-18 ENCOUNTER — Ambulatory Visit: Payer: Self-pay | Admitting: Nurse Practitioner

## 2018-06-22 ENCOUNTER — Encounter: Payer: Self-pay | Admitting: Family

## 2018-06-22 ENCOUNTER — Ambulatory Visit (INDEPENDENT_AMBULATORY_CARE_PROVIDER_SITE_OTHER): Payer: Managed Care, Other (non HMO) | Admitting: Family

## 2018-06-22 VITALS — BP 122/80 | HR 60 | Temp 97.7°F | Ht <= 58 in | Wt 148.0 lb

## 2018-06-22 DIAGNOSIS — I10 Essential (primary) hypertension: Secondary | ICD-10-CM | POA: Diagnosis not present

## 2018-06-22 DIAGNOSIS — F411 Generalized anxiety disorder: Secondary | ICD-10-CM | POA: Diagnosis not present

## 2018-06-22 DIAGNOSIS — E782 Mixed hyperlipidemia: Secondary | ICD-10-CM

## 2018-06-22 DIAGNOSIS — F32A Depression, unspecified: Secondary | ICD-10-CM

## 2018-06-22 DIAGNOSIS — G4700 Insomnia, unspecified: Secondary | ICD-10-CM

## 2018-06-22 DIAGNOSIS — J302 Other seasonal allergic rhinitis: Secondary | ICD-10-CM

## 2018-06-22 DIAGNOSIS — F329 Major depressive disorder, single episode, unspecified: Secondary | ICD-10-CM

## 2018-06-22 MED ORDER — METOPROLOL TARTRATE 100 MG PO TABS: 200.0000 mg | ORAL_TABLET | Freq: Every day | ORAL | 6 refills | Status: DC

## 2018-06-22 MED ORDER — ALPRAZOLAM 1 MG PO TABS: 1.0000 mg | ORAL_TABLET | Freq: Two times a day (BID) | ORAL | 0 refills | Status: DC | PRN

## 2018-06-22 MED ORDER — ZOLPIDEM TARTRATE 10 MG PO TABS: 10.0000 mg | ORAL_TABLET | Freq: Every evening | ORAL | 0 refills | Status: DC | PRN

## 2018-06-22 NOTE — Patient Instructions (Addendum)
Follow up for fasting lab work 07/02/2018

## 2018-06-22 NOTE — Progress Notes (Signed)
Provider: Dinah [name] FNP-C   [name], Dinah C, NP  Patient Care Team: [name], Dinah C, NP as PCP - General (Family Medicine) Pyrtle, Jay M, MD as Consulting Physician (Gastroenterology)  Extended Emergency Contact Information Primary Emergency Contact: Wor[name] Address: [address]          [facility] [place], [place] [address] [place] Home Phone: [phone] Relation: Mother   Goals of care: Advanced Directive information Advanced Directives 06/22/2018  Does Patient Have a Medical Advance Directive? No  Would patient like information on creating a medical advance directive? No - Patient declined     Chief Complaint  Patient presents with  . Medical Management of Chronic Issues    Routine Visit, sinus issues   . Quality Metric Gaps    Patient is due for Flu vaccine and pap smear     HPI:  Pt is a 53 y.o. female seen today for medical management of chronic diseases.she states has been doing well no acute issues .  Allergies - has chronic nasal congestion uses Flonase and saline spray with relief.Also has a humidify that has been helpful.Has had frontal head pressure.No fever or chills  Hypertension-  on metoprolol 200 mg tablet daily.she denies any dizziness,faintness,shortness of breath or chest pain.  Insomnia - Ambien 10 mg tablet effective.Request refill today.    Generalized Anxiety- on alprazolam 1 mg tablet twice daily as needed.   Depression -states stable on Bupropion 300 mg tablet in the morning and 150 mg tablet daily in the evening.       Past Medical History:  Diagnosis Date  . Allergy   . Anxiety   . Chronic constipation   . Hypertension    hx of  . Insomnia    Past Surgical History:  Procedure Laterality Date  . ANTERIOR CERVICAL DECOMP/DISCECTOMY FUSION N/A 07/12/2014   Procedure: ANTERIOR CERVICAL DECOMPRESSION/DISCECTOMY FUSION 2 LEVELS;  Surgeon: Mark Leonard [name], MD;  Location: [facility] OR;  Service: Orthopedics;  Laterality:  N/A;  Anterior cervical decompression fusion, cervical 5-6, cervical 6-7 with instrumentation, allograft  . BUNIONECTOMY Bilateral   . CERVICAL DISC SURGERY  07/12/2014   C5 C6 C7    DR [name]  . TUBAL LIGATION    . WISDOM TOOTH EXTRACTION      Allergies  Allergen Reactions  . Codeine Itching    Allergies as of 06/22/2018      Reactions   Codeine Itching      Medication List       Accurate as of June 22, 2018  4:37 PM. Always use your most recent med list.        ALPRAZolam 1 MG tablet Commonly known as:  XANAX Take 1 tablet (1 mg total) by mouth 2 (two) times daily as needed. for anxiety   buPROPion 300 MG 24 hr tablet Commonly known as:  WELLBUTRIN XL Take one tablet by mouth once daily in the morning   buPROPion 150 MG 24 hr tablet Commonly known as:  WELLBUTRIN XL Take one tablet by mouth once daily in the evening   fluticasone 50 MCG/ACT nasal spray Commonly known as:  FLONASE PLACE 2 SPRAYS INTO BOTH NOSTRILS DAILY AS NEEDED FOR ALLERGIES OR RHINITIS   gabapentin 300 MG capsule Commonly known as:  NEURONTIN Take 2 capsules (600 mg total) by mouth 2 (two) times daily.   methocarbamol 500 MG tablet Commonly known as:  ROBAXIN Take 2 tablets (1,000 mg total) by mouth 2 (two) times daily. For muscle   spasms   metoprolol tartrate 100 MG tablet Commonly known as:  LOPRESSOR Take 2 tablets (200 mg total) by mouth daily.   multivitamin with minerals tablet Take 1 tablet by mouth daily.   zolpidem 10 MG tablet Commonly known as:  AMBIEN Take 1 tablet (10 mg total) by mouth at bedtime as needed. for sleep       Review of Systems  Constitutional: Negative for appetite change, chills, fatigue and fever.       Has had 2 lbs weight loss over 5 months though states 10 lbs weight loss per her home scale.weight loss intended states on a Ketogenic diet.   HENT: Positive for congestion and sinus pressure. Negative for ear discharge, ear pain, hearing loss,  postnasal drip, rhinorrhea, sinus pain, sneezing, sore throat and trouble swallowing.   Eyes: Negative for pain, discharge, redness, itching and visual disturbance.  Respiratory: Negative for cough, chest tightness, shortness of breath and wheezing.   Cardiovascular: Negative for chest pain, palpitations and leg swelling.  Gastrointestinal: Negative for abdominal distention, abdominal pain, constipation, diarrhea, nausea and vomiting.  Endocrine: Negative for cold intolerance, heat intolerance, polydipsia, polyphagia and polyuria.  Genitourinary: Negative for dysuria, flank pain, frequency and urgency.  Musculoskeletal: Negative for arthralgias and gait problem.  Skin: Negative for color change, pallor and rash.  Neurological: Negative for dizziness, weakness, light-headedness, numbness and headaches.  Psychiatric/Behavioral: Positive for sleep disturbance and suicidal ideas. The patient is nervous/anxious.        Request refills for anxiety and insomnia medication     Immunization History  Administered Date(s) Administered  . Influenza-Unspecified 02/24/2015  . Tdap 02/24/2015   Pertinent  Health Maintenance Due  Topic Date Due  . PAP SMEAR-Modifier  10/09/1986  . INFLUENZA VACCINE  12/24/2017  . MAMMOGRAM  06/03/2019  . COLONOSCOPY  08/12/2021   Fall Risk  01/01/2018 10/27/2017 09/22/2017 11/21/2016 07/04/2016  Falls in the past year? _0     Vitals:   06/22/18 1545  BP: 122/80  Pulse: 60  Temp: 97.7 F (36.5 C)  TempSrc: Oral  SpO2: 98%  Weight: 148 lb (67.1 kg)  Height: 4' 10" (1.473 m)   Body mass index is 30.93 kg/m. Physical Exam Vitals signs reviewed.  Constitutional:      General: She is not in acute distress.    Appearance: She is obese.  HENT:     Head: Normocephalic.     Right Ear: Tympanic membrane and ear canal normal. There is no impacted cerumen.     Left Ear: Tympanic membrane, ear canal and external ear normal. There is no impacted cerumen.      Nose: Congestion present. No rhinorrhea.     Mouth/Throat:     Mouth: Mucous membranes are moist.     Pharynx: Oropharynx is clear. No oropharyngeal exudate or posterior oropharyngeal erythema.  Eyes:     General: No scleral icterus.       Right eye: No discharge.        Left eye: No discharge.     Extraocular Movements: Extraocular movements intact.     Conjunctiva/sclera: Conjunctivae normal.     Pupils: Pupils are equal, round, and reactive to light.  Neck:     Musculoskeletal: Normal range of motion. No muscular tenderness.     Vascular: No carotid bruit.  Cardiovascular:     Rate and Rhythm: Normal rate and regular rhythm.     Pulses: Normal pulses.     Heart sounds: Normal  heart sounds. No murmur. No friction rub. No gallop.   Pulmonary:     Effort: Pulmonary effort is normal. No respiratory distress.     Breath sounds: Normal breath sounds. No wheezing, rhonchi or rales.  Chest:     Chest wall: No tenderness.  Abdominal:     General: Bowel sounds are normal. There is no distension.     Palpations: Abdomen is soft. There is no mass.     Tenderness: There is no abdominal tenderness. There is no right CVA tenderness, left CVA tenderness, guarding or rebound.  Musculoskeletal: Normal range of motion.        General: No tenderness.     Right lower leg: No edema.     Left lower leg: No edema.  Lymphadenopathy:     Cervical: No cervical adenopathy.  Skin:    General: Skin is warm and dry.     Coloration: Skin is not pale.     Findings: No erythema, lesion or rash.  Neurological:     Mental Status: She is alert and oriented to person, place, and time.     Cranial Nerves: No cranial nerve deficit.     Sensory: No sensory deficit.     Motor: No weakness.     Coordination: Coordination normal.     Gait: Gait normal.  Psychiatric:        Mood and Affect: Mood normal.        Behavior: Behavior normal.        Thought Content: Thought content normal.        Judgment: Judgment  normal.    Labs reviewed: Recent Labs    01/01/18 1233  NA 140  K 4.5  CL 108  CO2 23  GLUCOSE 99  BUN 17  CREATININE 1.04  CALCIUM 9.6   Recent Labs    01/01/18 1233  AST 20  ALT 15  BILITOT 0.4  PROT 7.4   Recent Labs    01/01/18 1233  WBC 5.2  NEUTROABS 2,980  HGB 13.7  HCT 40.5  MCV 95.5  PLT 275   Lab Results  Component Value Date   TSH 2.33 01/01/2018   No results found for: HGBA1C Lab Results  Component Value Date   CHOL 251 (H) 01/01/2018   HDL 68 01/01/2018   LDLCALC 164 (H) 01/01/2018   TRIG 87 01/01/2018   CHOLHDL 3.7 01/01/2018    Significant Diagnostic Results in last 30 days:  No results found.  Assessment/Plan 1. Essential hypertension B/p stable.Negative exam findings. - metoprolol tartrate (LOPRESSOR) 100 MG tablet; Take 2 tablets (200 mg total) by mouth daily.  Dispense: 60 tablet; Refill: 6 - CBC with Differential/Platelet; Future - CMP with eGFR([facility]); Future - TSH; Future  2. Insomnia, unspecified type Zolpidem effective. - zolpidem (AMBIEN) 10 MG tablet; Take 1 tablet (10 mg total) by mouth at bedtime as needed. for sleep  Dispense: 30 tablet; Refill: 0  3. Mixed hyperlipidemia Latest LDL 164 current on Ketogenic diet.encourage exercise at least 30 minutes three times a week.  - Lipid Panel; Future  4. GAD (generalized anxiety disorder) Stable.continue on Alprazolam.  - ALPRAZolam (XANAX) 1 MG tablet; Take 1 tablet (1 mg total) by mouth 2 (two) times daily as needed. for anxiety  Dispense: 60 tablet; Refill: 0  5. Seasonal allergies Nasal congestion reported.exam negative.continue on Flonase 2 spray daily as needed.    6. Chronic depression Mood stable.continue on Bupropion 300 mg tablet in the morning and 150 mg   tablet in the evening.  - TSH; Future  Family/ staff Communication: Reviewed plan of care with patient   Labs/tests ordered: CBC/diff,CMP,TSH level,Fasting lipid panel 07/02/2018   Next appointment: 3  months   Dinah C [name], NP   

## 2018-06-29 ENCOUNTER — Other Ambulatory Visit: Payer: Self-pay | Admitting: *Deleted

## 2018-06-29 DIAGNOSIS — M5412 Radiculopathy, cervical region: Secondary | ICD-10-CM

## 2018-06-29 DIAGNOSIS — G5601 Carpal tunnel syndrome, right upper limb: Secondary | ICD-10-CM

## 2018-06-29 MED ORDER — METHOCARBAMOL 500 MG PO TABS: 1000.0000 mg | ORAL_TABLET | Freq: Two times a day (BID) | ORAL | 2 refills | Status: DC

## 2018-06-29 NOTE — Telephone Encounter (Signed)
[facility] 

## 2018-07-22 ENCOUNTER — Other Ambulatory Visit: Payer: Self-pay | Admitting: Nurse Practitioner

## 2018-07-22 NOTE — Telephone Encounter (Signed)
Patient follows up with Psychiatry.will need to contact psych.

## 2018-07-22 NOTE — Telephone Encounter (Signed)
Received high dose alert for Wellbutrin 150mg  and Wellbutrin 300mg 

## 2018-07-23 NOTE — Telephone Encounter (Signed)
Received high dose alert on Wellbutrin 300mg  and Wellbutrin 150mg 

## 2018-07-28 ENCOUNTER — Other Ambulatory Visit: Payer: Self-pay | Admitting: *Deleted

## 2018-07-28 DIAGNOSIS — F411 Generalized anxiety disorder: Secondary | ICD-10-CM

## 2018-07-28 DIAGNOSIS — G4700 Insomnia, unspecified: Secondary | ICD-10-CM

## 2018-07-28 MED ORDER — ZOLPIDEM TARTRATE 10 MG PO TABS: 10.0000 mg | ORAL_TABLET | Freq: Every evening | ORAL | 0 refills | Status: DC | PRN

## 2018-07-28 MED ORDER — ALPRAZOLAM 1 MG PO TABS: 1.0000 mg | ORAL_TABLET | Freq: Two times a day (BID) | ORAL | 0 refills | Status: DC | PRN

## 2018-07-28 NOTE — Telephone Encounter (Signed)
Patient Requested refill [place] Database Verified LR:06/22/2018 Phoned Rx's to pharmacy.

## 2018-08-10 ENCOUNTER — Telehealth (INDEPENDENT_AMBULATORY_CARE_PROVIDER_SITE_OTHER): Payer: 59 | Admitting: *Deleted

## 2018-08-10 DIAGNOSIS — R05 Cough: Secondary | ICD-10-CM | POA: Diagnosis not present

## 2018-08-10 DIAGNOSIS — Z20828 Contact with and (suspected) exposure to other viral communicable diseases: Secondary | ICD-10-CM | POA: Diagnosis not present

## 2018-08-10 DIAGNOSIS — R509 Fever, unspecified: Secondary | ICD-10-CM | POA: Diagnosis not present

## 2018-08-10 NOTE — Telephone Encounter (Signed)
Person Under Monitoring Name: Victoria Floyd  Location: [address] [place] [address]  Patient call returned at [phone] Patient called [facility] CMA stated was send home from work because she was sick and told to her not to come back until she has been tested for COVID-19.Patient states cannot go today because she eat something yesterday that was sugar free did not know it had dextrose which usually gives her diarrhea.she states overall not feeling good at all. Feeling weak.   Of note patient states visited her son in [place] [employer] a week ago.she states son and family has been placed on Quarantine after he was exposed to a marine with suspected COVID 19.  Sore throat /Headache - started on 08/03/2018 sore throat has improved but still has scratchy voice.  Fever- Patient states had fever 101.0 on 08/04/2018 wednesday but has improved since then.  Coughing - non-productive dry cough.Denies any shortness of breath,wheezing or chest pain.   Diarrhea - as above.no abdominal pain,nausea or vomiting.   Plan:  - Lab [phone] for COVID 19 testing ordered.  - Discussed with importance of self Quarantine x 14 days and avoid contact with other persons per infection prevention:  Infection Prevention Recommendations for Individuals Confirmed to have, or Being Evaluated for, 2019 Novel Coronavirus (COVID-19) Infection Who Receive Care at Home  Individuals who are confirmed to have, or are being evaluated for, COVID-19 should follow the prevention steps below until a healthcare provider or local or state health department says they can return to normal activities.  Stay home except to get medical care You should restrict activities outside your home, except for getting medical care. Do not go to work, school, or public areas, and do not use public transportation or taxis.  Call ahead before visiting your doctor Before your medical appointment, call the healthcare provider and tell them that  you have, or are being evaluated for, COVID-19 infection. This will help the healthcare provider's office take steps to keep other people from getting infected. Ask your healthcare provider to call the local or state health department.  Monitor your symptoms Seek prompt medical attention if your illness is worsening (e.g., difficulty breathing). Before going to your medical appointment, call the healthcare provider and tell them that you have, or are being evaluated for, COVID-19 infection. Ask your healthcare provider to call the local or state health department.  Wear a facemask You should wear a facemask that covers your nose and mouth when you are in the same room with other people and when you visit a healthcare provider. People who live with or visit you should also wear a facemask while they are in the same room with you.  Separate yourself from other people in your home As much as possible, you should stay in a different room from other people in your home. Also, you should use a separate bathroom, if available.  Avoid sharing household items You should not share dishes, drinking glasses, cups, eating utensils, towels, bedding, or other items with other people in your home. After using these items, you should wash them thoroughly with soap and water.  Cover your coughs and sneezes Cover your mouth and nose with a tissue when you cough or sneeze, or you can cough or sneeze into your sleeve. Throw used tissues in a lined trash can, and immediately wash your hands with soap and water for at least 20 seconds or use an alcohol-based hand rub.  Wash your Interior and spatial designer  your hands often and thoroughly with soap and water for at least 20 seconds. You can use an alcohol-based hand sanitizer if soap and water are not available and if your hands are not visibly dirty. Avoid touching your eyes, nose, and mouth with unwashed hands.   Prevention Steps for Caregivers and Household Members of  Individuals Confirmed to have, or Being Evaluated for, COVID-19 Infection Being Cared for in the Home  If you live with, or provide care at home for, a person confirmed to have, or being evaluated for, COVID-19 infection please follow these guidelines to prevent infection:  Follow healthcare provider's instructions Make sure that you understand and can help the patient follow any healthcare provider instructions for all care.  Provide for the patient's basic needs You should help the patient with basic needs in the home and provide support for getting groceries, prescriptions, and other personal needs.  Monitor the patient's symptoms If they are getting sicker, call his or her medical provider and tell them that the patient has, or is being evaluated for, COVID-19 infection. This will help the healthcare provider's office take steps to keep other people from getting infected. Ask the healthcare provider to call the local or state health department.  Limit the number of people who have contact with the patient  If possible, have only one caregiver for the patient.  Other household members should stay in another home or place of residence. If this is not possible, they should stay  in another room, or be separated from the patient as much as possible. Use a separate bathroom, if available.  Restrict visitors who do not have an essential need to be in the home.  Keep older adults, very young children, and other sick people away from the patient Keep older adults, very young children, and those who have compromised immune systems or chronic health conditions away from the patient. This includes people with chronic heart, lung, or kidney conditions, diabetes, and cancer.  Ensure good ventilation Make sure that shared spaces in the home have good air flow, such as from an air conditioner or an opened window, weather permitting.  Wash your hands often  Wash your hands often and  thoroughly with soap and water for at least 20 seconds. You can use an alcohol based hand sanitizer if soap and water are not available and if your hands are not visibly dirty.  Avoid touching your eyes, nose, and mouth with unwashed hands.  Use disposable paper towels to dry your hands. If not available, use dedicated cloth towels and replace them when they become wet.  Wear a facemask and gloves  Wear a disposable facemask at all times in the room and gloves when you touch or have contact with the patient's blood, body fluids, and/or secretions or excretions, such as sweat, saliva, sputum, nasal mucus, vomit, urine, or feces.  Ensure the mask fits over your nose and mouth tightly, and do not touch it during use.  Throw out disposable facemasks and gloves after using them. Do not reuse.  Wash your hands immediately after removing your facemask and gloves.  If your personal clothing becomes contaminated, carefully remove clothing and launder. Wash your hands after handling contaminated clothing.  Place all used disposable facemasks, gloves, and other waste in a lined container before disposing them with other household waste.  Remove gloves and wash your hands immediately after handling these items.  Do not share dishes, glasses, or other household items with the patient  Avoid sharing household items. You should not share dishes, drinking glasses, cups, eating utensils, towels, bedding, or other items with a patient who is confirmed to have, or being evaluated for, COVID-19 infection.  After the person uses these items, you should wash them thoroughly with soap and water.  Wash laundry thoroughly  Immediately remove and wash clothes or bedding that have blood, body fluids, and/or secretions or excretions, such as sweat, saliva, sputum, nasal mucus, vomit, urine, or feces, on them.  Wear gloves when handling laundry from the patient.  Read and follow directions on labels of laundry or  clothing items and detergent. In general, wash and dry with the warmest temperatures recommended on the label.  Clean all areas the individual has used often  Clean all touchable surfaces, such as counters, tabletops, doorknobs, bathroom fixtures, toilets, phones, keyboards, tablets, and bedside tables, every day. Also, clean any surfaces that may have blood, body fluids, and/or secretions or excretions on them.  Wear gloves when cleaning surfaces the patient has come in contact with.  Use a diluted bleach solution (e.g., dilute bleach with 1 part bleach and 10 parts water) or a household disinfectant with a label that says EPA-registered for coronaviruses. To make a bleach solution at home, add 1 tablespoon of bleach to 1 quart (4 cups) of water. For a larger supply, add  cup of bleach to 1 gallon (16 cups) of water.  Read labels of cleaning products and follow recommendations provided on product labels. Labels contain instructions for safe and effective use of the cleaning product including precautions you should take when applying the product, such as wearing gloves or eye protection and making sure you have good ventilation during use of the product.  Remove gloves and wash hands immediately after cleaning.  Monitor yourself for signs and symptoms of illness Caregivers and household members are considered close contacts, should monitor their health, and will be asked to limit movement outside of the home to the extent possible. Follow the monitoring steps for close contacts listed on the symptom monitoring form.   ? If you have additional questions, contact your local health department or call the epidemiologist on call at [phone] (available 24/7). ? This guidance is subject to change. For the most up-to-date guidance from [facility], please refer to their website: [email or web address].hu  Spent Time: 8 : 54 counseling; reviewing medical  record; tests; labs; and developing future plan of care.

## 2018-08-10 NOTE — Telephone Encounter (Signed)
Patient called and stated that her work sent her home because she was sick and told her that she could not come back until she was tested for the COVID-19. Patient stated that she cannot go today to be tested because she had something to eat that upset her stomach. Stated that she could go tomorrow. Please Advise.    Travel: went to [employer] last week (son Marine was exposed and him and his family has been Nurse, mental health) Fever: Yes stated she had a fever 3 days last week.  SOB: No ST: Yes in the beginning, better now Cough: Yes, dry hacking cough Sx since Tuesday last week   Time: 7:23

## 2018-08-11 ENCOUNTER — Other Ambulatory Visit: Payer: Self-pay

## 2018-08-11 ENCOUNTER — Telehealth: Payer: Self-pay | Admitting: Family

## 2018-08-11 DIAGNOSIS — Z20828 Contact with and (suspected) exposure to other viral communicable diseases: Secondary | ICD-10-CM

## 2018-08-11 NOTE — Telephone Encounter (Signed)
Please call patient to check if symptoms have improved or worsening.Also check if she went to get tested for COVID-19

## 2018-08-12 NOTE — Telephone Encounter (Signed)
Test was performed and awaiting results.   Patient stated she is Still feeling kinda "crappy". Feeling alittle better than before though. Still has the hacking cough, no fever.

## 2018-08-12 NOTE — Telephone Encounter (Signed)
LMOM to return call.

## 2018-08-12 NOTE — Telephone Encounter (Signed)
Will wait for patient to call back or try to reach patient/POA  later today if she does not call.

## 2018-08-12 NOTE — Telephone Encounter (Signed)
Order for Covid-19 was released yesterday, refer to lab tab

## 2018-08-12 NOTE — Telephone Encounter (Signed)
Okay, Thank You

## 2018-08-19 ENCOUNTER — Telehealth: Payer: Self-pay | Admitting: *Deleted

## 2018-08-19 NOTE — Telephone Encounter (Signed)
Patient is calling requesting her COVID 2 test results she had done on 08/11/2018 by the [facility].  In orders it states testing is still Active.  Tried calling the number on the Req. Order But no answer.  Please Advise.

## 2018-08-19 NOTE — Telephone Encounter (Signed)
Patient notified and agreed.  

## 2018-08-19 NOTE — Telephone Encounter (Signed)
Notify patient still waiting for COVID-19 results.will call once results are received.

## 2018-08-19 NOTE — Telephone Encounter (Signed)
Victoria Floyd, please also inform patient that the usual turn around time is 2-5 days yet with the increase in test to process we were informed that it could be longer. Advise her that we will follow-up on results daily being that it is over the turn around time. This way we are being as transparent as possible and not giving a generic response  Thanks CB

## 2018-08-22 LAB — NOVEL CORONAVIRUS, NAA: SARS-CoV-2, NAA: NOT DETECTED

## 2018-08-23 ENCOUNTER — Encounter: Payer: Self-pay | Admitting: *Deleted

## 2018-08-23 NOTE — Telephone Encounter (Signed)
May send letter that patient may return to work COVID-19 results negative.

## 2018-08-23 NOTE — Telephone Encounter (Signed)
Letter printed.  Patient will call us back with a fax number to fax to her workplace.

## 2018-08-23 NOTE — Telephone Encounter (Signed)
Patient called back and stated that she wants to return to work tomorrow but needs a letter stating that she can and that her COVID-19 test were Negative. Please Advise.

## 2018-08-23 NOTE — Telephone Encounter (Signed)
Test came back NEGATIVE. Patient notified and agreed.

## 2018-08-27 ENCOUNTER — Other Ambulatory Visit: Payer: Self-pay

## 2018-08-27 ENCOUNTER — Ambulatory Visit (INDEPENDENT_AMBULATORY_CARE_PROVIDER_SITE_OTHER): Payer: 59 | Admitting: Nurse Practitioner

## 2018-08-27 ENCOUNTER — Encounter: Payer: Self-pay | Admitting: Nurse Practitioner

## 2018-08-27 DIAGNOSIS — F329 Major depressive disorder, single episode, unspecified: Secondary | ICD-10-CM

## 2018-08-27 DIAGNOSIS — I10 Essential (primary) hypertension: Secondary | ICD-10-CM | POA: Diagnosis not present

## 2018-08-27 DIAGNOSIS — E782 Mixed hyperlipidemia: Secondary | ICD-10-CM

## 2018-08-27 DIAGNOSIS — Z20828 Contact with and (suspected) exposure to other viral communicable diseases: Secondary | ICD-10-CM

## 2018-08-27 DIAGNOSIS — F411 Generalized anxiety disorder: Secondary | ICD-10-CM

## 2018-08-27 DIAGNOSIS — G4700 Insomnia, unspecified: Secondary | ICD-10-CM

## 2018-08-27 DIAGNOSIS — J302 Other seasonal allergic rhinitis: Secondary | ICD-10-CM

## 2018-08-27 DIAGNOSIS — M5412 Radiculopathy, cervical region: Secondary | ICD-10-CM

## 2018-08-27 DIAGNOSIS — F32A Depression, unspecified: Secondary | ICD-10-CM

## 2018-08-27 MED ORDER — ZOLPIDEM TARTRATE 10 MG PO TABS: 10.0000 mg | ORAL_TABLET | Freq: Every evening | ORAL | 0 refills | Status: DC | PRN

## 2018-08-27 MED ORDER — ALPRAZOLAM 1 MG PO TABS: 1.0000 mg | ORAL_TABLET | Freq: Two times a day (BID) | ORAL | 0 refills | Status: DC | PRN

## 2018-08-27 NOTE — Progress Notes (Signed)
This service is provided via telemedicine  No vital signs collected/recorded due to the encounter was a telemedicine visit.   Location of patient (ex: home, work): Home    Patient consents to a telephone visit:Yes    Location of the provider (ex: office, home):Office   Name of any referring provider:Lyell Clugston,Floyd   Names of all persons participating in the telemedicine service and their role in the encounter: Victoria Floyd,[facility] CMA,Victoria Floyd(patient)  Time spent on call: 6 MIN  Patient would like COVID 19- results faxed to her employer and also need refill on xanax, and Ambien,  Virtual Visit via Telephone Note  I connected with Victoria Floyd on 08/27/18 at  1:00 PM EDT by telephone and verified that I am speaking with the correct person using two identifiers.   I discussed the limitations, risks, security and privacy concerns of performing an evaluation and management service by telephone and the availability of in person appointments. I also discussed with the patient that there may be a patient responsible charge related to this service. The patient expressed understanding and agreed to proceed.     Careteam: Patient Care Team: Victoria, [name], Floyd as PCP - General (Family Medicine) Pyrtle, Victoria Caddy, MD as Consulting Physician (Gastroenterology)  Advanced Directive information Does Patient Have a Medical Advance Directive?: No, Would patient like information on creating a medical advance directive?: No - Guardian declined  Allergies  Allergen Reactions  . Codeine Itching    Chief Complaint  Patient presents with  . Medical Management of Chronic Issues    follow up on medication, patient tested negative for COVID 9 and would like results faxed to her employer      HPI: Patient is a 53 y.o. female for routine follow up.   She was in the area where there was some positive COVID-19 but no known direct contact. Needs results (negative) sent to  employer. Reports she has a dry cough from her illness. Had fever, cough, fatigue for 3 days. Last fever on 08/05/18. Went to work on 3/16 but was sent home due to having cough and wanted her to be tested for COVID. Feels completely fine now.   Allergies - has chronic nasal congestion. Overall not bad this time of year. uses Flonase and saline spray  with relief when needed  Hypertension-  takes blood pressure at home 122/82. on metoprolol 100 mg tablet twice daily. she denies any dizziness,faintness,shortness of breath or chest pain.  Insomnia - Ambien 10 mg tablet which has been benefitical. Needs refill today.    Generalized Anxiety- controlled on alprazolam 1 mg tablet twice daily as needed- also needs refill   Depression -controlled on Bupropion 300 mg tablet in the morning and 150 mg tablet daily in the evening.   grieving the lost of her daughter.  At first was very hard but better now. Saw therapist a few times but not now. No SI/HI  Neuropathy- controlled on gabapentin and methocarbamol. Routinely takes gabapentin 600 mg and methocarbamol in the morning  Hyperlipidemia-  Has lost 11 lbs since January. Has modified  Review of Systems:  Review of Systems  Constitutional: Negative for chills, fever and weight loss.  HENT: Negative for tinnitus.   Respiratory: Negative for cough, sputum production and shortness of breath.   Cardiovascular: Negative for chest pain, palpitations and leg swelling.  Gastrointestinal: Negative for abdominal pain, constipation, diarrhea and heartburn.  Genitourinary: Negative for dysuria, frequency and urgency.  Musculoskeletal: Negative for  back pain, falls, joint pain and myalgias.  Skin: Negative.   Neurological: Negative for dizziness and headaches.  Psychiatric/Behavioral: Negative for depression and memory loss. The patient does not have insomnia.     Past Medical History:  Diagnosis Date  . Allergy   . Anxiety   . Chronic constipation   .  Hypertension    hx of  . Insomnia    Past Surgical History:  Procedure Laterality Date  . ANTERIOR CERVICAL DECOMP/DISCECTOMY FUSION N/A 07/12/2014   Procedure: ANTERIOR CERVICAL DECOMPRESSION/DISCECTOMY FUSION 2 LEVELS;  Surgeon: Victoria Hero, MD;  Location: [facility] OR;  Service: Orthopedics;  Laterality: N/A;  Anterior cervical decompression fusion, cervical 5-6, cervical 6-7 with instrumentation, allograft  . BUNIONECTOMY Bilateral   . CERVICAL DISC SURGERY  07/12/2014   C5 C6 C7    DR [name]  . TUBAL LIGATION    . WISDOM TOOTH EXTRACTION     Social History:   reports that she quit smoking about 10 years ago. Her smoking use included cigarettes. She quit after 15.00 years of use. She has never used smokeless tobacco. She reports current alcohol use. She reports that she does not use drugs.  Family History  Problem Relation Age of Onset  . Colon polyps Mother   . Colon cancer Father 16       passed away age 43   . Esophageal cancer Neg Hx   . Rectal cancer Neg Hx   . Stomach cancer Neg Hx   . Breast cancer Neg Hx     Medications: Patient's Medications  New Prescriptions   No medications on file  Previous Medications   ALPRAZOLAM (XANAX) 1 MG TABLET    Take 1 tablet (1 mg total) by mouth 2 (two) times daily as needed. for anxiety   BUPROPION (WELLBUTRIN XL) 150 MG 24 HR TABLET    Take one tablet by mouth once daily in the evening   BUPROPION (WELLBUTRIN XL) 300 MG 24 HR TABLET    TAKE 1 TABLET BY MOUTH DAILY IN THE MORNING   FLUTICASONE (FLONASE) 50 MCG/ACT NASAL SPRAY    PLACE 2 SPRAYS INTO BOTH NOSTRILS DAILY AS NEEDED FOR ALLERGIES OR RHINITIS   GABAPENTIN (NEURONTIN) 300 MG CAPSULE    Take 2 capsules (600 mg total) by mouth 2 (two) times daily.   METHOCARBAMOL (ROBAXIN) 500 MG TABLET    Take 2 tablets (1,000 mg total) by mouth 2 (two) times daily. For muscle spasms   METOPROLOL TARTRATE (LOPRESSOR) 100 MG TABLET    Take 2 tablets (200 mg total) by mouth daily.    MULTIPLE VITAMINS-MINERALS (MULTIVITAMIN WITH MINERALS) TABLET    Take 1 tablet by mouth daily.   ZOLPIDEM (AMBIEN) 10 MG TABLET    Take 1 tablet (10 mg total) by mouth at bedtime as needed. for sleep  Modified Medications   No medications on file  Discontinued Medications   No medications on file     Physical Exam:  Unable due to tele-visit.  Labs reviewed: Basic Metabolic Panel: Recent Labs    01/01/18 1233  NA 140  K 4.5  CL 108  CO2 23  GLUCOSE 99  BUN 17  CREATININE 1.04  CALCIUM 9.6  TSH 2.33   Liver Function Tests: Recent Labs    01/01/18 1233  AST 20  ALT 15  BILITOT 0.4  PROT 7.4   No results for input(s): LIPASE, AMYLASE in the last 8760 hours. No results for input(s): AMMONIA in the last 8760  hours. CBC: Recent Labs    01/01/18 1233  WBC 5.2  NEUTROABS 2,980  HGB 13.7  HCT 40.5  MCV 95.5  PLT 275   Lipid Panel: Recent Labs    01/01/18 1233  CHOL 251*  HDL 68  LDLCALC 164*  TRIG 87  CHOLHDL 3.7   TSH: Recent Labs    01/01/18 1233  TSH 2.33   A1C: No results found for: HGBA1C   Assessment/Plan 1. GAD (generalized anxiety disorder) Controlled on current regimen, refill provided - ALPRAZolam (XANAX) 1 MG tablet; Take 1 tablet (1 mg total) by mouth 2 (two) times daily as needed. for anxiety  Dispense: 60 tablet; Refill: 0  2. Insomnia, unspecified type -stable on ambien.  - zolpidem (AMBIEN) 10 MG tablet; Take 1 tablet (10 mg total) by mouth at bedtime as needed. for sleep  Dispense: 30 tablet; Refill: 0  3. Mixed hyperlipidemia LDL not at goal. On a modified ketogenic diet (limites egg, red meat and cheese). Plans to make appt to follow up labs.   4. Cervical radiculopathy Controlled with gabapentin and methocarbamol   5. Essential hypertension Controlled on current regimen. Continues with diet modifications.  6. Seasonal allergies Stable, currently without symptoms at this time  7. SARS-associated coronavirus  exposure She had visited her son in area that there was COVID confirmed cases. Her COVID-19 test was negative and her symptoms of cough, fever, fatigue has resolved.   8. Depression -worse after loss of child but feels like she is reliving appropriately, no longer seeing therapist, will continue current regimen.   Next appt: 4 months for routine follow up, to schedule fasting lab.  Janene Harvey. Biagio Borg  [facility] [phone]   Follow Up Instructions:    I discussed the assessment and treatment plan with the patient. The patient was provided an opportunity to ask questions and all were answered. The patient agreed with the plan and demonstrated an understanding of the instructions.   The patient was advised to call back or seek an in-person evaluation if the symptoms worsen or if the condition fails to improve as anticipated.  I provided 21 minutes of non-face-to-face time during this encounter.

## 2018-08-30 ENCOUNTER — Other Ambulatory Visit: Payer: Self-pay | Admitting: *Deleted

## 2018-08-30 DIAGNOSIS — F411 Generalized anxiety disorder: Secondary | ICD-10-CM

## 2018-08-30 DIAGNOSIS — G4700 Insomnia, unspecified: Secondary | ICD-10-CM

## 2018-08-30 MED ORDER — ALPRAZOLAM 1 MG PO TABS: 1.0000 mg | ORAL_TABLET | Freq: Two times a day (BID) | ORAL | 0 refills | Status: DC | PRN

## 2018-08-30 MED ORDER — ZOLPIDEM TARTRATE 10 MG PO TABS: 10.0000 mg | ORAL_TABLET | Freq: Every evening | ORAL | 0 refills | Status: DC | PRN

## 2018-08-30 NOTE — Telephone Encounter (Signed)
Patient requested refills on Friday but Pharmacy did not receive. Reviewed OV note. Phoned Rx's to pharmacy and spoke with Vernona Rieger at [facility]. Patient aware.   [place] Verified LR: 07/28/2018 Phoned to pharmacy.

## 2018-09-08 ENCOUNTER — Other Ambulatory Visit: Payer: Self-pay | Admitting: Nurse Practitioner

## 2018-09-10 ENCOUNTER — Other Ambulatory Visit: Payer: 59

## 2018-09-21 ENCOUNTER — Other Ambulatory Visit: Payer: Self-pay | Admitting: *Deleted

## 2018-09-21 MED ORDER — GABAPENTIN 300 MG PO CAPS: 600.0000 mg | ORAL_CAPSULE | Freq: Two times a day (BID) | ORAL | 3 refills | Status: DC

## 2018-09-21 NOTE — Telephone Encounter (Signed)
[facility] 

## 2018-09-24 ENCOUNTER — Other Ambulatory Visit: Payer: 59

## 2018-09-28 ENCOUNTER — Other Ambulatory Visit: Payer: Self-pay | Admitting: Nurse Practitioner

## 2018-09-28 DIAGNOSIS — F411 Generalized anxiety disorder: Secondary | ICD-10-CM

## 2018-09-28 DIAGNOSIS — G4700 Insomnia, unspecified: Secondary | ICD-10-CM

## 2018-09-28 NOTE — Telephone Encounter (Signed)
Patient requesting refills for alprazolam 1 mg and zolpidem 10 mg tab.  database verified last refill for zolpidem 10 was 08/30/2018 for 30 tabs and alprazolam 1 mg tab was 08/30/2018 for 60 tabs. Next appointment 12/31/2018 and last appointment was 08/27/2018.

## 2018-10-29 ENCOUNTER — Other Ambulatory Visit: Payer: Self-pay | Admitting: *Deleted

## 2018-10-29 MED ORDER — GABAPENTIN 300 MG PO CAPS: 600.0000 mg | ORAL_CAPSULE | Freq: Two times a day (BID) | ORAL | 3 refills | Status: DC

## 2018-10-29 NOTE — Telephone Encounter (Signed)
[facility] 

## 2018-11-09 ENCOUNTER — Other Ambulatory Visit: Payer: Self-pay | Admitting: Family

## 2018-11-09 DIAGNOSIS — G4700 Insomnia, unspecified: Secondary | ICD-10-CM

## 2018-11-09 DIAGNOSIS — F411 Generalized anxiety disorder: Secondary | ICD-10-CM

## 2018-11-09 DIAGNOSIS — M5412 Radiculopathy, cervical region: Secondary | ICD-10-CM

## 2018-11-09 DIAGNOSIS — G5601 Carpal tunnel syndrome, right upper limb: Secondary | ICD-10-CM

## 2018-11-09 MED ORDER — METHOCARBAMOL 500 MG PO TABS: ORAL_TABLET | ORAL | 1 refills | Status: DC

## 2018-11-09 MED ORDER — BUPROPION HCL ER (XL) 150 MG PO TB24: ORAL_TABLET | ORAL | 3 refills | Status: DC

## 2018-11-09 MED ORDER — BUPROPION HCL ER (XL) 300 MG PO TB24: ORAL_TABLET | ORAL | 3 refills | Status: DC

## 2018-11-09 NOTE — Telephone Encounter (Signed)
Patient requesting refills for Alprazolam 1 mg tab and Zolpidem 10 mg tab last filled according to [place] data base 09/28/2018. Last appointment was with Dinah on 09/28/2018 and next appointment is with [place] on 01/14/2019. Pharmacy has been verified.

## 2018-11-09 NOTE — Telephone Encounter (Signed)
Patient requested refills.

## 2018-12-07 ENCOUNTER — Other Ambulatory Visit: Payer: Self-pay | Admitting: Family

## 2018-12-07 DIAGNOSIS — F411 Generalized anxiety disorder: Secondary | ICD-10-CM

## 2018-12-07 DIAGNOSIS — G4700 Insomnia, unspecified: Secondary | ICD-10-CM

## 2018-12-07 NOTE — Telephone Encounter (Signed)
Both last filled 11/09/2018 in [place], RX requested forwarded to [place], Nelda Bucks, NP to check database and approval refills if necessary

## 2018-12-31 ENCOUNTER — Ambulatory Visit: Payer: 59 | Admitting: Family

## 2019-01-05 ENCOUNTER — Other Ambulatory Visit: Payer: Self-pay | Admitting: Family

## 2019-01-05 DIAGNOSIS — G4700 Insomnia, unspecified: Secondary | ICD-10-CM

## 2019-01-05 DIAGNOSIS — F411 Generalized anxiety disorder: Secondary | ICD-10-CM

## 2019-01-05 NOTE — Telephone Encounter (Signed)
Verified in [facility] data base last refill 12/07/2018 for 60 tab for Alprazolam 1 mg tab and 30 tabs for zolpidem 10 mg. Verified patient's correct pharmacy. Next appointment with Dinah 01/14/2019 and last appointment was 08/27/2018.

## 2019-01-14 ENCOUNTER — Ambulatory Visit: Payer: 59 | Admitting: Family

## 2019-01-28 ENCOUNTER — Other Ambulatory Visit: Payer: Self-pay

## 2019-01-28 ENCOUNTER — Ambulatory Visit (INDEPENDENT_AMBULATORY_CARE_PROVIDER_SITE_OTHER): Payer: 59 | Admitting: Family

## 2019-01-28 ENCOUNTER — Encounter: Payer: Self-pay | Admitting: Family

## 2019-01-28 VITALS — BP 110/78 | HR 57 | Temp 97.8°F | Resp 18 | Ht <= 58 in | Wt 145.2 lb

## 2019-01-28 DIAGNOSIS — I10 Essential (primary) hypertension: Secondary | ICD-10-CM | POA: Diagnosis not present

## 2019-01-28 DIAGNOSIS — E782 Mixed hyperlipidemia: Secondary | ICD-10-CM | POA: Diagnosis not present

## 2019-01-28 DIAGNOSIS — F411 Generalized anxiety disorder: Secondary | ICD-10-CM

## 2019-01-28 DIAGNOSIS — J302 Other seasonal allergic rhinitis: Secondary | ICD-10-CM

## 2019-01-28 DIAGNOSIS — M542 Cervicalgia: Secondary | ICD-10-CM

## 2019-01-28 DIAGNOSIS — G4700 Insomnia, unspecified: Secondary | ICD-10-CM

## 2019-01-28 DIAGNOSIS — G8929 Other chronic pain: Secondary | ICD-10-CM

## 2019-01-28 DIAGNOSIS — F329 Major depressive disorder, single episode, unspecified: Secondary | ICD-10-CM

## 2019-01-28 DIAGNOSIS — F32A Depression, unspecified: Secondary | ICD-10-CM

## 2019-01-28 NOTE — Patient Instructions (Signed)
Check blood pressure and Heart Rate once daily and record Notify [place] office if HR < 60 b/min.

## 2019-01-28 NOTE — Progress Notes (Signed)
Provider: Richarda Bladeinah Jenefer Woerner FNP-C   Victoria Floyd, [name], NP  Patient Care Team: [name], [name], NP as PCP - General (Family Medicine) Pyrtle, Carie CaddyJay M, MD as Consulting Physician (Gastroenterology)  Extended Emergency Contact Information Primary Emergency Contact: Worrell,[name] Address: [address]          [facility] [place], [place] [address] [place] Home Phone: [phone] Relation: Mother  Code Status:Full Code  Goals of care: Advanced Directive information Advanced Directives 08/27/2018  Does Patient Have a Medical Advance Directive? No  Would patient like information on creating a medical advance directive? No - Guardian declined     Chief Complaint  Patient presents with  . Medical Management of Chronic Issues    4 month follow up     HPI:  Pt is a 53 y.o. female seen today for medical management of chronic diseases.she denies any acute issues.she states was tested for COVID-19  In march,2020 which was negative.she wears a mask when out of the house and maintains social distancing.  Hypertension - does check her blood pressure at home but no log for evaluation.HR 57 this visit states took metoprolol 100 mg tablet prior to visit.she denies any dizziness,lightheadedness,faint,chest pain or shortness of breath.   Hyperlipidemia - latest LDL 164,Chol 251 (12/2017) controlling with her diet.will need labs rechecked today.  Depression/anxiety -states symptoms stable on Wellbutrin 300 mg tablet in the morning and 150 mg tablet in the evening and Xanax 1 mg tablet twice daily.she still struggles with the death of her daughter.  Insomnia - Ambien 10 mg tablet daily at bedtime.Reports nightmares though states more from the death of her daughter than side effects from Ambien.  Neck pain - chronic s/p anterior cervical decompression/discectomy fusion in 07/12/2014. Robaxin 500 mg tablet twice daily and Gabapentin 600 mg tablet daily in the morning and 300 mg capsule as needed  effective.   Seasonal allergies - symptoms control on Flonase 50 mcg nasal spray daily as needed.she denies any fever,chills or nasal drainage.    Past Medical History:  Diagnosis Date  . Allergy   . Anxiety   . Chronic constipation   . Hypertension    hx of  . Insomnia    Past Surgical History:  Procedure Laterality Date  . ANTERIOR CERVICAL DECOMP/DISCECTOMY FUSION N/A 07/12/2014   Procedure: ANTERIOR CERVICAL DECOMPRESSION/DISCECTOMY FUSION 2 LEVELS;  Surgeon: Emilee HeroMark Leonard [name], MD;  [facility];  Service: Orthopedics;  Laterality: N/A;  Anterior cervical decompression fusion, cervical 5-6, cervical 6-7 with instrumentation, allograft  . BUNIONECTOMY Bilateral   . CERVICAL DISC SURGERY  07/12/2014   C5 C6 C7    DR [name]  . TUBAL LIGATION    . WISDOM TOOTH EXTRACTION      Allergies  Allergen Reactions  . Codeine Itching    Allergies as of 01/28/2019      Reactions   Codeine Itching      Medication List       Accurate as of January 28, 2019  9:49 AM. If you have any questions, ask your nurse or doctor.        ALPRAZolam 1 MG tablet Commonly known as: XANAX TAKE 1 TABLET BY MOUTH TWICE DAILY AS NEEDED   buPROPion 300 MG 24 hr tablet Commonly known as: WELLBUTRIN XL TAKE 1 TABLET BY MOUTH DAILY IN THE MORNING   buPROPion 150 MG 24 hr tablet Commonly known as: WELLBUTRIN XL TAKE 1 TABLET BY MOUTH DAILY IN THE EVEVNING   fluticasone 50  MCG/ACT nasal spray Commonly known as: FLONASE PLACE 2 SPRAYS INTO BOTH NOSTRILS DAILY AS NEEDED FOR ALLERGIES OR RHINITIS   gabapentin 600 MG tablet Commonly known as: NEURONTIN Take 600 mg by mouth every morning. What changed: Another medication with the same name was removed. Continue taking this medication, and follow the directions you see here. Changed by: Sandrea Hughs, NP   gabapentin 300 MG capsule Commonly known as: NEURONTIN Take 300 mg by mouth as needed. What changed: Another medication with the  same name was removed. Continue taking this medication, and follow the directions you see here. Changed by: Sandrea Hughs, NP   methocarbamol 500 MG tablet Commonly known as: ROBAXIN TAKE 2 TABLETS BY MOUTH TWICE DAILY FOR MUSCLE SPASMS   metoprolol tartrate 100 MG tablet Commonly known as: LOPRESSOR Take 2 tablets (200 mg total) by mouth daily.   multivitamin with minerals tablet Take 1 tablet by mouth daily.   zolpidem 10 MG tablet Commonly known as: AMBIEN TAKE 1 TABLET BY MOUTH AT BEDTIME       Review of Systems  Constitutional: Negative for appetite change, chills, fatigue and fever.  HENT: Positive for sinus pressure and tinnitus. Negative for congestion, ear discharge, ear pain, rhinorrhea, sinus pain, sneezing and sore throat.        States has upcoming appointment for dental routine check up   Eyes: Negative for discharge, redness, itching and visual disturbance.  Respiratory: Negative for cough, chest tightness, shortness of breath and wheezing.   Cardiovascular: Negative for chest pain, palpitations and leg swelling.  Gastrointestinal: Negative for abdominal distention, abdominal pain, blood in stool, constipation, diarrhea, nausea, rectal pain and vomiting.       Abdominal cramping once a month due to anxiety following her daughter's death.   Endocrine: Negative for cold intolerance, heat intolerance, polydipsia, polyphagia and polyuria.  Genitourinary: Negative for difficulty urinating, dysuria, flank pain, frequency and urgency.  Musculoskeletal: Positive for arthralgias and neck pain. Negative for gait problem and neck stiffness.  Skin: Negative for color change, pallor and rash.  Neurological: Negative for dizziness, weakness, light-headedness and headaches.  Hematological: Does not bruise/bleed easily.  Psychiatric/Behavioral: Positive for sleep disturbance. Negative for agitation, confusion and suicidal ideas. The patient is not nervous/anxious.      Immunization History  Administered Date(s) Administered  . Influenza-Unspecified 02/24/2015  . Tdap 02/24/2015   Pertinent  Health Maintenance Due  Topic Date Due  . PAP SMEAR-Modifier  10/09/1986  . INFLUENZA VACCINE  12/25/2018  . MAMMOGRAM  06/03/2019  . COLONOSCOPY  08/12/2021   Fall Risk  01/01/2018 10/27/2017 09/22/2017 11/21/2016 07/04/2016  Falls in the past year? No No No No No    Vitals:   01/28/19 0932  BP: 110/78  Pulse: (!) 57  Resp: 18  Temp: 97.8 F (36.6 C)  TempSrc: Oral  SpO2: 98%  Weight: 145 lb 3.2 oz (65.9 kg)  Height: 4\' 10"  (1.473 m)   Body mass index is 30.35 kg/m. Physical Exam Constitutional:      General: She is not in acute distress.    Appearance: She is obese. She is not ill-appearing or toxic-appearing.  HENT:     Head: Normocephalic.     Right Ear: Tympanic membrane, ear canal and external ear normal. There is no impacted cerumen.     Left Ear: Tympanic membrane, ear canal and external ear normal. There is no impacted cerumen.     Nose: Nose normal. No congestion or rhinorrhea.  Mouth/Throat:     Mouth: Mucous membranes are moist.     Pharynx: Oropharynx is clear. No oropharyngeal exudate or posterior oropharyngeal erythema.  Eyes:     General: No scleral icterus.       Right eye: No discharge.        Left eye: No discharge.     Conjunctiva/sclera: Conjunctivae normal.     Pupils: Pupils are equal, round, and reactive to light.  Neck:     Musculoskeletal: Normal range of motion. No neck rigidity or muscular tenderness.  Cardiovascular:     Rate and Rhythm: Normal rate and regular rhythm.     Pulses: Normal pulses.     Heart sounds: Normal heart sounds. No murmur. No friction rub. No gallop.   Pulmonary:     Effort: Pulmonary effort is normal. No respiratory distress.     Breath sounds: Normal breath sounds. No wheezing or rales.  Chest:     Chest wall: No tenderness.  Abdominal:     General: Bowel sounds are normal. There is no  distension.     Palpations: Abdomen is soft. There is no mass.     Tenderness: There is no abdominal tenderness. There is no right CVA tenderness, left CVA tenderness, guarding or rebound.  Musculoskeletal: Normal range of motion.        General: No swelling or tenderness.     Right lower leg: No edema.     Left lower leg: No edema.     Comments: Left foot second toe overlapping with great toe.   Lymphadenopathy:     Cervical: No cervical adenopathy.  Skin:    General: Skin is warm and dry.     Coloration: Skin is not pale.     Findings: No bruising, erythema or rash.  Neurological:     Mental Status: She is alert and oriented to person, place, and time. Mental status is at baseline.     Cranial Nerves: No cranial nerve deficit.     Sensory: No sensory deficit.     Motor: No weakness.     Coordination: Coordination normal.     Gait: Gait normal.  Psychiatric:        Mood and Affect: Mood normal.        Behavior: Behavior normal.        Thought Content: Thought content normal.        Judgment: Judgment normal.     Labs reviewed: No results for input(s): NA, K, CL, CO2, GLUCOSE, BUN, CREATININE, CALCIUM, MG, PHOS in the last 8760 hours. No results for input(s): AST, ALT, ALKPHOS, BILITOT, PROT, ALBUMIN in the last 8760 hours. No results for input(s): WBC, NEUTROABS, HGB, HCT, MCV, PLT in the last 8760 hours. Lab Results  Component Value Date   TSH 2.33 01/01/2018   No results found for: HGBA1C Lab Results  Component Value Date   CHOL 251 (H) 01/01/2018   HDL 68 01/01/2018   LDLCALC 164 (H) 01/01/2018   TRIG 87 01/01/2018   CHOLHDL 3.7 01/01/2018    Significant Diagnostic Results in last 30 days:  No results found.  Assessment/Plan 1. Essential hypertension B/p at goal.continue on metoprolol 200 mg tablet daily.B/p log mailed for patient to check blood pressure and HR daily and record.encouraged to notify provider's office  if HR < 60 b/min before taking metoprolol.   2. Mixed hyperlipidemia Continue with dietary modification and lifestyle changes.previous ordered Lipid done today.  3. GAD (generalized anxiety disorder) Stable on  current Xanax 1 mg tablet twice daily as needed.  4. Chronic depression Symptoms stable.continue on Wellbutrin  300 mg tablet in the morning and 150 mg tablet in the evening  5. Insomnia, unspecified type Continue on Ambien 10 mg tablet at bedtime.   6. Seasonal allergies Afebrile.continue on Flonase 50 mcg nasal spray.    7. Neck pain, chronic Pain under control on current regimen.    Family/ staff Communication: Reviewed plan of care with patient  Labs/tests ordered:Previous ordered lab work drawn this visit.patient missed visit in 08/2018.    Caesar Bookmaninah C Mickey Esguerra, NP

## 2019-01-29 LAB — COMPLETE METABOLIC PANEL WITH GFR
AG Ratio: 1.6 (calc) (ref 1.0–2.5)
ALT: 15 U/L (ref 6–29)
AST: 19 U/L (ref 10–35)
Albumin: 4.2 g/dL (ref 3.6–5.1)
Alkaline phosphatase (APISO): 70 U/L (ref 37–153)
BUN/Creatinine Ratio: 15 (calc) (ref 6–22)
BUN: 17 mg/dL (ref 7–25)
CO2: 24 mmol/L (ref 20–32)
Calcium: 9.5 mg/dL (ref 8.6–10.4)
Chloride: 107 mmol/L (ref 98–110)
Creat: 1.11 mg/dL — ABNORMAL HIGH (ref 0.50–1.05)
GFR, Est African American: 66 mL/min/{1.73_m2} (ref >=60)
GFR, Est Non African American: 57 mL/min/{1.73_m2} — ABNORMAL LOW (ref >=60)
Globulin: 2.7 g/dL (calc) (ref 1.9–3.7)
Glucose, Bld: 72 mg/dL (ref 65–99)
Potassium: 4.5 mmol/L (ref 3.5–5.3)
Sodium: 140 mmol/L (ref 135–146)
Total Bilirubin: 0.3 mg/dL (ref 0.2–1.2)
Total Protein: 6.9 g/dL (ref 6.1–8.1)

## 2019-01-29 LAB — CBC WITH DIFFERENTIAL/PLATELET
Absolute Monocytes: 430 cells/uL (ref 200–950)
Basophils Absolute: 22 cells/uL (ref 0–200)
Basophils Relative: 0.5 %
Eosinophils Absolute: 82 cells/uL (ref 15–500)
Eosinophils Relative: 1.9 %
HCT: 37.6 % (ref 35.0–45.0)
Hemoglobin: 12.6 g/dL (ref 11.7–15.5)
Lymphs Abs: 1479 cells/uL (ref 850–3900)
MCH: 32.1 pg (ref 27.0–33.0)
MCHC: 33.5 g/dL (ref 32.0–36.0)
MCV: 95.9 fL (ref 80.0–100.0)
MPV: 9.4 fL (ref 7.5–12.5)
Monocytes Relative: 10 %
Neutro Abs: 2288 cells/uL (ref 1500–7800)
Neutrophils Relative %: 53.2 %
Platelets: 263 10*3/uL (ref 140–400)
RBC: 3.92 10*6/uL (ref 3.80–5.10)
RDW: 12.1 % (ref 11.0–15.0)
Total Lymphocyte: 34.4 %
WBC: 4.3 10*3/uL (ref 3.8–10.8)

## 2019-01-29 LAB — LIPID PANEL
Cholesterol: 260 mg/dL — ABNORMAL HIGH (ref <200)
HDL: 66 mg/dL (ref >=50)
LDL Cholesterol (Calc): 177 mg/dL (calc) — ABNORMAL HIGH
Non-HDL Cholesterol (Calc): 194 mg/dL (calc) — ABNORMAL HIGH (ref <130)
Total CHOL/HDL Ratio: 3.9 (calc) (ref <5.0)
Triglycerides: 68 mg/dL (ref <150)

## 2019-01-29 LAB — TSH: TSH: 1.48 mIU/L

## 2019-02-02 ENCOUNTER — Other Ambulatory Visit: Payer: Self-pay

## 2019-02-02 ENCOUNTER — Other Ambulatory Visit: Payer: Self-pay | Admitting: Family

## 2019-02-02 DIAGNOSIS — F411 Generalized anxiety disorder: Secondary | ICD-10-CM

## 2019-02-02 DIAGNOSIS — E782 Mixed hyperlipidemia: Secondary | ICD-10-CM

## 2019-02-02 DIAGNOSIS — G4700 Insomnia, unspecified: Secondary | ICD-10-CM

## 2019-02-02 MED ORDER — ATORVASTATIN CALCIUM 10 MG PO TABS: 10.0000 mg | ORAL_TABLET | Freq: Every day | ORAL | 0 refills | Status: DC

## 2019-02-07 ENCOUNTER — Other Ambulatory Visit: Payer: Self-pay | Admitting: Family

## 2019-02-07 DIAGNOSIS — G4700 Insomnia, unspecified: Secondary | ICD-10-CM

## 2019-02-07 DIAGNOSIS — F411 Generalized anxiety disorder: Secondary | ICD-10-CM

## 2019-02-07 DIAGNOSIS — I10 Essential (primary) hypertension: Secondary | ICD-10-CM

## 2019-02-08 NOTE — Telephone Encounter (Signed)
Xanax and Zolpidem last filled 01/05/2019 in Epic.[facility] RX request sent to [name], Nelda Bucks, NP for review of [place] database and approval if necessary

## 2019-03-07 ENCOUNTER — Other Ambulatory Visit: Payer: Self-pay | Admitting: Family

## 2019-03-07 DIAGNOSIS — F411 Generalized anxiety disorder: Secondary | ICD-10-CM

## 2019-03-07 NOTE — Telephone Encounter (Signed)
Rx last filled in Epic on 02/08/2019

## 2019-03-08 ENCOUNTER — Other Ambulatory Visit: Payer: Self-pay | Admitting: Family

## 2019-03-08 DIAGNOSIS — G4700 Insomnia, unspecified: Secondary | ICD-10-CM

## 2019-03-09 NOTE — Telephone Encounter (Signed)
Patient request refill for zolpidem 10 mg tablet. Verified in [place] data base last refill was 02/08/2019 for 30 tablets no refills. Last appointment was with provider on 01/28/2019 and patient has no future appointment schedule. Routing to provider for approval.

## 2019-04-07 ENCOUNTER — Other Ambulatory Visit: Payer: Self-pay | Admitting: Family

## 2019-04-07 DIAGNOSIS — F411 Generalized anxiety disorder: Secondary | ICD-10-CM

## 2019-04-07 DIAGNOSIS — G4700 Insomnia, unspecified: Secondary | ICD-10-CM

## 2019-04-07 DIAGNOSIS — M5412 Radiculopathy, cervical region: Secondary | ICD-10-CM

## 2019-04-07 DIAGNOSIS — G5601 Carpal tunnel syndrome, right upper limb: Secondary | ICD-10-CM

## 2019-04-07 NOTE — Telephone Encounter (Signed)
Ambien last filled on 03/09/2019  Xanax last filled on 03/07/2019   RX requests sent to [place], Nelda Bucks, NP to review [place] database and approve if necessary

## 2019-04-15 ENCOUNTER — Telehealth: Payer: Self-pay

## 2019-04-15 NOTE — Telephone Encounter (Signed)
Patient called to schedule an appointment with Dinah for after the holiday appointment made

## 2019-05-05 ENCOUNTER — Other Ambulatory Visit: Payer: Self-pay | Admitting: Family

## 2019-05-05 DIAGNOSIS — G4700 Insomnia, unspecified: Secondary | ICD-10-CM

## 2019-05-05 DIAGNOSIS — F411 Generalized anxiety disorder: Secondary | ICD-10-CM

## 2019-05-05 NOTE — Telephone Encounter (Signed)
Last filled in epic on 04/07/2019  Pending appointment to update contract on 05/11/2019

## 2019-05-06 ENCOUNTER — Other Ambulatory Visit: Payer: 59

## 2019-05-06 ENCOUNTER — Ambulatory Visit: Payer: Self-pay | Admitting: Family

## 2019-05-11 ENCOUNTER — Ambulatory Visit (INDEPENDENT_AMBULATORY_CARE_PROVIDER_SITE_OTHER): Payer: 59 | Admitting: Family

## 2019-05-11 ENCOUNTER — Other Ambulatory Visit: Payer: Self-pay

## 2019-05-11 ENCOUNTER — Encounter: Payer: Self-pay | Admitting: Family

## 2019-05-11 VITALS — BP 128/82 | HR 88 | Temp 96.9°F | Ht <= 58 in | Wt 151.6 lb

## 2019-05-11 DIAGNOSIS — G4700 Insomnia, unspecified: Secondary | ICD-10-CM | POA: Diagnosis not present

## 2019-05-11 DIAGNOSIS — R197 Diarrhea, unspecified: Secondary | ICD-10-CM | POA: Diagnosis not present

## 2019-05-11 DIAGNOSIS — F411 Generalized anxiety disorder: Secondary | ICD-10-CM

## 2019-05-11 NOTE — Progress Notes (Addendum)
Provider: Marlowe Sax FNP-C  Ritamarie Arkin, Nelda Bucks, NP  Patient Care Team: Korvin Valentine, Nelda Bucks, NP as PCP - General (Family Medicine) Pyrtle, Lajuan Lines, MD as Consulting Physician (Gastroenterology)  Extended Emergency Contact Information Primary Emergency Contact: Worrell,[name] Address: [address]          [place], [place] [address] Johnnette Litter of [place] Phone: [phone] Relation: Mother  Code Status:  DNR Goals of care: Advanced Directive information Advanced Directives 08/27/2018  Does Patient Have a Medical Advance Directive? No  Would patient like information on creating a medical advance directive? No - Guardian declined     Chief Complaint  Patient presents with  . Medical Management of Chronic Issues    Update non opioid medication contract, Patient would like forms fill out for work.     HPI:  Pt is a 53 y.o. female seen today for an acute visit for to update her opiod medication contract.she states continues to require alprazolam 1 mg tablet at least twice daily sometimes and at times needs only one tablet.also takes Zolpidem 10 mg tablet at bedtime.states cannot sleep without using Zolpidem.she states tried trazodone in the past and it made " my head feel funny".Her overdose risk and risk for respiratory depression discuss with patient but states has been on the medication for several years without any problems.she would like to continue on current medication. Also request FMLA paperwork to be filled states has diarrhea sometimes in the mornings whenever she is nervous or if she eats fast foods.states diarrhea has been ongoing since 2016 though on chart review patient has not been treated for diarrhea or had a visit for diagnosis of diarrhea.she does have a family history of colon cancer her last colonoscopy was 08/12/2016 results was normal was told to follow up in 5 years for next colonoscopy. She reports no diarrhea,Nausea,vomiting ,abdomina pain or weight loss.     Past Medical History:  Diagnosis Date  . Allergy   . Anxiety   . Chronic constipation   . Hypertension    hx of  . Insomnia    Past Surgical History:  Procedure Laterality Date  . ANTERIOR CERVICAL DECOMP/DISCECTOMY FUSION N/A 07/12/2014   Procedure: ANTERIOR CERVICAL DECOMPRESSION/DISCECTOMY FUSION 2 LEVELS;  Surgeon: Sinclair Ship, MD;  Location: [facility];  Service: Orthopedics;  Laterality: N/A;  Anterior cervical decompression fusion, cervical 5-6, cervical 6-7 with instrumentation, allograft  . BUNIONECTOMY Bilateral   . CERVICAL DISC SURGERY  07/12/2014   C5 C6 C7    DR [name]  . TUBAL LIGATION    . WISDOM TOOTH EXTRACTION      Allergies  Allergen Reactions  . Codeine Itching    Outpatient Encounter Medications as of 05/11/2019  Medication Sig  . ALPRAZolam (XANAX) 1 MG tablet TAKE 1 TABLET BY MOUTH 2 TIMES DAILY AS NEEDED  . atorvastatin (LIPITOR) 10 MG tablet TAKE 1 TABLET BY MOUTH DAILY  . buPROPion (WELLBUTRIN XL) 150 MG 24 hr tablet TAKE 1 TABLET BY MOUTH DAILY IN THE EVEVNING  . buPROPion (WELLBUTRIN XL) 300 MG 24 hr tablet TAKE 1 TABLET BY MOUTH DAILY IN THE MORNING  . fluticasone (FLONASE) 50 MCG/ACT nasal spray PLACE 2 SPRAYS INTO BOTH NOSTRILS DAILY AS NEEDED FOR ALLERGIES OR RHINITIS  . gabapentin (NEURONTIN) 300 MG capsule Take 300 mg by mouth as needed.  . gabapentin (NEURONTIN) 600 MG tablet Take 600 mg by mouth every morning.  . methocarbamol (ROBAXIN) 500 MG tablet TAKE 2 TABLETS BY MOUTH TWICE  DAILY FOR MUSCLE SPAMS  . metoprolol tartrate (LOPRESSOR) 100 MG tablet TAKE 2 TABLET BY MOUTH DAILY  . Multiple Vitamins-Minerals (MULTIVITAMIN WITH MINERALS) tablet Take 1 tablet by mouth daily.  Marland Kitchen zolpidem (AMBIEN) 10 MG tablet TAKE 1 TABLET BY MOUTH AT BEDTIME PLEASE CALL [facility] TO SCHEDULE FUTURE APPOINTMENTS FOR REFILLS PER DR.   No facility-administered encounter medications on file as of 05/11/2019.    Review of Systems  Constitutional: Negative  for appetite change, chills, fatigue and fever.  Respiratory: Negative for cough, chest tightness, shortness of breath and wheezing.   Cardiovascular: Negative for chest pain, palpitations and leg swelling.  Gastrointestinal: Negative for abdominal distention, abdominal pain, blood in stool, constipation, diarrhea, nausea and vomiting.       Reports intermittent diarrhea sometimes 1-2 times per month and sometimes none. No diarrhea this visit.   Endocrine: Negative for cold intolerance, heat intolerance, polydipsia, polyphagia and polyuria.  Genitourinary: Negative for difficulty urinating, dysuria, flank pain, frequency and urgency.  Musculoskeletal: Negative for arthralgias and gait problem.  Skin: Negative for color change, pallor and rash.  Neurological: Negative for dizziness, speech difficulty, weakness, light-headedness, numbness and headaches.  Hematological: Does not bruise/bleed easily.  Psychiatric/Behavioral: Positive for sleep disturbance. Negative for agitation and behavioral problems. The patient is nervous/anxious.     Immunization History  Administered Date(s) Administered  . Influenza-Unspecified 02/24/2015  . Tdap 02/24/2015   Pertinent  Health Maintenance Due  Topic Date Due  . PAP SMEAR-Modifier  10/09/1986  . INFLUENZA VACCINE  12/25/2018  . MAMMOGRAM  06/03/2019  . COLONOSCOPY  08/12/2021   Fall Risk  01/01/2018 10/27/2017 09/22/2017 11/21/2016 07/04/2016  Falls in the past year? No No No No No    Vitals:   05/11/19 1553  BP: 128/82  Pulse: 88  Temp: (!) 96.9 F (36.1 C)  TempSrc: Temporal  SpO2: 92%  Weight: 151 lb 9.6 oz (68.8 kg)  Height: 4\' 10"  (1.473 m)   Body mass index is 31.68 kg/m. Physical Exam Vitals reviewed.  Constitutional:      General: She is not in acute distress.    Appearance: She is obese. She is not ill-appearing.  Eyes:     General: No scleral icterus.       Right eye: No discharge.        Left eye: No discharge.     Extraocular  Movements: Extraocular movements intact.     Conjunctiva/sclera: Conjunctivae normal.     Pupils: Pupils are equal, round, and reactive to light.  Cardiovascular:     Rate and Rhythm: Normal rate and regular rhythm.     Pulses: Normal pulses.     Heart sounds: Normal heart sounds. No murmur. No friction rub. No gallop.   Pulmonary:     Effort: Pulmonary effort is normal. No respiratory distress.     Breath sounds: Normal breath sounds. No wheezing, rhonchi or rales.  Chest:     Chest wall: No tenderness.  Abdominal:     General: Bowel sounds are normal. There is no distension.     Palpations: Abdomen is soft. There is no mass.     Tenderness: There is no abdominal tenderness. There is no right CVA tenderness, left CVA tenderness, guarding or rebound.  Musculoskeletal:        General: No swelling or tenderness. Normal range of motion.     Right lower leg: No edema.     Left lower leg: No edema.  Skin:    General: Skin  is warm and dry.     Coloration: Skin is not pale.     Findings: No erythema.  Neurological:     Mental Status: She is alert and oriented to person, place, and time.     Cranial Nerves: No cranial nerve deficit.     Sensory: No sensory deficit.     Motor: No weakness.     Gait: Gait normal.  Psychiatric:        Mood and Affect: Mood is anxious.        Speech: Speech normal.        Behavior: Behavior normal.        Thought Content: Thought content normal.        Judgment: Judgment normal.     Labs reviewed: Recent Labs    01/28/19 1006  NA 140  K 4.5  CL 107  CO2 24  GLUCOSE 72  BUN 17  CREATININE 1.11*  CALCIUM 9.5   Recent Labs    01/28/19 1006  AST 19  ALT 15  BILITOT 0.3  PROT 6.9   Recent Labs    01/28/19 1006  WBC 4.3  NEUTROABS 2,288  HGB 12.6  HCT 37.6  MCV 95.9  PLT 263   Lab Results  Component Value Date   TSH 1.48 01/28/2019   No results found for: HGBA1C Lab Results  Component Value Date   CHOL 260 (H) 01/28/2019    HDL 66 01/28/2019   LDLCALC 177 (H) 01/28/2019   TRIG 68 01/28/2019   CHOLHDL 3.9 01/28/2019    Significant Diagnostic Results in last 30 days:  No results found.  Assessment/Plan 1. GAD (generalized anxiety disorder) Alprazolam 1 mg tablet twice daily as needed effective.overdose risk discussed with patient but unable to wean off this visit.Opiod use contract signed.   2. Insomnia, unspecified type Cannot sleep without taking Ambien.contract signed for use of Zolpidem.   3. Diarrhea, unspecified type Afebrile.Reports intermittent diarrhea sometimes 1-2 times per month and sometimes none. No diarrhea this visit.Abdominal exam negative.Unable to sign patient's FMLA paperwork since she has no diagnosis in the chart of being treated with diarrhea.she has a diagnosis noted for constipation.[facility] notified.Encouraged patient to notify provider's office if diarrhea recurs. Encouraged to avoid fast foods.    Family/ staff Communication: Reviewed plan of care with patient.  Labs/tests ordered: None  Next appointment: 6 Months for medical management of chronic issues.   Caesar Bookmaninah C Monti Villers, NP

## 2019-06-03 ENCOUNTER — Other Ambulatory Visit: Payer: Self-pay | Admitting: Family

## 2019-06-03 DIAGNOSIS — F411 Generalized anxiety disorder: Secondary | ICD-10-CM

## 2019-06-03 DIAGNOSIS — G4700 Insomnia, unspecified: Secondary | ICD-10-CM

## 2019-06-03 NOTE — Telephone Encounter (Signed)
Patient has upcoming appointment on 11/25/2019 for 6 month Medication Management. Non Opioid medication contract on file. Medication Pended and routed to provider Richarda Blade, NP to sign off.

## 2019-07-01 ENCOUNTER — Other Ambulatory Visit: Payer: Self-pay | Admitting: Family

## 2019-07-01 DIAGNOSIS — F411 Generalized anxiety disorder: Secondary | ICD-10-CM

## 2019-07-01 DIAGNOSIS — G4700 Insomnia, unspecified: Secondary | ICD-10-CM

## 2019-07-26 ENCOUNTER — Other Ambulatory Visit: Payer: Self-pay | Admitting: Family

## 2019-07-26 DIAGNOSIS — G4700 Insomnia, unspecified: Secondary | ICD-10-CM

## 2019-07-26 DIAGNOSIS — F411 Generalized anxiety disorder: Secondary | ICD-10-CM

## 2019-09-01 ENCOUNTER — Other Ambulatory Visit: Payer: Self-pay | Admitting: Family

## 2019-09-01 DIAGNOSIS — F411 Generalized anxiety disorder: Secondary | ICD-10-CM

## 2019-09-01 DIAGNOSIS — I10 Essential (primary) hypertension: Secondary | ICD-10-CM

## 2019-09-01 DIAGNOSIS — G4700 Insomnia, unspecified: Secondary | ICD-10-CM

## 2019-09-01 NOTE — Telephone Encounter (Signed)
Non-opioid treatment agreement on file from: 05/11/2019  Both RX's (Ambien & Alprazolam)  last filled on 07/26/2019 in [facility]

## 2019-09-28 ENCOUNTER — Other Ambulatory Visit: Payer: Self-pay | Admitting: Family

## 2019-09-28 DIAGNOSIS — F411 Generalized anxiety disorder: Secondary | ICD-10-CM

## 2019-09-28 DIAGNOSIS — G4700 Insomnia, unspecified: Secondary | ICD-10-CM

## 2019-09-28 NOTE — Telephone Encounter (Signed)
This is not due to refill until the 09/28/2019 a few days to early

## 2019-09-28 NOTE — Telephone Encounter (Signed)
Non-opioid treatment agreement on file from: 05/10/2020 for Ambien and Alprazolam  RX's  last filled on 4/8//2021  Gabapentin last filled on 07/26/2019

## 2019-10-27 ENCOUNTER — Ambulatory Visit (INDEPENDENT_AMBULATORY_CARE_PROVIDER_SITE_OTHER): Payer: Managed Care, Other (non HMO) | Admitting: Family

## 2019-10-27 ENCOUNTER — Encounter: Payer: Self-pay | Admitting: Family

## 2019-10-27 ENCOUNTER — Other Ambulatory Visit: Payer: Self-pay

## 2019-10-27 VITALS — BP 120/80 | HR 60 | Temp 97.7°F | Resp 16 | Ht <= 58 in | Wt 143.8 lb

## 2019-10-27 DIAGNOSIS — R399 Unspecified symptoms and signs involving the genitourinary system: Secondary | ICD-10-CM

## 2019-10-27 DIAGNOSIS — E782 Mixed hyperlipidemia: Secondary | ICD-10-CM

## 2019-10-27 DIAGNOSIS — I10 Essential (primary) hypertension: Secondary | ICD-10-CM | POA: Diagnosis not present

## 2019-10-27 LAB — POCT URINALYSIS DIPSTICK
Bilirubin, UA: NEGATIVE
Blood, UA: NEGATIVE
Glucose, UA: NEGATIVE
Ketones, UA: NEGATIVE
Leukocytes, UA: NEGATIVE
Nitrite, UA: NEGATIVE
Protein, UA: NEGATIVE
Spec Grav, UA: 1.01 (ref 1.010–1.025)
Urobilinogen, UA: NEGATIVE E.U./dL — AB
pH, UA: 5 (ref 5.0–8.0)

## 2019-10-27 MED ORDER — CIPROFLOXACIN HCL 500 MG PO TABS: 500.0000 mg | ORAL_TABLET | Freq: Two times a day (BID) | ORAL | 0 refills | Status: AC

## 2019-10-27 MED ORDER — SACCHAROMYCES BOULARDII 250 MG PO CAPS: 250.0000 mg | ORAL_CAPSULE | Freq: Two times a day (BID) | ORAL | 0 refills | Status: AC

## 2019-10-27 NOTE — Progress Notes (Signed)
Provider: Marlowe Sax FNP-C  Baraka Klatt, Nelda Bucks, NP  Patient Care Team: Ashaun Gaughan, Nelda Bucks, NP as PCP - General (Family Medicine) Pyrtle, Lajuan Lines, MD as Consulting Physician (Gastroenterology)  Extended Emergency Contact Information Primary Emergency Contact: Worrell,[name] Address: [address]          [place], [place] [address] Johnnette Litter of [place] Phone: [phone] Relation: Mother  Code Status: Full code  Goals of care: Advanced Directive information Advanced Directives 10/27/2019  Does Patient Have a Medical Advance Directive? No  Would patient like information on creating a medical advance directive? No - Patient declined     Chief Complaint  Patient presents with  . Acute Visit    Possible Bladder Infection.    HPI:  Pt is a 54 y.o. female seen today for an acute visit for evaluation of urine frequency,urgecny and lower abdominal pain.has taken AZO tablets which helped with pain.Denies any fever,chills,nausea,vomiting or back pain.she drinks plenty of fluids. She states has modified her diet " cut out junk foods" and exercising more. Has had 9 lbs weight loss 6 months.Congratulated for the dietary and exercises.Encouraged to continue with her dietary modification and exercises.    Past Medical History:  Diagnosis Date  . Allergy   . Anxiety   . Chronic constipation   . Hypertension    hx of  . Insomnia    Past Surgical History:  Procedure Laterality Date  . ANTERIOR CERVICAL DECOMP/DISCECTOMY FUSION N/A 07/12/2014   Procedure: ANTERIOR CERVICAL DECOMPRESSION/DISCECTOMY FUSION 2 LEVELS;  Surgeon: Sinclair Ship, MD;  Location: [facility];  Service: Orthopedics;  Laterality: N/A;  Anterior cervical decompression fusion, cervical 5-6, cervical 6-7 with instrumentation, allograft  . BUNIONECTOMY Bilateral   . CERVICAL DISC SURGERY  07/12/2014   C5 C6 C7    DR [name]  . TUBAL LIGATION    . WISDOM TOOTH EXTRACTION      Allergies  Allergen Reactions    . Codeine Itching    Outpatient Encounter Medications as of 10/27/2019  Medication Sig  . ALPRAZolam (XANAX) 1 MG tablet TAKE 1 TABLET BY MOUTH 2 TIMES DAILY AS NEEDED  . atorvastatin (LIPITOR) 10 MG tablet TAKE 1 TABLET BY MOUTH DAILY  . buPROPion (WELLBUTRIN XL) 150 MG 24 hr tablet Take 1 tablet (150 mg total) by mouth daily.  Marland Kitchen buPROPion (WELLBUTRIN XL) 300 MG 24 hr tablet Take 300 mg by mouth daily.  . fluticasone (FLONASE) 50 MCG/ACT nasal spray PLACE 2 SPRAYS INTO BOTH NOSTRILS DAILY AS NEEDED FOR ALLERGIES OR RHINITIS  . gabapentin (NEURONTIN) 300 MG capsule TAKE 2 CAPSULES BY MOUTH TWICE DAILY  . methocarbamol (ROBAXIN) 500 MG tablet TAKE 2 TABLETS BY MOUTH TWICE DAILY FOR MUSCLE SPAMS  . metoprolol tartrate (LOPRESSOR) 100 MG tablet TAKE 2 TABLETS BY MOUTH DAILY  . Multiple Vitamins-Minerals (MULTIVITAMIN WITH MINERALS) tablet Take 1 tablet by mouth daily.  Marland Kitchen zolpidem (AMBIEN) 10 MG tablet TAKE 1 TABLET BY MOUTH AT BEDTIME PLEASE CALL [facility] TO SCHEDULE FUTURE APPOINTMENTS FOR REFILLS PER DR.  . [DISCONTINUED] buPROPion (WELLBUTRIN XL) 300 MG 24 hr tablet TAKE 1 TABLET BY MOUTH DAILY IN THE MORNING  . [DISCONTINUED] gabapentin (NEURONTIN) 600 MG tablet Take 600 mg by mouth every morning.   No facility-administered encounter medications on file as of 10/27/2019.    Review of Systems  Constitutional: Negative for activity change, chills, fatigue and fever.  Respiratory: Negative for cough, chest tightness, shortness of breath and wheezing.   Cardiovascular: Negative for chest  pain, palpitations and leg swelling.  Gastrointestinal: Positive for abdominal pain. Negative for abdominal distention, diarrhea, nausea and vomiting.  Genitourinary: Positive for dysuria, frequency and urgency. Negative for decreased urine volume, difficulty urinating, flank pain and hematuria.  Musculoskeletal: Negative for back pain and gait problem.  Neurological: Negative for dizziness, light-headedness and  headaches.    Immunization History  Administered Date(s) Administered  . Influenza-Unspecified 02/24/2015  . Tdap 02/24/2015   Pertinent  Health Maintenance Due  Topic Date Due  . PAP SMEAR-Modifier  Never done  . MAMMOGRAM  06/03/2019  . INFLUENZA VACCINE  12/25/2019  . COLONOSCOPY  08/12/2021   Fall Risk  10/27/2019 01/01/2018 10/27/2017 09/22/2017 11/21/2016  Falls in the past year? 0 No No No No  Number falls in past yr: 0 - - - -  Injury with Fall? 0 - - - -    Vitals:   10/27/19 1549  BP: 120/80  Pulse: 60  Resp: 16  Temp: 97.7 F (36.5 C)  SpO2: 96%  Weight: 143 lb 12.8 oz (65.2 kg)  Height: '4\' 10"'$  (1.473 m)   Body mass index is 30.05 kg/m. Physical Exam Vitals reviewed.  Constitutional:      General: She is not in acute distress.    Appearance: She is obese. She is not ill-appearing.  Eyes:     General: No scleral icterus.       Right eye: No discharge.        Left eye: No discharge.     Conjunctiva/sclera: Conjunctivae normal.     Pupils: Pupils are equal, round, and reactive to light.  Cardiovascular:     Rate and Rhythm: Normal rate and regular rhythm.     Pulses: Normal pulses.     Heart sounds: Normal heart sounds. No murmur. No friction rub. No gallop.   Pulmonary:     Effort: Pulmonary effort is normal. No respiratory distress.     Breath sounds: Normal breath sounds. No wheezing, rhonchi or rales.  Chest:     Chest wall: No tenderness.  Abdominal:     General: Bowel sounds are normal. There is no distension.     Palpations: Abdomen is soft. There is no mass.     Tenderness: There is abdominal tenderness in the suprapubic area. There is no right CVA tenderness, left CVA tenderness, guarding or rebound.  Musculoskeletal:        General: No swelling or tenderness. Normal range of motion.     Right lower leg: No edema.     Left lower leg: No edema.  Skin:    General: Skin is warm.     Coloration: Skin is not pale.     Findings: No bruising,  erythema or rash.  Neurological:     Mental Status: She is alert and oriented to person, place, and time.     Motor: No weakness.     Gait: Gait normal.  Psychiatric:        Mood and Affect: Mood normal.        Behavior: Behavior normal.        Thought Content: Thought content normal.        Judgment: Judgment normal.     Labs reviewed: Recent Labs    01/28/19 1006  NA 140  K 4.5  CL 107  CO2 24  GLUCOSE 72  BUN 17  CREATININE 1.11*  CALCIUM 9.5   Recent Labs    01/28/19 1006  AST 19  ALT 15  BILITOT 0.3  PROT 6.9   Recent Labs    01/28/19 1006  WBC 4.3  NEUTROABS 2,288  HGB 12.6  HCT 37.6  MCV 95.9  PLT 263   Lab Results  Component Value Date   TSH 1.48 01/28/2019   No results found for: HGBA1C Lab Results  Component Value Date   CHOL 260 (H) 01/28/2019   HDL 66 01/28/2019   LDLCALC 177 (H) 01/28/2019   TRIG 68 01/28/2019   CHOLHDL 3.9 01/28/2019    Significant Diagnostic Results in last 30 days:  No results found.  Assessment/Plan  Symptoms of urinary tract infection Afebrile.Symptomatic has taken AZO tablet urine orange in color.  - POC Urinalysis Dipstick orange color,negative for blood,nitrite and Leukocytes.Discussed with patient to send urine off for cultures but request antibiotics.Will treat clinically made aware that will need to change antibiotics if culture and sensitivity indicates another antibiotic.she verbalized understanding.encouraged to take a probiotics while on antibiotics to prevent ABX associated diarrhea.    - Culture, Urine - ciprofloxacin (CIPRO) 500 MG tablet; Take 1 tablet (500 mg total) by mouth 2 (two) times daily for 7 days.  Dispense: 14 tablet; Refill: 0 - saccharomyces boulardii (FLORASTOR) 250 MG capsule; Take 1 capsule (250 mg total) by mouth 2 (two) times daily for 10 days.  Dispense: 20 capsule; Refill: 0  2. Essential hypertension B/p at goal.due for lab work - CBC with Differential/Platelet; Future - CMP  with eGFR([facility]); Future - TSH; Future  3. Mixed hyperlipidemia Continue to modify diet and exercise.  - Lipid panel; Future  Family/ staff Communication: Reviewed plan of care with patient verbalized understanding.   Labs/tests ordered:  - CBC with Differential/Platelet; Future - CMP with eGFR([facility]); Future - TSH; Future - Lipid panel; Future   Next Appointment: 3 weeks for fasting blood work.  Sandrea Hughs, NP

## 2019-10-27 NOTE — Patient Instructions (Signed)
Acute Urinary Retention, Female  Acute urinary retention means that you cannot pee (urinate) at all, or that you pee too little and your bladder is not emptied completely. If it is not treated, it can lead to kidney damage or other serious problems. Follow these instructions at home:  Take over-the-counter and prescription medicines only as told by your doctor. Ask your doctor what medicines you should stay away from. Do not take any medicine unless your doctor says it is okay to do so.  If you were sent home with a tube that drains pee from the bladder (catheter), take care of it as told by your doctor.  Drink enough fluid to keep your pee clear or pale yellow.  If you were given an antibiotic, take it as told by your doctor. Do not stop taking the antibiotic even if you start to feel better.  Do not use any products that contain nicotine or tobacco, such as cigarettes and e-cigarettes. If you need help quitting, ask your doctor.  Watch for changes in your symptoms. Tell your doctor about them.  If told, keep track of any changes in your blood pressure at home. Tell your doctor about them.  Keep all follow-up visits as told by your doctor. This is important. Contact a doctor if:  You have spasms or you leak pee when you have spasms. Get help right away if:  You have chills or a fever.  You have blood in your pee.  You have a tube that drains the bladder and: ? The tube stops draining pee. ? The tube falls out. Summary  Acute urinary retention means that you cannot pee at all, or that you pee too little and your bladder is not emptied completely. If it is not treated, it can result in kidney damage or other serious problems.  If you were sent home with a tube that drains pee from the bladder, take care of it as told by your doctor.  Pay attention to any changes in your symptoms. Tell your doctor about them. This information is not intended to replace advice given to you by your  health care provider. Make sure you discuss any questions you have with your health care provider. Document Revised: 04/24/2017 Document Reviewed: 06/13/2016 Elsevier Patient Education  2020 Elsevier Inc.  

## 2019-10-28 LAB — URINE CULTURE
MICRO NUMBER:: 10552429
Result:: NO GROWTH
SPECIMEN QUALITY:: ADEQUATE

## 2019-10-29 ENCOUNTER — Other Ambulatory Visit: Payer: Self-pay | Admitting: Family

## 2019-10-29 DIAGNOSIS — G4700 Insomnia, unspecified: Secondary | ICD-10-CM

## 2019-10-29 DIAGNOSIS — F411 Generalized anxiety disorder: Secondary | ICD-10-CM

## 2019-10-31 NOTE — Telephone Encounter (Signed)
Refill request received from pharmacy routed to [facility] for approval.

## 2019-11-18 ENCOUNTER — Other Ambulatory Visit: Payer: Managed Care, Other (non HMO)

## 2019-11-25 ENCOUNTER — Ambulatory Visit: Payer: 59 | Admitting: Family

## 2019-11-29 ENCOUNTER — Other Ambulatory Visit: Payer: Self-pay | Admitting: Family

## 2019-11-29 DIAGNOSIS — F411 Generalized anxiety disorder: Secondary | ICD-10-CM

## 2019-11-29 DIAGNOSIS — G4700 Insomnia, unspecified: Secondary | ICD-10-CM

## 2019-12-01 NOTE — Telephone Encounter (Signed)
Please call patient ask her to schedule appointment for fasting lab work that was supposed to schedule 3 weeks on 10/27/2019.Also need a routine follow up after lab work.will not refilled next month's Ambien and alprazolam until lab work and visit are done.

## 2019-12-08 NOTE — Telephone Encounter (Signed)
Patient called and voicemail was left. 2 more attempts will be made to contact patient.

## 2019-12-15 ENCOUNTER — Inpatient Hospital Stay (HOSPITAL_COMMUNITY)
Admission: AD | Admit: 2019-12-15 | Payer: Self-pay | Source: Other Acute Inpatient Hospital | Admitting: Family Medicine

## 2019-12-21 ENCOUNTER — Telehealth: Payer: Self-pay

## 2019-12-21 NOTE — Telephone Encounter (Signed)
Victoria Floyd please confirm that you'd like me to speak to this patient directly and call her phone? Even though patient mother is stating to not contact the patient. I've only spoken to patient mother and not the patient directly all day. Please Advise.

## 2019-12-21 NOTE — Telephone Encounter (Signed)
Called patient mother "[name]" back and spoke with her about scheduling daughter "Victoria Floyd" an appointment soon with [name], [name], NP or Abbey Chatters, NP/Reed, Tiffany, DO if PCP is out of office. Patient mother  states that patient "Victoria Floyd" isn't ready to speak to any Doctors or NP's right now, because she's so depressed and weak from the situation that occurred at the hospital/urgent care. Mother mentioned that someone in the family a while back committed suicide and that patient "Victoria Floyd" hasn't been the same since that situation. Patient mom "[name] insist that we don't reach out to patient and to give her some time to contact us when she's ready to speak to schedule an appointment. Patient mother "[name]" states that she shouldn't have called and said anything to [place]. I told patient mother that it was good that she called. I notified her that it's very important that we're aware of our patient health and she did the right thing. I informed patient mother "[name]" that I'll pass this message along to PCP [name], Dinah C, NP. Please Advise.

## 2019-12-21 NOTE — Telephone Encounter (Signed)
Please schedule for either office visit if not need a Telephone visit ASAP possible with available provider at the office for follow up hospitalization.

## 2019-12-21 NOTE — Telephone Encounter (Signed)
Okay to call patient.

## 2019-12-21 NOTE — Telephone Encounter (Signed)
I recommend sending her to ED ASAP for evaluation of depression and weakness.patient high risk especially with history of suicide in the family.

## 2019-12-21 NOTE — Telephone Encounter (Signed)
Patient mom "Victoria Floyd" states that her daughter "Victoria Floyd" went to Urgent Care on Thursday 12/15/2019. Patient had symptoms of chills, high temperature, and body aches. Not long after checking in she was transported to [facility]. In the hospital they found out that she had kidney stones that were infected as well as septic with it in her blood stream.  Patient  was told that she was on deaths bed if she would have waited any longer to seek medical help. Patient had stent placed in kidney which will be removed on 12/30/2019. Patient mom states that [name] is very weak, depressed, and dizzy while standing. Patient mom states that patient was in the hospital from Thursday 12/15/2019- Monday 12/19/2019. Patient mom states that she wanted her PCP [name], Dinah C, NP to be informed about her health. Patient mom states that she doesn't want the office to contact her much while she's trying to recover because she is depressed and wont be able to make it into the office. I advised her that her provider [name], Dinah C, NP will contact her to insure that patient has everything she needs. Rather is be medications, or some possible in home assistance. Although patient is depressed this company would not be showing [employer] values by not reaching out to patient during her time of need. I notified patient mom that although she cannot make it into the office we can still do video visits or telehealth visit. Please Advise.

## 2019-12-22 NOTE — Telephone Encounter (Signed)
Okay 

## 2019-12-22 NOTE — Telephone Encounter (Signed)
Patient called on her phone number [phone] at 9:08am. No answer but voicemail was left with office call back number. Patient mother "[name]" also called on phone number [phone] at 9:09am. No answer on this attempt either. Voicemail was left with office call back number. I will attempt to call daughter and mother again later on today.

## 2019-12-23 NOTE — Telephone Encounter (Signed)
Called patient and spoke to her. She states that she hasn't been depressed and that she will have a Telehealth Visit with PCP [name], [name], NP  on Friday 12/30/2019. For a hospital follow up.

## 2019-12-29 ENCOUNTER — Other Ambulatory Visit: Payer: Self-pay | Admitting: Family

## 2019-12-29 DIAGNOSIS — G4700 Insomnia, unspecified: Secondary | ICD-10-CM

## 2019-12-30 ENCOUNTER — Encounter: Payer: Self-pay | Admitting: Family

## 2019-12-30 ENCOUNTER — Ambulatory Visit (INDEPENDENT_AMBULATORY_CARE_PROVIDER_SITE_OTHER): Payer: Managed Care, Other (non HMO) | Admitting: Family

## 2019-12-30 ENCOUNTER — Telehealth: Payer: Self-pay

## 2019-12-30 ENCOUNTER — Other Ambulatory Visit: Payer: Self-pay

## 2019-12-30 DIAGNOSIS — N200 Calculus of kidney: Secondary | ICD-10-CM

## 2019-12-30 DIAGNOSIS — E782 Mixed hyperlipidemia: Secondary | ICD-10-CM | POA: Diagnosis not present

## 2019-12-30 DIAGNOSIS — I10 Essential (primary) hypertension: Secondary | ICD-10-CM | POA: Diagnosis not present

## 2019-12-30 DIAGNOSIS — F411 Generalized anxiety disorder: Secondary | ICD-10-CM | POA: Diagnosis not present

## 2019-12-30 DIAGNOSIS — F32A Depression, unspecified: Secondary | ICD-10-CM

## 2019-12-30 DIAGNOSIS — F329 Major depressive disorder, single episode, unspecified: Secondary | ICD-10-CM

## 2019-12-30 NOTE — Telephone Encounter (Signed)
Pharmacy requested refills. Pended and sent to [facility] for approval.

## 2019-12-30 NOTE — Telephone Encounter (Signed)
Victoria Floyd, dix are scheduled for a virtual visit with your provider today.    Just as we do with appointments in the office, we must obtain your consent to participate.  Your consent will be active for this visit and any virtual visit you may have with one of our providers in the next 365 days.    If you have a MyChart account, I can also send a copy of this consent to you electronically.  All virtual visits are billed to your insurance company just like a traditional visit in the office.  As this is a virtual visit, video technology does not allow for your provider to perform a traditional examination.  This may limit your provider's ability to fully assess your condition.  If your provider identifies any concerns that need to be evaluated in person or the need to arrange testing such as labs, EKG, etc, we will make arrangements to do so.    Although advances in technology are sophisticated, we cannot ensure that it will always work on either your end or our end.  If the connection with a video visit is poor, we may have to switch to a telephone visit.  With either a video or telephone visit, we are not always able to ensure that we have a secure connection.   I need to obtain your verbal consent now.   Are you willing to proceed with your visit today?   Victoria Floyd has provided verbal consent on 12/30/2019 for a virtual visit (video or telephone).   Victoria Floyd, [place] 12/30/2019  2:21 PM

## 2019-12-30 NOTE — Progress Notes (Signed)
This service is provided via telemedicine  No vital signs collected/recorded due to the encounter was a telemedicine visit.   Location of patient (ex: home, work):Home.   Patient consents to a telephone visit: Yes.  Location of the provider (ex: office, home):  [facility].  Name of any referring provider: N/A  Names of all persons participating in the telemedicine service and their role in the encounter: Patient, [name], RMA, Anadelia Kintz, Carilyn Goodpasture, NP.    Time spent on call: 8 minutes spent on the phone with Medical Assistant.     Location:      Place of Service:    Provider: Megham Dwyer FNP-C  Nan Maya, [name], NP  Patient Care Team: Hettie Roselli, [name], NP as PCP - General (Family Medicine) Pyrtle, [name], MD as Consulting Physician (Gastroenterology)  Extended Emergency Contact Information Primary Emergency Contact: Worrell,[name] Address: [address]          [facility] [place], [place] [address] [place] Home Phone: [phone] Relation: Mother  Code Status:  DNR Goals of care: Advanced Directive information Advanced Directives 12/30/2019  Does Patient Have a Medical Advance Directive? No  Would patient like information on creating a medical advance directive? No - Patient declined     Chief Complaint  Patient presents with   Hospitalization Follow-up    Follow up from [facility].     HPI:  Pt is a 54 y.o. female seen today for an acute visit for follow up hospital admission for sepsis and Kidney stone 12/15/2019- 12/19/2019.she states had fever 105 ,generalized body aches and shivering.she went to Urgent care few mile down her house and EMS was called to transport her to [facility].No medical records on chart for review.she was treated for Kidney stone and urine indicated UTI  E.Coli.she was treated with antibiotics.right Kidney stent placed.Her condition improved and was discharged home.she states feeling much better since  discharged just still weak.she was seen today by Urologist Dr.Chao Regan Rakers Stent was removed.she also had an X-ray done to follow up kidney stones which she says results won't be back until next week.she was told by Urologist if still has kidney stone,lithotripsy will be the next option. She states passed one small stone told.she denies nay fever or chills.Also denies any abdominal,back pain,nausea or vomiting.  Of note,patient's mother called [facility] office after patient was discharged from the hospital and reported that patient was very depressed unable to come to in office visit.patient states was still having morphine in her system sleepy but not depressed.    Past Medical History:  Diagnosis Date   Allergy    Anxiety    Chronic constipation    Hypertension    hx of   Insomnia    Past Surgical History:  Procedure Laterality Date   ANTERIOR CERVICAL DECOMP/DISCECTOMY FUSION N/A 07/12/2014   Procedure: ANTERIOR CERVICAL DECOMPRESSION/DISCECTOMY FUSION 2 LEVELS;  Surgeon: [name], MD;  Location: [facility] OR;  Service: Orthopedics;  Laterality: N/A;  Anterior cervical decompression fusion, cervical 5-6, cervical 6-7 with instrumentation, allograft   BUNIONECTOMY Bilateral    CERVICAL DISC SURGERY  07/12/2014   C5 C6 C7    DR [name]   TUBAL LIGATION     WISDOM TOOTH EXTRACTION      Allergies  Allergen Reactions   Codeine Itching    Outpatient Encounter Medications as of 12/30/2019  Medication Sig   ALPRAZolam (XANAX) 1 MG tablet TAKE 1 TABLET BY MOUTH 2 TIMES DAILY AS  NEEDED   atorvastatin (LIPITOR) 10 MG tablet TAKE 1 TABLET BY MOUTH DAILY   buPROPion (WELLBUTRIN XL) 150 MG 24 hr tablet Take 1 tablet (150 mg total) by mouth daily.   buPROPion (WELLBUTRIN XL) 300 MG 24 hr tablet TAKE 1 TABLET BY MOUTH DAILY IN THE MORNING   fluticasone (FLONASE) 50 MCG/ACT nasal spray PLACE 2 SPRAYS INTO BOTH NOSTRILS DAILY AS NEEDED FOR ALLERGIES OR RHINITIS   gabapentin  (NEURONTIN) 300 MG capsule TAKE 2 CAPSULES BY MOUTH TWICE DAILY   HYDROcodone-acetaminophen (NORCO/VICODIN) 5-325 MG tablet Take 1 tablet by mouth as needed.   methocarbamol (ROBAXIN) 500 MG tablet TAKE 2 TABLETS BY MOUTH TWICE DAILY FOR MUSCLE SPAMS   metoprolol tartrate (LOPRESSOR) 100 MG tablet TAKE 2 TABLETS BY MOUTH DAILY   Multiple Vitamins-Minerals (MULTIVITAMIN WITH MINERALS) tablet Take 1 tablet by mouth daily.   zolpidem (AMBIEN) 10 MG tablet TAKE 1 TABLET BY MOUTH AT BEDTIME PLEASE CALL [facility] TO SCHEDULE FUTURE APPOINTMENTS FOR REFILLS PER DR.   No facility-administered encounter medications on file as of 12/30/2019.    Review of Systems  Constitutional: Positive for fatigue. Negative for appetite change, chills and fever.  HENT: Negative for congestion, rhinorrhea, sinus pressure, sinus pain, sneezing and sore throat.   Eyes: Negative for pain, discharge, redness and itching.  Respiratory: Negative for cough, chest tightness, shortness of breath and wheezing.   Cardiovascular: Negative for chest pain, palpitations and leg swelling.  Gastrointestinal: Negative for abdominal distention, abdominal pain, constipation, diarrhea, nausea and vomiting.  Endocrine: Negative for cold intolerance, heat intolerance, polydipsia, polyphagia and polyuria.  Genitourinary: Negative for difficulty urinating, dysuria, flank pain, frequency and urgency.  Musculoskeletal: Negative for arthralgias, gait problem, joint swelling and myalgias.  Skin: Negative for color change, pallor and rash.  Neurological: Negative for dizziness, speech difficulty, light-headedness, numbness and headaches.  Hematological: Does not bruise/bleed easily.  Psychiatric/Behavioral: Negative for agitation, behavioral problems and sleep disturbance. The patient is not nervous/anxious.     Immunization History  Administered Date(s) Administered   Influenza-Unspecified 02/24/2015   Janssen (J&J) SARS-COV-2 Vaccination  10/22/2019   Tdap 02/24/2015   Pertinent  Health Maintenance Due  Topic Date Due   PAP SMEAR-Modifier  Never done   MAMMOGRAM  06/03/2019   INFLUENZA VACCINE  12/25/2019   COLONOSCOPY  08/12/2021   Fall Risk  10/27/2019 01/01/2018 10/27/2017 09/22/2017 11/21/2016  Falls in the past year? 0 No No No No  Number falls in past yr: 0 - - - -  Injury with Fall? 0 - - - -   There were no vitals filed for this visit. There is no height or weight on file to calculate BMI. Physical Exam  Unable to complete on Telephone visit.   Labs reviewed: Recent Labs    01/28/19 1006  NA 140  K 4.5  CL 107  CO2 24  GLUCOSE 72  BUN 17  CREATININE 1.11*  CALCIUM 9.5   Recent Labs    01/28/19 1006  AST 19  ALT 15  BILITOT 0.3  PROT 6.9   Recent Labs    01/28/19 1006  WBC 4.3  NEUTROABS 2,288  HGB 12.6  HCT 37.6  MCV 95.9  PLT 263   Lab Results  Component Value Date   TSH 1.48 01/28/2019   No results found for: HGBA1C Lab Results  Component Value Date   CHOL 260 (H) 01/28/2019   HDL 66 01/28/2019   LDLCALC 177 (H) 01/28/2019   TRIG 68 01/28/2019  CHOLHDL 3.9 01/28/2019    Significant Diagnostic Results in last 30 days:  No results found.  Assessment/Plan 1. Nephrolithiasis Status post hospital admission 12/15/2019-12/19/2019 at [facility] for kidney stones,sepsis and UTI.condition has improved. - continue current antibiotics. - continue to follow up with Urologist Dr.Chao  - encouraged to increase fluid intake  - Notify provider's office if running any fever or chills   2. Essential hypertension Continue on Metoprolol 100 mg tablet two tablets daily   3. Mixed hyperlipidemia Continue on atorvastatin 10 mg tablet daily   4. GAD (generalized anxiety disorder) Stable.continue on Alprazolam 1 mg tablet twice daily   5. Chronic Depression  Mood stable.Continue on Wellbutrin   Family/ staff Communication: Reviewed plan of care with patient  Labs/tests  ordered: CBc/diff,BMP future   Next Appointment: as needed if symptoms worsen or fail to improve   I connected with  [name] on 12/30/19 by a Telephone enabled telemedicine application and verified that I am speaking with the correct person using two identifiers.   I discussed the limitations of evaluation and management by telemedicine. The patient expressed understanding and agreed to proceed.  Spent 12 minutes of non-face to face with patient   Caesar Bookman, NP

## 2020-01-04 ENCOUNTER — Other Ambulatory Visit: Payer: Self-pay | Admitting: Family

## 2020-01-04 DIAGNOSIS — F411 Generalized anxiety disorder: Secondary | ICD-10-CM

## 2020-01-04 NOTE — Telephone Encounter (Signed)
Non-opioid contract completed 04/2019. Has not had recent labs, but does have a lab appointment scheduled for 01/13/20.

## 2020-01-13 ENCOUNTER — Other Ambulatory Visit: Payer: Managed Care, Other (non HMO)

## 2020-02-06 ENCOUNTER — Other Ambulatory Visit: Payer: Self-pay | Admitting: Family

## 2020-02-06 DIAGNOSIS — G4700 Insomnia, unspecified: Secondary | ICD-10-CM

## 2020-02-06 DIAGNOSIS — F411 Generalized anxiety disorder: Secondary | ICD-10-CM

## 2020-02-06 NOTE — Telephone Encounter (Signed)
Pharmacy requested refill Pended Rx and sent to [facility] for approval Contract on file

## 2020-03-19 ENCOUNTER — Other Ambulatory Visit: Payer: Self-pay | Admitting: Family

## 2020-03-19 DIAGNOSIS — F411 Generalized anxiety disorder: Secondary | ICD-10-CM

## 2020-03-19 NOTE — Telephone Encounter (Signed)
Pharmacy requested refill Epic LR: 02/08/2020 Contract on file Pended Rx and sent to [facility] for approval.

## 2020-03-23 ENCOUNTER — Other Ambulatory Visit: Payer: Self-pay | Admitting: Family

## 2020-03-23 DIAGNOSIS — G4700 Insomnia, unspecified: Secondary | ICD-10-CM

## 2020-03-26 NOTE — Telephone Encounter (Signed)
Pharmacy requested refill Epic LR: 02/06/2020 Contract on [employer] Rx and sent to [facility] for approval.

## 2020-04-23 ENCOUNTER — Other Ambulatory Visit: Payer: Self-pay | Admitting: Family

## 2020-04-23 DIAGNOSIS — F411 Generalized anxiety disorder: Secondary | ICD-10-CM

## 2020-04-23 DIAGNOSIS — G5601 Carpal tunnel syndrome, right upper limb: Secondary | ICD-10-CM

## 2020-04-23 DIAGNOSIS — I10 Essential (primary) hypertension: Secondary | ICD-10-CM

## 2020-04-23 DIAGNOSIS — G4700 Insomnia, unspecified: Secondary | ICD-10-CM

## 2020-04-23 DIAGNOSIS — M5412 Radiculopathy, cervical region: Secondary | ICD-10-CM

## 2020-04-23 NOTE — Telephone Encounter (Signed)
High risk or very high risk warning populated when attempting to refill medication (metoprolol). RX request sent to PCP for review and approval if warranted.    Alprazolam last filled 03/19/2020 in Epic  Ambien last filled 03/26/2020 in Epic   Treatment agreement on file from 05/10/2020

## 2020-05-22 ENCOUNTER — Other Ambulatory Visit: Payer: Self-pay | Admitting: Family

## 2020-05-22 DIAGNOSIS — G4700 Insomnia, unspecified: Secondary | ICD-10-CM

## 2020-05-22 DIAGNOSIS — F411 Generalized anxiety disorder: Secondary | ICD-10-CM

## 2020-05-22 NOTE — Telephone Encounter (Signed)
Please make 6 month follow up visit since last visit in 12/2019.

## 2020-06-01 ENCOUNTER — Other Ambulatory Visit: Payer: Self-pay | Admitting: Family

## 2020-06-01 DIAGNOSIS — F411 Generalized anxiety disorder: Secondary | ICD-10-CM

## 2020-06-01 DIAGNOSIS — G4700 Insomnia, unspecified: Secondary | ICD-10-CM

## 2020-06-04 NOTE — Telephone Encounter (Signed)
Both requested medications were last filled on 05/22/2020, according to our records its too early to refill.  I will send to [name], [name], NP for further review

## 2020-06-04 NOTE — Telephone Encounter (Signed)
Too soon to refill.

## 2020-06-12 ENCOUNTER — Other Ambulatory Visit: Payer: Self-pay | Admitting: Family

## 2020-06-12 DIAGNOSIS — G4700 Insomnia, unspecified: Secondary | ICD-10-CM

## 2020-06-12 DIAGNOSIS — F411 Generalized anxiety disorder: Secondary | ICD-10-CM

## 2020-06-21 ENCOUNTER — Other Ambulatory Visit: Payer: Self-pay | Admitting: *Deleted

## 2020-06-21 DIAGNOSIS — F411 Generalized anxiety disorder: Secondary | ICD-10-CM

## 2020-06-21 DIAGNOSIS — G4700 Insomnia, unspecified: Secondary | ICD-10-CM

## 2020-06-21 MED ORDER — ZOLPIDEM TARTRATE 10 MG PO TABS: ORAL_TABLET | ORAL | 0 refills | Status: DC

## 2020-06-21 MED ORDER — ALPRAZOLAM 1 MG PO TABS
ORAL_TABLET | ORAL | 0 refills | Status: DC
Start: 2020-06-21 — End: 2020-07-27

## 2020-06-21 NOTE — Telephone Encounter (Signed)
Patient requested refill.  Contract on file but has expired. Patient aware she needs to make follow up appointment before future refills and stated that she will call back to schedule.   Epic LR: 05/22/2020 Pended Rx and sent to [facility] for approval due to Dinah out of office.

## 2020-07-19 ENCOUNTER — Ambulatory Visit: Payer: Managed Care, Other (non HMO) | Admitting: Family

## 2020-07-26 ENCOUNTER — Ambulatory Visit: Payer: Managed Care, Other (non HMO) | Admitting: Family

## 2020-07-26 DIAGNOSIS — Z029 Encounter for administrative examinations, unspecified: Secondary | ICD-10-CM

## 2020-07-27 ENCOUNTER — Other Ambulatory Visit: Payer: Self-pay

## 2020-07-27 ENCOUNTER — Ambulatory Visit (INDEPENDENT_AMBULATORY_CARE_PROVIDER_SITE_OTHER): Payer: Managed Care, Other (non HMO) | Admitting: Nurse Practitioner

## 2020-07-27 ENCOUNTER — Encounter: Payer: Self-pay | Admitting: Nurse Practitioner

## 2020-07-27 ENCOUNTER — Ambulatory Visit: Payer: Managed Care, Other (non HMO) | Admitting: Family

## 2020-07-27 ENCOUNTER — Telehealth: Payer: Self-pay | Admitting: *Deleted

## 2020-07-27 VITALS — BP 110/80 | HR 54 | Temp 97.5°F | Ht <= 58 in | Wt 139.0 lb

## 2020-07-27 DIAGNOSIS — R197 Diarrhea, unspecified: Secondary | ICD-10-CM

## 2020-07-27 DIAGNOSIS — F411 Generalized anxiety disorder: Secondary | ICD-10-CM | POA: Diagnosis not present

## 2020-07-27 DIAGNOSIS — F32A Depression, unspecified: Secondary | ICD-10-CM

## 2020-07-27 DIAGNOSIS — G9331 Postviral fatigue syndrome: Secondary | ICD-10-CM

## 2020-07-27 DIAGNOSIS — M5412 Radiculopathy, cervical region: Secondary | ICD-10-CM

## 2020-07-27 DIAGNOSIS — E782 Mixed hyperlipidemia: Secondary | ICD-10-CM | POA: Diagnosis not present

## 2020-07-27 DIAGNOSIS — G4700 Insomnia, unspecified: Secondary | ICD-10-CM

## 2020-07-27 DIAGNOSIS — I10 Essential (primary) hypertension: Secondary | ICD-10-CM | POA: Diagnosis not present

## 2020-07-27 DIAGNOSIS — J302 Other seasonal allergic rhinitis: Secondary | ICD-10-CM

## 2020-07-27 DIAGNOSIS — G933 Postviral fatigue syndrome: Secondary | ICD-10-CM

## 2020-07-27 MED ORDER — ALPRAZOLAM 1 MG PO TABS: ORAL_TABLET | ORAL | 0 refills | Status: DC

## 2020-07-27 MED ORDER — METHOCARBAMOL 500 MG PO TABS
500.0000 mg | ORAL_TABLET | Freq: Three times a day (TID) | ORAL | 1 refills | Status: DC | PRN
Start: 2020-07-27 — End: 2021-05-13

## 2020-07-27 MED ORDER — ATORVASTATIN CALCIUM 10 MG PO TABS: ORAL_TABLET | ORAL | 1 refills | Status: DC

## 2020-07-27 MED ORDER — FLUTICASONE PROPIONATE 50 MCG/ACT NA SUSP: 2.0000 | Freq: Every day | NASAL | 11 refills | Status: DC | PRN

## 2020-07-27 MED ORDER — ZOLPIDEM TARTRATE 5 MG PO TABS: ORAL_TABLET | ORAL | 0 refills | Status: DC

## 2020-07-27 MED ORDER — BUPROPION HCL ER (XL) 150 MG PO TB24: ORAL_TABLET | ORAL | 1 refills | Status: DC

## 2020-07-27 MED ORDER — METOPROLOL TARTRATE 100 MG PO TABS: 200.0000 mg | ORAL_TABLET | Freq: Every day | ORAL | 5 refills | Status: DC

## 2020-07-27 MED ORDER — BUPROPION HCL ER (XL) 300 MG PO TB24: ORAL_TABLET | ORAL | 1 refills | Status: DC

## 2020-07-27 MED ORDER — GABAPENTIN 300 MG PO CAPS: ORAL_CAPSULE | ORAL | 1 refills | Status: DC

## 2020-07-27 NOTE — Patient Instructions (Signed)
To start using probiotic twice daily  To start flonase 1 spray twice daily into both nares Okay to use PLAIN nasal saline as needed   To use tylenol 1000 mg by mouth every 8 hours as needed headache, pain, chills.   To decrease Ambien to 5 mg daily at bedtime as needed for sleep To read and follow sleep recommendations to help with better sleep routine.

## 2020-07-27 NOTE — Telephone Encounter (Signed)
Jessica completed FMLA Paperwork for patient from [employer] #[phone] Claim #: [id number] Shanda Bumps Charged $25.00 Patient notified and agreed.  Copy sent for scanning and original left up front for patient pick up.

## 2020-07-27 NOTE — Progress Notes (Signed)
Careteam: Patient Care Team: Lauree Chandler, NP as PCP - General (Geriatric Medicine) Hilarie Fredrickson, Lajuan Lines, MD as Consulting Physician (Gastroenterology)  PLACE OF SERVICE:  [place]  Advanced Directive information    Allergies  Allergen Reactions  . Codeine Itching    Chief Complaint  Patient presents with  . Acute Visit    Patient needs refills on medication and form filled out. Patient states she has been out of work since Feb. 2,2022 with "undiagnosed" COVID. Patient states she is still feeling symptoms. Had really bad headaches, vomiting and diarrhea. Still can taste anything. Patient had fever as high as 103. Patient states she is still having headache and poor appetite and coughing.     HPI: Patient is a 55 y.o. female for follow up She has not been seen for routine follow up in the past year.  She previous pt of Dr Eulas Post and has been seeing Webb Silversmith, NP.  She has been out of work since Feb 2nd, reports she was not feeling well. Reports she feels like she had COVID but was not diagnosised. States she "checked all the boxes" Reports she is still is checking all the boxes She wears a mask at work but not when out in the community.   Reports she has had chronic headache, still has.  States she had fever for 7 days.  Reports nausea, vomiting, diarrhea.  Lost taste and still not able to taste anything-- continues to have no taste.  Cough and congestion, cough better but continues with cough in the evening.  Body aches.  Does not feel like it was the flu Continues with fatigue.  Felt terrible. To day is the first day she drove anywhere Taking tylenol and goody powder, nyquil.  Reports runny nose.  Headache is in sinuses.   Insomnia- continues on ambien 10 mg daily  Allergies- PRN flonase during the spring.   htn-continues on metoprolol 200 mg daily   Anxiety and depression- daughter passed 2 years ago. Recent anniversary, lots of grief. Started on wellbutrin 300  mg daily in the am and 150 mg in the pm. This helped with her mood swings and hot flashes from menopause. She had a son and her mom that she can talk to if needed.  Reports she had a therapist at first but did not feel like it was beneficial. Saw psychiatrist and did not like his suggestions.   Reports she has bad anxiety and panic attacks. Has a hard time sleeping and worried she wont sleep so takes alprazolam every night.  Reports she has tried to take half of ambein.  Reports she sleeps well and anxiety under controlled.  Reports she is not losing her temper and not a "bumbling idiot"   Hyperlipidemia- on lipitor. Had coffee this morning.    Review of Systems:  Review of Systems  Constitutional: Negative for chills, fever and weight loss.  HENT: Positive for congestion. Negative for tinnitus.   Respiratory: Negative for cough, sputum production and shortness of breath.   Cardiovascular: Negative for chest pain, palpitations and leg swelling.  Gastrointestinal: Negative for abdominal pain, constipation, diarrhea and heartburn.  Genitourinary: Negative for dysuria, frequency and urgency.  Musculoskeletal: Positive for neck pain. Negative for back pain, falls, joint pain and myalgias.  Skin: Negative.   Neurological: Negative for dizziness and headaches.  Psychiatric/Behavioral: Positive for depression. Negative for memory loss. The patient is nervous/anxious and has insomnia.     Past Medical History:  Diagnosis Date  .  Allergy   . Anxiety   . Chronic constipation   . Hypertension    hx of  . Insomnia    Past Surgical History:  Procedure Laterality Date  . ANTERIOR CERVICAL DECOMP/DISCECTOMY FUSION N/A 07/12/2014   Procedure: ANTERIOR CERVICAL DECOMPRESSION/DISCECTOMY FUSION 2 LEVELS;  Surgeon: Sinclair Ship, MD;  Location: [facility];  Service: Orthopedics;  Laterality: N/A;  Anterior cervical decompression fusion, cervical 5-6, cervical 6-7 with instrumentation, allograft   . BUNIONECTOMY Bilateral   . CERVICAL DISC SURGERY  07/12/2014   C5 C6 C7    DR [name]  . TUBAL LIGATION    . WISDOM TOOTH EXTRACTION     Social History:   reports that she quit smoking about 12 years ago. Her smoking use included cigarettes. She quit after 15.00 years of use. She has never used smokeless tobacco. She reports current alcohol use. She reports that she does not use drugs.  Family History  Problem Relation Age of Onset  . Colon polyps Mother   . Colon cancer Father 55       passed away age 36   . Esophageal cancer Neg Hx   . Rectal cancer Neg Hx   . Stomach cancer Neg Hx   . Breast cancer Neg Hx     Medications: Patient's Medications  New Prescriptions   No medications on file  Previous Medications   ALPRAZOLAM (XANAX) 1 MG TABLET    Take one tablet by mouth twice daily as needed.   ATORVASTATIN (LIPITOR) 10 MG TABLET    Take one tablet by mouth once daily.   BUPROPION (WELLBUTRIN XL) 150 MG 24 HR TABLET    Take one tablet by mouth once daily in the evening.   BUPROPION (WELLBUTRIN XL) 300 MG 24 HR TABLET    Take one tablet by mouth once daily in the morning.   FLUTICASONE (FLONASE) 50 MCG/ACT NASAL SPRAY    PLACE 2 SPRAYS INTO BOTH NOSTRILS DAILY AS NEEDED FOR ALLERGIES OR RHINITIS   GABAPENTIN (NEURONTIN) 300 MG CAPSULE    TAKE 2 CAPSULES BY MOUTH 2 TIMES DAILY   METHOCARBAMOL (ROBAXIN) 500 MG TABLET    TAKE 2 TABLETS BY MOUTH TWICE DAILY FOR MUSCLE SPAMS   METOPROLOL TARTRATE (LOPRESSOR) 100 MG TABLET    TAKE 2 TABLETS BY MOUTH DAILY   MULTIPLE VITAMINS-MINERALS (MULTIVITAMIN WITH MINERALS) TABLET    Take 1 tablet by mouth daily.   ZOLPIDEM (AMBIEN) 10 MG TABLET    Take one tablet by mouth at bedtime for rest.  Modified Medications   No medications on file  Discontinued Medications   HYDROCODONE-ACETAMINOPHEN (NORCO/VICODIN) 5-325 MG TABLET    Take 1 tablet by mouth as needed.    Physical Exam:  Vitals:   07/27/20 1056  BP: 110/80  Pulse: (!) 54   Temp: (!) 97.5 F (36.4 C)  SpO2: 98%  Weight: 139 lb (63 kg)  Height: $Remove'4\' 10"'FMmBqKl$  (1.473 m)   Body mass index is 29.05 kg/m. Wt Readings from Last 3 Encounters:  07/27/20 139 lb (63 kg)  10/27/19 143 lb 12.8 oz (65.2 kg)  05/11/19 151 lb 9.6 oz (68.8 kg)    Physical Exam Constitutional:      General: She is not in acute distress.    Appearance: She is well-developed and well-nourished. She is not diaphoretic.  HENT:     Head: Normocephalic and atraumatic.     Mouth/Throat:     Mouth: Oropharynx is clear and moist.  Pharynx: No oropharyngeal exudate.  Eyes:     Conjunctiva/sclera: Conjunctivae normal.     Pupils: Pupils are equal, round, and reactive to light.  Cardiovascular:     Rate and Rhythm: Normal rate and regular rhythm.     Heart sounds: Normal heart sounds.  Pulmonary:     Effort: Pulmonary effort is normal.     Breath sounds: Normal breath sounds.  Abdominal:     General: Bowel sounds are normal.     Palpations: Abdomen is soft.  Musculoskeletal:        General: No tenderness or edema.     Cervical back: Normal range of motion and neck supple.  Skin:    General: Skin is warm and dry.  Neurological:     Mental Status: She is alert and oriented to person, place, and time. Mental status is at baseline.  Psychiatric:        Mood and Affect: Mood and affect and mood normal.        Behavior: Behavior normal.     Labs reviewed: Basic Metabolic Panel: No results for input(s): NA, K, CL, CO2, GLUCOSE, BUN, CREATININE, CALCIUM, MG, PHOS, TSH in the last 8760 hours. Liver Function Tests: No results for input(s): AST, ALT, ALKPHOS, BILITOT, PROT, ALBUMIN in the last 8760 hours. No results for input(s): LIPASE, AMYLASE in the last 8760 hours. No results for input(s): AMMONIA in the last 8760 hours. CBC: No results for input(s): WBC, NEUTROABS, HGB, HCT, MCV, PLT in the last 8760 hours. Lipid Panel: No results for input(s): CHOL, HDL, LDLCALC, TRIG, CHOLHDL,  LDLDIRECT in the last 8760 hours. TSH: No results for input(s): TSH in the last 8760 hours. A1C: No results found for: HGBA1C   Assessment/Plan 1. Essential hypertension -controlled on current regimen.  - CMP with eGFR([facility]) - CBC with Differential/Platelet - metoprolol tartrate (LOPRESSOR) 100 MG tablet; Take 2 tablets (200 mg total) by mouth daily.  Dispense: 60 tablet; Refill: 5  2. Mixed hyperlipidemia -to continue dietary modifications and lipitor.  - CMP with eGFR([facility]) - CBC with Differential/Platelet - Lipid Panel - atorvastatin (LIPITOR) 10 MG tablet; Take one tablet by mouth once daily.  Dispense: 90 tablet; Refill: 1  3. Chronic depression -controlled, continues with Wellbutrin   - buPROPion (WELLBUTRIN XL) 150 MG 24 hr tablet; Take one tablet by mouth once daily in the evening.  Dispense: 90 tablet; Refill: 1 - buPROPion (WELLBUTRIN XL) 300 MG 24 hr tablet; Take one tablet by mouth once daily in the morning.  Dispense: 90 tablet; Refill: 1  4. GAD (generalized anxiety disorder) -ongoing, reports panic and anxiety. Reports xanax helps maintain symptoms.  - ALPRAZolam (XANAX) 1 MG tablet; Take one tablet by mouth twice daily as needed.  Dispense: 60 tablet; Refill: 0  5. Insomnia, unspecified type -discussed with pt that max dose on ambien is 5 mg, sleep hygiene discussed with pt and paperwork given.  - zolpidem (AMBIEN) 5 MG tablet; Take one tablet by mouth at bedtime for rest.  Dispense: 30 tablet; Refill: 0  7. Cervical radiculopathy -stable, work with increase pain and muscle spasms.  - gabapentin (NEURONTIN) 300 MG capsule; TAKE 2 CAPSULES BY MOUTH 2 TIMES DAILY  Dispense: 120 capsule; Refill: 1 - methocarbamol (ROBAXIN) 500 MG tablet; Take 1 tablet (500 mg total) by mouth every 8 (eight) hours as needed for muscle spasms.  Dispense: 90 tablet; Refill: 1  8. Chronic seasonal allergic rhinitis -controlled, uses flonase PRN - fluticasone (FLONASE) 50 MCG/ACT  nasal spray; Place 2 sprays into both nostrils daily as needed for allergies or rhinitis.  Dispense: 16 g; Refill: 11  9. Diarrhea -to start probiotic twice daily, bland diet, advance as tolerates, if continues will need to obtain stool samples.   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  -she continues to be fatigued and unable to work long shifts that are required as well as the long work hours. She does physical work.  -continues with cough and congestion, headache and GI symptoms. Supportive measures given.     Next appt: 4 months, sooner if needed Nyashia Raney K. Bray[place], [facility] [phone]

## 2020-07-28 LAB — LIPID PANEL
Cholesterol: 188 mg/dL (ref <200)
HDL: 72 mg/dL (ref >=50)
LDL Cholesterol (Calc): 98 mg/dL (calc)
Non-HDL Cholesterol (Calc): 116 mg/dL (calc) (ref <130)
Total CHOL/HDL Ratio: 2.6 (calc) (ref <5.0)
Triglycerides: 87 mg/dL (ref <150)

## 2020-07-28 LAB — CBC WITH DIFFERENTIAL/PLATELET
Absolute Monocytes: 537 cells/uL (ref 200–950)
Basophils Absolute: 43 cells/uL (ref 0–200)
Basophils Relative: 0.7 %
Eosinophils Absolute: 79 cells/uL (ref 15–500)
Eosinophils Relative: 1.3 %
HCT: 40.1 % (ref 35.0–45.0)
Hemoglobin: 13.7 g/dL (ref 11.7–15.5)
Lymphs Abs: 1873 cells/uL (ref 850–3900)
MCH: 32.1 pg (ref 27.0–33.0)
MCHC: 34.2 g/dL (ref 32.0–36.0)
MCV: 93.9 fL (ref 80.0–100.0)
MPV: 9.6 fL (ref 7.5–12.5)
Monocytes Relative: 8.8 %
Neutro Abs: 3569 cells/uL (ref 1500–7800)
Neutrophils Relative %: 58.5 %
Platelets: 259 10*3/uL (ref 140–400)
RBC: 4.27 10*6/uL (ref 3.80–5.10)
RDW: 12.2 % (ref 11.0–15.0)
Total Lymphocyte: 30.7 %
WBC: 6.1 10*3/uL (ref 3.8–10.8)

## 2020-07-28 LAB — COMPLETE METABOLIC PANEL WITH GFR
AG Ratio: 1.7 (calc) (ref 1.0–2.5)
ALT: 21 U/L (ref 6–29)
AST: 26 U/L (ref 10–35)
Albumin: 4.9 g/dL (ref 3.6–5.1)
Alkaline phosphatase (APISO): 86 U/L (ref 37–153)
BUN/Creatinine Ratio: 17 (calc) (ref 6–22)
BUN: 23 mg/dL (ref 7–25)
CO2: 25 mmol/L (ref 20–32)
Calcium: 10.6 mg/dL — ABNORMAL HIGH (ref 8.6–10.4)
Chloride: 105 mmol/L (ref 98–110)
Creat: 1.33 mg/dL — ABNORMAL HIGH (ref 0.50–1.05)
GFR, Est African American: 52 mL/min/{1.73_m2} — ABNORMAL LOW (ref >=60)
GFR, Est Non African American: 45 mL/min/{1.73_m2} — ABNORMAL LOW (ref >=60)
Globulin: 2.9 g/dL (calc) (ref 1.9–3.7)
Glucose, Bld: 93 mg/dL (ref 65–139)
Potassium: 4.7 mmol/L (ref 3.5–5.3)
Sodium: 140 mmol/L (ref 135–146)
Total Bilirubin: 0.4 mg/dL (ref 0.2–1.2)
Total Protein: 7.8 g/dL (ref 6.1–8.1)

## 2020-07-30 ENCOUNTER — Other Ambulatory Visit: Payer: Self-pay

## 2020-08-02 ENCOUNTER — Telehealth: Payer: Self-pay

## 2020-08-02 NOTE — Telephone Encounter (Signed)
Called patient and read her Victoria Floyd's instructions and let her know if she has any other questions to feel free to call us back she had know questions at this time

## 2020-08-02 NOTE — Telephone Encounter (Signed)
Patient called to ask what she can take for a severe headache she is having and she is also nausea along with it she said her last lab showed that her kidneys are very bad and she can't take ibuprofen or tylenol she doesn't have to have a narcotic she just wants to know what else she can take for relief   Please Advise

## 2020-08-03 ENCOUNTER — Telehealth: Payer: Self-pay | Admitting: *Deleted

## 2020-08-03 NOTE — Telephone Encounter (Signed)
LMOM to return call.

## 2020-08-03 NOTE — Telephone Encounter (Signed)
During the visit she was having more sinus congestion and headache seemed to be coming from this.   Is the headache still frontal? No, its more migraine down into back of neck  Is she using the flonase twice daily? Yes used it once. If she uses it often it makes her nose dry. It is not allergies/Sinus   And nasal rinse?  Yes  does she still having cough? Alittle at night when she lays down.

## 2020-08-03 NOTE — Telephone Encounter (Signed)
Patient called and stated that her Headache is no better and Tylenol is not working.  Patient is wanting to know if you would write her a work note to stay out of work until 09/03/2020.  Wants the note to be Emailed to her.  Please Advise.

## 2020-08-03 NOTE — Telephone Encounter (Signed)
During the visit she was having more sinus congestion and headache seemed to be coming from this. Is the headache still frontal? Is she using the flonase twice daily? And nasal rinse? does she still having cough?

## 2020-08-03 NOTE — Telephone Encounter (Signed)
It sounds like the headache has changed now, has she ever had a migraine before? Also could be worsened by her neck pain.  Can increase tylenol to 1000 mg by mouth every 8 hours. Would recommend using heating pad to neck three times daily. To use her robaxin 500 mg every 8 hours PRN as well, this likely will help pain. To notify if symptoms worsen or fail to improve with this regimen. May need to follow back up in office.

## 2020-08-06 NOTE — Telephone Encounter (Signed)
Patient notified and agreed and stated yes, she has migraines.   Wants to know if you will extend her leave from work to September 03, 2020.  Please Advise.

## 2020-08-07 NOTE — Telephone Encounter (Signed)
Okay to extend leave; he will need to make appt for any other issues or extensions

## 2020-08-07 NOTE — Telephone Encounter (Signed)
Patient notified and agreed. Requested letter to be mailed to address, confirmed.  Letter in mail.

## 2020-08-17 ENCOUNTER — Telehealth: Payer: Self-pay

## 2020-08-17 NOTE — Telephone Encounter (Signed)
Patient called to ask if her form had been received for her time off that was suggested by Shanda Bumps and to ask what she could take for her pain and something to help her sleep as the pain was keeping her up at night and the Tylenol was not helping I asked her if she had been doing as Shanda Bumps had prescribed with heating pad tylenol and Robaxin she said yes and I told her then the next step was for her to be seen again I asked if she would like to come in today and be seen by Dinah she said no I informed her that Shanda Bumps was booked all next week she ask if she could come in on 08/27/2020 and Shanda Bumps did have some opening so I made her an appointment for that day

## 2020-08-21 DIAGNOSIS — Z029 Encounter for administrative examinations, unspecified: Secondary | ICD-10-CM

## 2020-08-22 ENCOUNTER — Telehealth: Payer: Self-pay | Admitting: *Deleted

## 2020-08-22 NOTE — Telephone Encounter (Signed)
Victoria Floyd notified and agreed.  Victoria Floyd is out for lunch. I will have her call Victoria Floyd to collect the $25 for the form fee once she gets back. Victoria Floyd is aware.   Will fax paperwork to [facility].

## 2020-08-22 NOTE — Telephone Encounter (Signed)
[facility] CMS sent Disability Forms for Physician Statement for the extension of leave till 09/03/2020.   Forms filled out and on Clinical Intake desk.  Awaiting for patient to return call.  Paperwork is ready but there is a Careers adviser per [employer].   Needs to be faxed to [facility] Fax#: [phone]. Needs to be copied and sent for scanning after patient is notified.

## 2020-08-27 ENCOUNTER — Encounter: Payer: Self-pay | Admitting: Nurse Practitioner

## 2020-08-27 ENCOUNTER — Ambulatory Visit (INDEPENDENT_AMBULATORY_CARE_PROVIDER_SITE_OTHER): Payer: Managed Care, Other (non HMO) | Admitting: Nurse Practitioner

## 2020-08-27 ENCOUNTER — Other Ambulatory Visit: Payer: Self-pay

## 2020-08-27 VITALS — BP 124/86 | HR 78 | Temp 97.1°F | Ht <= 58 in | Wt 144.0 lb

## 2020-08-27 DIAGNOSIS — F411 Generalized anxiety disorder: Secondary | ICD-10-CM | POA: Diagnosis not present

## 2020-08-27 DIAGNOSIS — I10 Essential (primary) hypertension: Secondary | ICD-10-CM | POA: Diagnosis not present

## 2020-08-27 DIAGNOSIS — G43909 Migraine, unspecified, not intractable, without status migrainosus: Secondary | ICD-10-CM | POA: Diagnosis not present

## 2020-08-27 DIAGNOSIS — G4700 Insomnia, unspecified: Secondary | ICD-10-CM

## 2020-08-27 DIAGNOSIS — N1831 Chronic kidney disease, stage 3a: Secondary | ICD-10-CM

## 2020-08-27 DIAGNOSIS — M159 Polyosteoarthritis, unspecified: Secondary | ICD-10-CM

## 2020-08-27 DIAGNOSIS — M8949 Other hypertrophic osteoarthropathy, multiple sites: Secondary | ICD-10-CM

## 2020-08-27 MED ORDER — VENLAFAXINE HCL ER 37.5 MG PO CP24: 37.5000 mg | ORAL_CAPSULE | Freq: Every day | ORAL | 1 refills | Status: DC

## 2020-08-27 MED ORDER — ALPRAZOLAM 1 MG PO TABS: ORAL_TABLET | ORAL | 0 refills | Status: DC

## 2020-08-27 MED ORDER — ZOLPIDEM TARTRATE 5 MG PO TABS
ORAL_TABLET | ORAL | 0 refills | Status: DC
Start: 2020-08-27 — End: 2020-09-25

## 2020-08-27 NOTE — Progress Notes (Signed)
Careteam: Patient Care Team: Sharon Seller, NP as PCP - General (Geriatric Medicine) Rhea Belton, [name], MD as Consulting Physician (Gastroenterology)  PLACE OF SERVICE:  [facility]  Advanced Directive information    Allergies  Allergen Reactions  . Codeine Itching    Chief Complaint  Patient presents with  . Acute Visit    Generalized pain and not sleeping      HPI: Patient is a 55 y.o. female due to generalized pain. She did not realize how much she needed goody powder and aleve until she was told to stop.   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   Headache- has been taking goodys powder twice daily   Knee and ankles and shoulders ache.   Was on immtrex in the past for headaches and worked well. She has had migraine 3 times a month. Otherwise tension headache   Has PTSD since her daughter died but now she seeing and dreaming about them.    Review of Systems:  Review of Systems  Constitutional: Negative for chills, fever, malaise/fatigue and weight loss.  HENT: Negative for tinnitus.   Respiratory: Negative for cough, sputum production and shortness of breath.   Cardiovascular: Negative for chest pain, palpitations and leg swelling.  Gastrointestinal: Negative for abdominal pain, constipation, diarrhea and heartburn.  Genitourinary: Negative for dysuria, frequency and urgency.  Musculoskeletal: Positive for back pain, joint pain and myalgias. Negative for falls.  Skin: Negative.   Neurological: Positive for headaches. Negative for dizziness.  Psychiatric/Behavioral: Negative for depression and memory loss. The patient is nervous/anxious and has insomnia.     Past Medical History:  Diagnosis Date  . Allergy   . Anxiety   . Chronic constipation   . Hypertension    hx of  . Insomnia    Past Surgical History:  Procedure Laterality Date  . ANTERIOR CERVICAL DECOMP/DISCECTOMY FUSION N/A 07/12/2014   Procedure: ANTERIOR CERVICAL  DECOMPRESSION/DISCECTOMY FUSION 2 LEVELS;  Surgeon: [name], MD;  Location: [facility] OR;  Service: Orthopedics;  Laterality: N/A;  Anterior cervical decompression fusion, cervical 5-6, cervical 6-7 with instrumentation, allograft  . BUNIONECTOMY Bilateral   . CERVICAL DISC SURGERY  07/12/2014   C5 C6 C7    DR [name]  . TUBAL LIGATION    . WISDOM TOOTH EXTRACTION     Social History:   reports that she quit smoking about 12 years ago. Her smoking use included cigarettes. She quit after 15.00 years of use. She has never used smokeless tobacco. She reports previous alcohol use. She reports that she does not use drugs.  Family History  Problem Relation Age of Onset  . Colon polyps Mother   . Colon cancer Father 10       passed away age 36   . Esophageal cancer Neg Hx   . Rectal cancer Neg Hx   . Stomach cancer Neg Hx   . Breast cancer Neg Hx     Medications: Patient's Medications  New Prescriptions   No medications on file  Previous Medications   ALPRAZOLAM (XANAX) 1 MG TABLET    Take one tablet by mouth twice daily as needed.   ATORVASTATIN (LIPITOR) 10 MG TABLET    Take one tablet by mouth once daily.   BUPROPION (WELLBUTRIN XL) 150 MG 24 HR TABLET    Take one tablet by mouth once daily in the evening.   BUPROPION (WELLBUTRIN XL) 300 MG 24 HR TABLET    Take one tablet by  mouth once daily in the morning.   FLUTICASONE (FLONASE) 50 MCG/ACT NASAL SPRAY    Place 2 sprays into both nostrils daily as needed for allergies or rhinitis.   GABAPENTIN (NEURONTIN) 300 MG CAPSULE    TAKE 2 CAPSULES BY MOUTH 2 TIMES DAILY   METHOCARBAMOL (ROBAXIN) 500 MG TABLET    Take 1 tablet (500 mg total) by mouth every 8 (eight) hours as needed for muscle spasms.   METOPROLOL TARTRATE (LOPRESSOR) 100 MG TABLET    Take 2 tablets (200 mg total) by mouth daily.   ZOLPIDEM (AMBIEN) 5 MG TABLET    Take one tablet by mouth at bedtime for rest.  Modified Medications   No medications on file  Discontinued  Medications   No medications on file    Physical Exam:  Vitals:   08/27/20 1531  BP: 124/86  Pulse: 78  Temp: (!) 97.1 F (36.2 C)  TempSrc: Temporal  SpO2: 98%  Weight: 144 lb (65.3 kg)  Height: 4\' 10"  (1.473 m)   Body mass index is 30.1 kg/m. Wt Readings from Last 3 Encounters:  08/27/20 144 lb (65.3 kg)  07/27/20 139 lb (63 kg)  10/27/19 143 lb 12.8 oz (65.2 kg)    Physical Exam Constitutional:      General: She is not in acute distress.    Appearance: She is well-developed. She is not diaphoretic.  HENT:     Head: Normocephalic and atraumatic.     Nose: Nose normal. No congestion.     Mouth/Throat:     Pharynx: No oropharyngeal exudate.  Eyes:     Conjunctiva/sclera: Conjunctivae normal.     Pupils: Pupils are equal, round, and reactive to light.  Cardiovascular:     Rate and Rhythm: Normal rate and regular rhythm.     Heart sounds: Normal heart sounds.  Pulmonary:     Effort: Pulmonary effort is normal.     Breath sounds: Normal breath sounds.  Abdominal:     General: Bowel sounds are normal.     Palpations: Abdomen is soft.  Musculoskeletal:        General: Tenderness present.     Cervical back: Normal range of motion and neck supple.  Skin:    General: Skin is warm and dry.  Neurological:     General: No focal deficit present.     Mental Status: She is alert and oriented to person, place, and time. Mental status is at baseline.     Cranial Nerves: No cranial nerve deficit.     Motor: No weakness.  Psychiatric:        Mood and Affect: Mood normal.        Behavior: Behavior normal.     Labs reviewed: Basic Metabolic Panel: Recent Labs    07/27/20 1156  NA 140  K 4.7  CL 105  CO2 25  GLUCOSE 93  BUN 23  CREATININE 1.33*  CALCIUM 10.6*   Liver Function Tests: Recent Labs    07/27/20 1156  AST 26  ALT 21  BILITOT 0.4  PROT 7.8   No results for input(s): LIPASE, AMYLASE in the last 8760 hours. No results for input(s): AMMONIA in  the last 8760 hours. CBC: Recent Labs    07/27/20 1156  WBC 6.1  NEUTROABS 3,569  HGB 13.7  HCT 40.1  MCV 93.9  PLT 259   Lipid Panel: Recent Labs    07/27/20 1156  CHOL 188  HDL 72  LDLCALC 98  TRIG 87  CHOLHDL 2.6   TSH: No results for input(s): TSH in the last 8760 hours. A1C: No results found for: HGBA1C   Assessment/Plan 1. GAD (generalized anxiety disorder) -ongoing anxiety. Will refill xanax, also Rx for effexor for migraine which will hopefully help anxiety and insomnia. - ALPRAZolam (XANAX) 1 MG tablet; Take one tablet by mouth twice daily as needed.  Dispense: 60 tablet; Refill: 0  2. Insomnia, unspecified type -refill provided, again recommended sleep routine and lifestyle modifications but she does not wish to make changes. She returned handouts to office and stated "these will not work" - zolpidem (AMBIEN) 5 MG tablet; Take one tablet by mouth at bedtime for rest.  Dispense: 30 tablet; Refill: 0  3. Essential hypertension Controlled on current regimen.   4. Migraine without status migrainosus, not intractable, unspecified migraine type -will start effexor for migraine reduction.  - venlafaxine XR (EFFEXOR XR) 37.5 MG 24 hr capsule; Take 1 capsule (37.5 mg total) by mouth daily with breakfast.  Dispense: 30 capsule; Refill: 1  5. Chronic kidney disease, stage 3a (HCC) -possibly worsening renal function due to dehydration with use of NSAIDs. To keep hydration. - BASIC METABOLIC PANEL WITH GFR  6. Primary osteoarthritis involving multiple joints Worsening pain since off goody powder and aleve -renal function improved on labs, will restart mobic to use daily for pain but not to use with any other NSAID. Okay to use tylenol 1000 mg by mouth every 8 hours as needed pain. - meloxicam (MOBIC) 7.5 MG tablet; Take 1 tablet (7.5 mg total) by mouth daily.  Dispense: 30 tablet; Refill: 3  Next appt: 4 weeks.  Janene Harvey. Biagio Borg  [facility] [phone]

## 2020-08-27 NOTE — Patient Instructions (Signed)
florastor - (probiotic) daily  

## 2020-08-28 LAB — BASIC METABOLIC PANEL WITH GFR
BUN: 12 mg/dL (ref 7–25)
CO2: 24 mmol/L (ref 20–32)
Calcium: 9.6 mg/dL (ref 8.6–10.4)
Chloride: 107 mmol/L (ref 98–110)
Creat: 1.03 mg/dL (ref 0.50–1.05)
GFR, Est African American: 71 mL/min/{1.73_m2} (ref >=60)
GFR, Est Non African American: 62 mL/min/{1.73_m2} (ref >=60)
Glucose, Bld: 99 mg/dL (ref 65–139)
Potassium: 4.1 mmol/L (ref 3.5–5.3)
Sodium: 142 mmol/L (ref 135–146)

## 2020-08-28 MED ORDER — MELOXICAM 7.5 MG PO TABS: 7.5000 mg | ORAL_TABLET | Freq: Every day | ORAL | 3 refills | Status: DC

## 2020-09-21 ENCOUNTER — Ambulatory Visit: Payer: Managed Care, Other (non HMO) | Admitting: Nurse Practitioner

## 2020-09-25 ENCOUNTER — Other Ambulatory Visit: Payer: Self-pay | Admitting: Nurse Practitioner

## 2020-09-25 DIAGNOSIS — F411 Generalized anxiety disorder: Secondary | ICD-10-CM

## 2020-09-25 DIAGNOSIS — G4700 Insomnia, unspecified: Secondary | ICD-10-CM

## 2020-09-27 ENCOUNTER — Other Ambulatory Visit: Payer: Self-pay | Admitting: Nurse Practitioner

## 2020-09-27 DIAGNOSIS — M5412 Radiculopathy, cervical region: Secondary | ICD-10-CM

## 2020-09-27 NOTE — Telephone Encounter (Signed)
Only 1 additional refill was authorized on 07/27/2020 by Sharon Seller, NP. I will send to Sharon Seller, NP to review and advise if this was intended to be a long term medications and if so to advise if ok to provider 6-11 additional refills

## 2020-10-29 ENCOUNTER — Other Ambulatory Visit: Payer: Self-pay | Admitting: Nurse Practitioner

## 2020-10-29 DIAGNOSIS — F411 Generalized anxiety disorder: Secondary | ICD-10-CM

## 2020-10-29 DIAGNOSIS — G4700 Insomnia, unspecified: Secondary | ICD-10-CM

## 2020-11-01 ENCOUNTER — Other Ambulatory Visit: Payer: Self-pay | Admitting: Nurse Practitioner

## 2020-11-01 DIAGNOSIS — G43909 Migraine, unspecified, not intractable, without status migrainosus: Secondary | ICD-10-CM

## 2020-11-28 ENCOUNTER — Other Ambulatory Visit: Payer: Self-pay | Admitting: Nurse Practitioner

## 2020-11-28 ENCOUNTER — Other Ambulatory Visit: Payer: Self-pay | Admitting: Adult Health

## 2020-11-28 DIAGNOSIS — M5412 Radiculopathy, cervical region: Secondary | ICD-10-CM

## 2020-11-28 DIAGNOSIS — F411 Generalized anxiety disorder: Secondary | ICD-10-CM

## 2020-11-28 DIAGNOSIS — G4700 Insomnia, unspecified: Secondary | ICD-10-CM

## 2020-11-28 NOTE — Telephone Encounter (Signed)
Patient has request refill on medication "Alprazolam" last refilled 10/29/2020 and "Ambien" last refilled 10/29/2020. Medication pend and sent to PCP Sharon Seller, NP . Please Advise.

## 2020-11-30 ENCOUNTER — Other Ambulatory Visit: Payer: Self-pay

## 2020-11-30 ENCOUNTER — Encounter: Payer: Self-pay | Admitting: Nurse Practitioner

## 2020-11-30 ENCOUNTER — Ambulatory Visit (INDEPENDENT_AMBULATORY_CARE_PROVIDER_SITE_OTHER): Payer: Managed Care, Other (non HMO) | Admitting: Nurse Practitioner

## 2020-11-30 VITALS — BP 120/80 | HR 66 | Temp 97.6°F | Ht <= 58 in | Wt 148.4 lb

## 2020-11-30 DIAGNOSIS — I10 Essential (primary) hypertension: Secondary | ICD-10-CM

## 2020-11-30 DIAGNOSIS — Z1231 Encounter for screening mammogram for malignant neoplasm of breast: Secondary | ICD-10-CM

## 2020-11-30 DIAGNOSIS — F411 Generalized anxiety disorder: Secondary | ICD-10-CM | POA: Diagnosis not present

## 2020-11-30 DIAGNOSIS — F331 Major depressive disorder, recurrent, moderate: Secondary | ICD-10-CM

## 2020-11-30 DIAGNOSIS — Z79899 Other long term (current) drug therapy: Secondary | ICD-10-CM | POA: Diagnosis not present

## 2020-11-30 DIAGNOSIS — E782 Mixed hyperlipidemia: Secondary | ICD-10-CM

## 2020-11-30 DIAGNOSIS — G4700 Insomnia, unspecified: Secondary | ICD-10-CM

## 2020-11-30 NOTE — Progress Notes (Signed)
Careteam: Patient Care Team: Sharon Seller, NP as PCP - General (Geriatric Medicine) Rhea Belton, [name], MD as Consulting Physician (Gastroenterology)  PLACE OF SERVICE:  [facility]  Advanced Directive information Does Patient Have a Medical Advance Directive?: No, Would patient like information on creating a medical advance directive?: No - Patient declined  Allergies  Allergen Reactions   Codeine Itching    Chief Complaint  Patient presents with   Medical Management of Chronic Issues    4 month follow up. Patient would like note for work ti exempt her from wearing steel toed shoes.    Health Maintenance    Pneumonia vaccine, Zoster vaccine, COVID booster, HIV screening, Hep C screening, Pap smear, mammogram     HPI: Patient is a 55 y.o. female for routine follow up.   Chronic headaches- effexor started at last OV not currently taking, made her very sleeping. Headaches have improved.   Anxiety/depression/insomnia-continues on effexor, wellbutrin and xanax for anxiety and depression. Using Ambien to help with sleep.  Hyperlipidemia- controlled on lipitor   Has not had PAP in several years.   Has to wear steal toe shoe covers at work but it is causing her gait to be off due to them being so heavy. Hurting her knees, back and hips.  She has had blistered on toes and shoes.  Already problems with low back pain.   Review of Systems:  Review of Systems  Constitutional:  Negative for chills, fever and weight loss.  HENT:  Negative for tinnitus.   Respiratory:  Negative for cough, sputum production and shortness of breath.   Cardiovascular:  Negative for chest pain, palpitations and leg swelling.  Gastrointestinal:  Negative for abdominal pain, constipation, diarrhea and heartburn.  Genitourinary:  Negative for dysuria, frequency and urgency.  Musculoskeletal:  Negative for back pain, falls, joint pain and myalgias.  Skin: Negative.   Neurological:  Positive for  headaches. Negative for dizziness.  Psychiatric/Behavioral:  Positive for depression. Negative for memory loss. The patient is nervous/anxious and has insomnia.    Past Medical History:  Diagnosis Date   Allergy    Anxiety    Chronic constipation    Hypertension    hx of   Insomnia    Past Surgical History:  Procedure Laterality Date   ANTERIOR CERVICAL DECOMP/DISCECTOMY FUSION N/A 07/12/2014   Procedure: ANTERIOR CERVICAL DECOMPRESSION/DISCECTOMY FUSION 2 LEVELS;  Surgeon: [name], MD;  Location: [facility] OR;  Service: Orthopedics;  Laterality: N/A;  Anterior cervical decompression fusion, cervical 5-6, cervical 6-7 with instrumentation, allograft   BUNIONECTOMY Bilateral    CERVICAL DISC SURGERY  07/12/2014   C5 C6 C7    DR [name]   TUBAL LIGATION     WISDOM TOOTH EXTRACTION     Social History:   reports that she quit smoking about 12 years ago. Her smoking use included cigarettes. She has never used smokeless tobacco. She reports previous alcohol use. She reports that she does not use drugs.  Family History  Problem Relation Age of Onset   Colon polyps Mother    Colon cancer Father 74       passed away age 75    Esophageal cancer Neg Hx    Rectal cancer Neg Hx    Stomach cancer Neg Hx    Breast cancer Neg Hx     Medications: Patient's Medications  New Prescriptions   No medications on file  Previous Medications   ALPRAZOLAM (XANAX) 1 MG TABLET  TAKE 1 TABLET BY MOUTH TWICE DAILY AS NEEDED   ATORVASTATIN (LIPITOR) 10 MG TABLET    Take one tablet by mouth once daily.   BUPROPION (WELLBUTRIN XL) 150 MG 24 HR TABLET    Take one tablet by mouth once daily in the evening.   BUPROPION (WELLBUTRIN XL) 300 MG 24 HR TABLET    Take one tablet by mouth once daily in the morning.   FLUTICASONE (FLONASE) 50 MCG/ACT NASAL SPRAY    Place 2 sprays into both nostrils daily as needed for allergies or rhinitis.   GABAPENTIN (NEURONTIN) 300 MG CAPSULE    TAKE 2 CAPSULES BY  MOUTH 2 TIMES DAILY   MELOXICAM (MOBIC) 7.5 MG TABLET    Take 1 tablet (7.5 mg total) by mouth daily.   METHOCARBAMOL (ROBAXIN) 500 MG TABLET    Take 1 tablet (500 mg total) by mouth every 8 (eight) hours as needed for muscle spasms.   METOPROLOL TARTRATE (LOPRESSOR) 100 MG TABLET    Take 2 tablets (200 mg total) by mouth daily.   ZOLPIDEM (AMBIEN) 5 MG TABLET    TAKE 1 TABLET BY MOUTH AT BEDTIME FOR REST  Modified Medications   No medications on file  Discontinued Medications   VENLAFAXINE XR (EFFEXOR-XR) 37.5 MG 24 HR CAPSULE    TAKE 1 CAPSULE BY MOUTH DAILY WITH BREAKFAST    Physical Exam:  Vitals:   11/30/20 1421  BP: 120/80  Pulse: 66  Temp: 97.6 F (36.4 C)  TempSrc: Temporal  SpO2: 98%  Weight: 148 lb 6.4 oz (67.3 kg)  Height: 4\' 10"  (1.473 m)   Body mass index is 31.02 kg/m. Wt Readings from Last 3 Encounters:  11/30/20 148 lb 6.4 oz (67.3 kg)  08/27/20 144 lb (65.3 kg)  07/27/20 139 lb (63 kg)    Physical Exam Constitutional:      General: She is not in acute distress.    Appearance: She is well-developed. She is not diaphoretic.  HENT:     Head: Normocephalic and atraumatic.     Mouth/Throat:     Pharynx: No oropharyngeal exudate.  Eyes:     Conjunctiva/sclera: Conjunctivae normal.     Pupils: Pupils are equal, round, and reactive to light.  Cardiovascular:     Rate and Rhythm: Normal rate and regular rhythm.     Heart sounds: Normal heart sounds.  Pulmonary:     Effort: Pulmonary effort is normal.     Breath sounds: Normal breath sounds.  Abdominal:     General: Bowel sounds are normal.     Palpations: Abdomen is soft.  Musculoskeletal:     Cervical back: Normal range of motion and neck supple.     Right lower leg: No edema.     Left lower leg: No edema.  Skin:    General: Skin is warm and dry.  Neurological:     Mental Status: She is alert.  Psychiatric:        Mood and Affect: Mood normal.    Labs reviewed: Basic Metabolic Panel: Recent  Labs    07/27/20 1156 08/27/20 1611  NA 140 142  K 4.7 4.1  CL 105 107  CO2 25 24  GLUCOSE 93 99  BUN 23 12  CREATININE 1.33* 1.03  CALCIUM 10.6* 9.6   Liver Function Tests: Recent Labs    07/27/20 1156  AST 26  ALT 21  BILITOT 0.4  PROT 7.8   No results for input(s): LIPASE, AMYLASE in the last  8760 hours. No results for input(s): AMMONIA in the last 8760 hours. CBC: Recent Labs    07/27/20 1156  WBC 6.1  NEUTROABS 3,569  HGB 13.7  HCT 40.1  MCV 93.9  PLT 259   Lipid Panel: Recent Labs    07/27/20 1156  CHOL 188  HDL 72  LDLCALC 98  TRIG 87  CHOLHDL 2.6   TSH: No results for input(s): TSH in the last 8760 hours. A1C: No results found for: HGBA1C   Assessment/Plan 1. Major depressive disorder, recurrent episode, moderate (HCC) -controlled on wellbutrin.   2. Essential hypertension Blood pressure controlled on lopressor - BASIC METABOLIC PANEL WITH GFR  3. GAD (generalized anxiety disorder) Stable at this time. Continues on xanax BID PRN  4. High risk medication use - Urine Drug Screen w/Alc, no confirm([facility])  5. Insomnia, unspecified type -sleeping well on Ambien 5 mg daily   6. Mixed hyperlipidemia -continues on lipitor, encouraged dietary modifications. Follow up lipids prior to next appt  7. Encounter for screening mammogram for malignant neoplasm of breast - MM Digital Screening; Future   Next appt: 4 months for CPE with pap, labs prior to appt Taevion Sikora K. Biagio Borg  [facility] [phone]

## 2020-11-30 NOTE — Patient Instructions (Signed)
Recommend to get Shingrix vaccine at local pharmacy Recommend to get COVID booster

## 2020-12-07 LAB — DRUG MONITOR, PANEL 1, W/CONF, URINE
Alphahydroxyalprazolam: 242 ng/mL — ABNORMAL HIGH (ref <25)
Alphahydroxymidazolam: NEGATIVE ng/mL (ref <50)
Alphahydroxytriazolam: NEGATIVE ng/mL (ref <50)
Aminoclonazepam: NEGATIVE ng/mL (ref <25)
Amphetamines: NEGATIVE ng/mL (ref <500)
Barbiturates: NEGATIVE ng/mL (ref <300)
Benzodiazepines: POSITIVE ng/mL — AB (ref <100)
Cocaine Metabolite: NEGATIVE ng/mL (ref <150)
Codeine: 51 ng/mL — ABNORMAL HIGH (ref <50)
Creatinine: 81.4 mg/dL (ref >=20.0)
Hydrocodone: NEGATIVE ng/mL (ref <50)
Hydromorphone: NEGATIVE ng/mL (ref <50)
Hydroxyethylflurazepam: NEGATIVE ng/mL (ref <50)
Lorazepam: NEGATIVE ng/mL (ref <50)
Marijuana Metabolite: NEGATIVE ng/mL (ref <20)
Methadone Metabolite: NEGATIVE ng/mL (ref <100)
Morphine: NEGATIVE ng/mL (ref <50)
Nordiazepam: NEGATIVE ng/mL (ref <50)
Norhydrocodone: NEGATIVE ng/mL (ref <50)
Opiates: POSITIVE ng/mL — AB (ref <100)
Oxazepam: NEGATIVE ng/mL (ref <50)
Oxidant: NEGATIVE ug/mL (ref <200)
Oxycodone: NEGATIVE ng/mL (ref <100)
Phencyclidine: NEGATIVE ng/mL (ref <25)
Temazepam: NEGATIVE ng/mL (ref <50)
pH: 7 (ref 4.5–9.0)

## 2020-12-07 LAB — BASIC METABOLIC PANEL WITH GFR
BUN/Creatinine Ratio: 16 (calc) (ref 6–22)
BUN: 17 mg/dL (ref 7–25)
CO2: 25 mmol/L (ref 20–32)
Calcium: 9.8 mg/dL (ref 8.6–10.4)
Chloride: 106 mmol/L (ref 98–110)
Creat: 1.07 mg/dL — ABNORMAL HIGH (ref 0.50–1.05)
GFR, Est African American: 68 mL/min/{1.73_m2} (ref >=60)
GFR, Est Non African American: 58 mL/min/{1.73_m2} — ABNORMAL LOW (ref >=60)
Glucose, Bld: 99 mg/dL (ref 65–99)
Potassium: 4.2 mmol/L (ref 3.5–5.3)
Sodium: 140 mmol/L (ref 135–146)

## 2020-12-07 LAB — DM TEMPLATE

## 2020-12-31 ENCOUNTER — Other Ambulatory Visit: Payer: Self-pay | Admitting: Nurse Practitioner

## 2020-12-31 DIAGNOSIS — M8949 Other hypertrophic osteoarthropathy, multiple sites: Secondary | ICD-10-CM

## 2020-12-31 DIAGNOSIS — G4700 Insomnia, unspecified: Secondary | ICD-10-CM

## 2020-12-31 DIAGNOSIS — M159 Polyosteoarthritis, unspecified: Secondary | ICD-10-CM

## 2020-12-31 DIAGNOSIS — F411 Generalized anxiety disorder: Secondary | ICD-10-CM

## 2020-12-31 NOTE — Telephone Encounter (Signed)
Patient has request refill on medications "Xanax", "Ambien", and 'Mobic". Patient medications wont let me selective sign Mobic. Patient is due for all refills. Medication pend and sent to PCP Janyth Contes Janene Harvey, NP for approval. Please Advise.

## 2021-01-01 NOTE — Telephone Encounter (Signed)
A reminder from 11/30/20 labs Drug screen came back positive for opiates which we are not prescribing and does not appear she has a prescription for. This is against her contact which she signed on 07/27/20. We will no longer be able to prescribe her Ambien or alprazolam and recommend going to a specialist (psychiatrist) for furtherprescriptions

## 2021-01-03 ENCOUNTER — Other Ambulatory Visit: Payer: Self-pay | Admitting: Nurse Practitioner

## 2021-01-03 DIAGNOSIS — F411 Generalized anxiety disorder: Secondary | ICD-10-CM

## 2021-01-03 DIAGNOSIS — G4700 Insomnia, unspecified: Secondary | ICD-10-CM

## 2021-02-02 ENCOUNTER — Other Ambulatory Visit: Payer: Self-pay | Admitting: Nurse Practitioner

## 2021-02-02 DIAGNOSIS — E782 Mixed hyperlipidemia: Secondary | ICD-10-CM

## 2021-02-02 DIAGNOSIS — F32A Depression, unspecified: Secondary | ICD-10-CM

## 2021-02-04 NOTE — Telephone Encounter (Signed)
High risk or very high risk warning populated when attempting to refill medication. RX request sent to PCP for review and approval if warranted.   

## 2021-02-12 ENCOUNTER — Other Ambulatory Visit: Payer: Self-pay | Admitting: Nurse Practitioner

## 2021-02-12 DIAGNOSIS — F32A Depression, unspecified: Secondary | ICD-10-CM

## 2021-02-13 ENCOUNTER — Other Ambulatory Visit: Payer: Self-pay | Admitting: *Deleted

## 2021-02-13 DIAGNOSIS — F32A Depression, unspecified: Secondary | ICD-10-CM

## 2021-02-13 MED ORDER — BUPROPION HCL ER (XL) 150 MG PO TB24: ORAL_TABLET | ORAL | 1 refills | Status: DC

## 2021-02-13 NOTE — Telephone Encounter (Signed)
Patient called and stated that she needed a refill on her Wellbutrin 300.  We have confirmation that we sent Rx on 02/04/2021 Patient will call pharmacy and check on Rx.

## 2021-02-13 NOTE — Addendum Note (Signed)
Addended by: Nelda Severe A on: 02/13/2021 10:24 AM   Modules accepted: Orders

## 2021-02-13 NOTE — Telephone Encounter (Signed)
Patient Requested refill.  Has upcoming appointment scheduled Pended Rx and sent to [facility] for approval due to HIGH ALERT Warning.

## 2021-02-13 NOTE — Telephone Encounter (Signed)
Patient called back and stated that she needed a refill on her Wellbutrin 150.  Has an appointment scheduled for 10/10

## 2021-02-28 ENCOUNTER — Telehealth: Payer: Self-pay | Admitting: *Deleted

## 2021-02-28 NOTE — Telephone Encounter (Signed)
Okay thank you for the clarification.

## 2021-02-28 NOTE — Telephone Encounter (Signed)
Patient stated that she is aware that she has 2 appointments. Stated that she scheduled the one on Monday to discuss her Medications and the other one is for a physical with Pap per patient.  Confirmed appointment date and times with patient and agreed.

## 2021-02-28 NOTE — Telephone Encounter (Signed)
-----   Message from Sharon Seller, NP sent at 02/28/2021  8:52 AM EDT ----- Victoria Floyd has appt for next Monday and appt for next month for physical. I remember taylor saying she made the appt but was not sure she needed it.  Can you call her and clarify if this is an acute visit? Otherwise we can see her on November for her regular follow up.   Thank you.

## 2021-03-04 ENCOUNTER — Encounter: Payer: Self-pay | Admitting: Nurse Practitioner

## 2021-03-04 ENCOUNTER — Ambulatory Visit (INDEPENDENT_AMBULATORY_CARE_PROVIDER_SITE_OTHER): Payer: Managed Care, Other (non HMO) | Admitting: Nurse Practitioner

## 2021-03-04 ENCOUNTER — Other Ambulatory Visit: Payer: Self-pay

## 2021-03-04 VITALS — BP 112/80 | HR 58 | Temp 97.8°F | Wt 151.0 lb

## 2021-03-04 DIAGNOSIS — F411 Generalized anxiety disorder: Secondary | ICD-10-CM | POA: Diagnosis not present

## 2021-03-04 DIAGNOSIS — I10 Essential (primary) hypertension: Secondary | ICD-10-CM

## 2021-03-04 DIAGNOSIS — G4700 Insomnia, unspecified: Secondary | ICD-10-CM | POA: Diagnosis not present

## 2021-03-04 DIAGNOSIS — F331 Major depressive disorder, recurrent, moderate: Secondary | ICD-10-CM

## 2021-03-04 MED ORDER — TRAZODONE HCL 50 MG PO TABS: 25.0000 mg | ORAL_TABLET | Freq: Every day | ORAL | 1 refills | Status: DC

## 2021-03-04 NOTE — Patient Instructions (Signed)
Melatonin 1-6 mg by mouth as needed for sleep.   Trazodone start 25 mg by mouth daily at bedtime.   [facility]  [address] [place], [place] [address] Phone: [phone]  [facility] [address] [place], [place] [address] Phone: [phone]  [facility] [address] [place], [place] [address] Phone [phone]  Levonne Hubert, [place], [place], [place] [address] [place], [place] [address] [phone] In home visit available with prior arrangement

## 2021-03-04 NOTE — Progress Notes (Signed)
Careteam: Patient Care Team: Sharon Seller, NP as PCP - General (Geriatric Medicine) Rhea Belton, [name], MD as Consulting Physician (Gastroenterology)  PLACE OF SERVICE:  [facility]  Advanced Directive information    Allergies  Allergen Reactions   Codeine Itching    Chief Complaint  Patient presents with   Acute Visit    Discuss medications      HPI: Patient is a 55 y.o. female here for follow up.  UDS showed up positive for opioids on last office visit in July and she was informed we could no longer prescribe her controlled substances due to breaking her contract that she signed in March 2022.  She admits to taking an old prescription and that is why she tested positive. Educated was provided on the adverse effects of taking these medications together and risk for overdose and death.   Reports that mood has been stable on wellbutrin but is feeling more anxious. She continues to grieve the loss of her daughter. States that she was "tapering what was left of alprazolam prescription" and ran out two days ago. She reports that previously she was taking alprazolam 1 mg daily and only using the other tablet as needed therefore had enough left to take and was able to taper.   she has not followed up with a psychiatrists and does not wish to do so. She has seen therapist before but not interested in going back to them for counseling.   Insomnia- Has trouble falling asleep. Sleeps about 2-3 hours every 3rd night each week. On other days will doze off but does not feel well rested. Feels is related to feeling anxious prior to sleeping. Has tried OTC melatonin but continues to have trouble sleeping.   Continues meloxicam and gabapentin for aches  and robaxin for muscle spasms  IBS-Continues to experience diarrhea about once every couple of weeks. Feels is also related to feeling anxious.    Review of Systems:  Review of Systems  Constitutional:  Negative for chills, fever and  weight loss.  HENT:  Positive for tinnitus.   Eyes:  Negative for pain.  Respiratory:  Positive for shortness of breath.        With anxiety   Cardiovascular:  Negative for chest pain, palpitations and leg swelling.  Gastrointestinal:  Positive for constipation and diarrhea. Negative for blood in stool.  Genitourinary:  Negative for dysuria, frequency and urgency.  Musculoskeletal:  Positive for myalgias.  Neurological:  Negative for dizziness, weakness and headaches.  Psychiatric/Behavioral:  Positive for depression. The patient is nervous/anxious and has insomnia.    Past Medical History:  Diagnosis Date   Allergy    Anxiety    Chronic constipation    Hypertension    hx of   Insomnia    Past Surgical History:  Procedure Laterality Date   ANTERIOR CERVICAL DECOMP/DISCECTOMY FUSION N/A 07/12/2014   Procedure: ANTERIOR CERVICAL DECOMPRESSION/DISCECTOMY FUSION 2 LEVELS;  Surgeon: [name], MD;  Location: [facility] OR;  Service: Orthopedics;  Laterality: N/A;  Anterior cervical decompression fusion, cervical 5-6, cervical 6-7 with instrumentation, allograft   BUNIONECTOMY Bilateral    CERVICAL DISC SURGERY  07/12/2014   C5 C6 C7    DR [name]   TUBAL LIGATION     WISDOM TOOTH EXTRACTION     Social History:   reports that she quit smoking about 13 years ago. Her smoking use included cigarettes. She has never used smokeless tobacco. She reports that she does not currently  use alcohol. She reports that she does not use drugs.  Family History  Problem Relation Age of Onset   Colon polyps Mother    Colon cancer Father 64       passed away age 12    Esophageal cancer Neg Hx    Rectal cancer Neg Hx    Stomach cancer Neg Hx    Breast cancer Neg Hx     Medications: Patient's Medications  New Prescriptions   No medications on file  Previous Medications   ALPRAZOLAM (XANAX) 1 MG TABLET    TAKE 1 TABLET BY MOUTH TWICE DAILY AS NEEDED   ATORVASTATIN (LIPITOR) 10 MG TABLET     TAKE 1 TABLET BY MOUTH ONCE DAILY   BUPROPION (WELLBUTRIN XL) 150 MG 24 HR TABLET    Take one tablet by mouth once daily in the evening.   BUPROPION (WELLBUTRIN XL) 300 MG 24 HR TABLET    TAKE 1 TABLET BY MOUTH ONCE DAILY IN THEMORNING   FLUTICASONE (FLONASE) 50 MCG/ACT NASAL SPRAY    Place 2 sprays into both nostrils daily as needed for allergies or rhinitis.   GABAPENTIN (NEURONTIN) 300 MG CAPSULE    TAKE 2 CAPSULES BY MOUTH 2 TIMES DAILY   MELOXICAM (MOBIC) 7.5 MG TABLET    TAKE 1 TABLET BY MOUTH DAILY   METHOCARBAMOL (ROBAXIN) 500 MG TABLET    Take 1 tablet (500 mg total) by mouth every 8 (eight) hours as needed for muscle spasms.   METOPROLOL TARTRATE (LOPRESSOR) 100 MG TABLET    Take 2 tablets (200 mg total) by mouth daily.   ZOLPIDEM (AMBIEN) 5 MG TABLET    TAKE 1 TABLET BY MOUTH AT BEDTIME FOR REST  Modified Medications   No medications on file  Discontinued Medications   No medications on file    Physical Exam:  Vitals:   03/04/21 1405  BP: 112/80  Pulse: (!) 58  Temp: 97.8 F (36.6 C)  TempSrc: Temporal  SpO2: 98%  Weight: 151 lb (68.5 kg)   Body mass index is 31.56 kg/m. Wt Readings from Last 3 Encounters:  03/04/21 151 lb (68.5 kg)  11/30/20 148 lb 6.4 oz (67.3 kg)  08/27/20 144 lb (65.3 kg)    Physical Exam Constitutional:      General: She is not in acute distress.    Appearance: Normal appearance.  HENT:     Head: Normocephalic and atraumatic.     Right Ear: Tympanic membrane normal.     Left Ear: Tympanic membrane normal.     Mouth/Throat:     Mouth: Mucous membranes are dry.  Eyes:     Pupils: Pupils are equal, round, and reactive to light.  Cardiovascular:     Rate and Rhythm: Normal rate and regular rhythm.     Pulses: Normal pulses.     Heart sounds: Normal heart sounds.  Pulmonary:     Effort: Pulmonary effort is normal.     Breath sounds: Normal breath sounds.  Abdominal:     General: Bowel sounds are normal.  Musculoskeletal:         General: No tenderness.     Cervical back: Normal range of motion and neck supple.     Right lower leg: No edema.     Left lower leg: No edema.  Skin:    General: Skin is warm.  Neurological:     Mental Status: She is alert and oriented to person, place, and time.  Psychiatric:  Attention and Perception: Attention normal.        Mood and Affect: Mood is anxious.    Labs reviewed: Basic Metabolic Panel: Recent Labs    07/27/20 1156 08/27/20 1611 11/30/20 1446  NA 140 142 140  K 4.7 4.1 4.2  CL 105 107 106  CO2 25 24 25   GLUCOSE 93 99 99  BUN 23 12 17   CREATININE 1.33* 1.03 1.07*  CALCIUM 10.6* 9.6 9.8   Liver Function Tests: Recent Labs    07/27/20 1156  AST 26  ALT 21  BILITOT 0.4  PROT 7.8   No results for input(s): LIPASE, AMYLASE in the last 8760 hours. No results for input(s): AMMONIA in the last 8760 hours. CBC: Recent Labs    07/27/20 1156  WBC 6.1  NEUTROABS 3,569  HGB 13.7  HCT 40.1  MCV 93.9  PLT 259   Lipid Panel: Recent Labs    07/27/20 1156  CHOL 188  HDL 72  LDLCALC 98  TRIG 87  CHOLHDL 2.6   TSH: No results for input(s): TSH in the last 8760 hours. A1C: No results found for: HGBA1C   Assessment/Plan  1. GAD (generalized anxiety disorder) Encouraged to follow up with psychiatrists and resources given. She has tapered herself off alprazolam. Start trazodone to help with insomnia and anxiety. Can also consider SSRI or SNRI to help with symptom management as well. - traZODone (DESYREL) 50 MG tablet; Take 0.5-1 tablets (25-50 mg total) by mouth at bedtime.  Dispense: 30 tablet; Refill: 1  2. Insomnia, unspecified type Discussed the importance of following sleep routines.  Start trazodone and continue melatonin can take 1-6 mg by mouth as needed for sleep. Has given handouts for sleep in the past and she returned them stating they would not help her. Again encouraged her to do some lifestyle modifications to help with sleep.  -  traZODone (DESYREL) 50 MG tablet; Take 0.5-1 tablets (25-50 mg total) by mouth at bedtime.  Dispense: 30 tablet; Refill: 1  3. Essential hypertension BP controlled 112/80. Continues Metoprolol.  4. Major depressive disorder, recurrent episode, moderate (HCC) Stable on wellbutrin. No SI or HI.  Next appt: 04/02/2021 as scheduled.   I personally was present during the history, physical exam and medical decision-making activities of this service and have verified that the service and findings are accurately documented in the student's note  Colene Mines K. 09/26/20  [facility] [phone]

## 2021-04-02 ENCOUNTER — Other Ambulatory Visit: Payer: Managed Care, Other (non HMO)

## 2021-04-02 ENCOUNTER — Other Ambulatory Visit: Payer: Self-pay

## 2021-04-02 DIAGNOSIS — E782 Mixed hyperlipidemia: Secondary | ICD-10-CM

## 2021-04-02 DIAGNOSIS — I10 Essential (primary) hypertension: Secondary | ICD-10-CM

## 2021-04-02 LAB — COMPREHENSIVE METABOLIC PANEL
AG Ratio: 1.7 (calc) (ref 1.0–2.5)
ALT: 17 U/L (ref 6–29)
AST: 20 U/L (ref 10–35)
Albumin: 4.4 g/dL (ref 3.6–5.1)
Alkaline phosphatase (APISO): 65 U/L (ref 37–153)
BUN/Creatinine Ratio: 15 (calc) (ref 6–22)
BUN: 16 mg/dL (ref 7–25)
CO2: 25 mmol/L (ref 20–32)
Calcium: 9.6 mg/dL (ref 8.6–10.4)
Chloride: 106 mmol/L (ref 98–110)
Creat: 1.06 mg/dL — ABNORMAL HIGH (ref 0.50–1.03)
Globulin: 2.6 g/dL (calc) (ref 1.9–3.7)
Glucose, Bld: 83 mg/dL (ref 65–99)
Potassium: 3.7 mmol/L (ref 3.5–5.3)
Sodium: 140 mmol/L (ref 135–146)
Total Bilirubin: 0.4 mg/dL (ref 0.2–1.2)
Total Protein: 7 g/dL (ref 6.1–8.1)

## 2021-04-02 LAB — CBC WITH DIFFERENTIAL/PLATELET
Absolute Monocytes: 366 cells/uL (ref 200–950)
Basophils Absolute: 22 cells/uL (ref 0–200)
Basophils Relative: 0.5 %
Eosinophils Absolute: 138 cells/uL (ref 15–500)
Eosinophils Relative: 3.2 %
HCT: 36.7 % (ref 35.0–45.0)
Hemoglobin: 12.2 g/dL (ref 11.7–15.5)
Lymphs Abs: 1905 cells/uL (ref 850–3900)
MCH: 31.5 pg (ref 27.0–33.0)
MCHC: 33.2 g/dL (ref 32.0–36.0)
MCV: 94.8 fL (ref 80.0–100.0)
MPV: 9.6 fL (ref 7.5–12.5)
Monocytes Relative: 8.5 %
Neutro Abs: 1871 cells/uL (ref 1500–7800)
Neutrophils Relative %: 43.5 %
Platelets: 230 10*3/uL (ref 140–400)
RBC: 3.87 10*6/uL (ref 3.80–5.10)
RDW: 11.9 % (ref 11.0–15.0)
Total Lymphocyte: 44.3 %
WBC: 4.3 10*3/uL (ref 3.8–10.8)

## 2021-04-02 LAB — LIPID PANEL
Cholesterol: 184 mg/dL (ref <200)
HDL: 68 mg/dL (ref >=50)
LDL Cholesterol (Calc): 99 mg/dL (calc)
Non-HDL Cholesterol (Calc): 116 mg/dL (calc) (ref <130)
Total CHOL/HDL Ratio: 2.7 (calc) (ref <5.0)
Triglycerides: 79 mg/dL (ref <150)

## 2021-04-05 ENCOUNTER — Ambulatory Visit: Payer: Managed Care, Other (non HMO) | Admitting: Nurse Practitioner

## 2021-04-15 ENCOUNTER — Other Ambulatory Visit: Payer: Self-pay | Admitting: Nurse Practitioner

## 2021-04-15 ENCOUNTER — Other Ambulatory Visit: Payer: Self-pay | Admitting: Family

## 2021-04-15 DIAGNOSIS — M5412 Radiculopathy, cervical region: Secondary | ICD-10-CM

## 2021-04-15 DIAGNOSIS — I10 Essential (primary) hypertension: Secondary | ICD-10-CM

## 2021-04-15 NOTE — Telephone Encounter (Signed)
Patient has request refill on medication "Metoprolol 100mg " Patient medication was last refilled on 07/27/2020. Medication has High Risk Warnings. Medication pend and sent to PCP 09/26/2020 Janyth Contes, NP for approval. Please Advise.

## 2021-05-06 ENCOUNTER — Telehealth: Payer: Self-pay | Admitting: *Deleted

## 2021-05-06 NOTE — Telephone Encounter (Signed)
Victoria Floyd received a letter from [facility] stating that patient was Overdue for a Mammogram. Stated that their records show that patient is over 50 and has not had a mammogram in the last two years.   Tried calling patient for reminder to schedule a mammogram and had to [facility] for patient to return call.   Awaiting callback.

## 2021-05-07 NOTE — Telephone Encounter (Signed)
Patient Notified and stated that she will call and schedule herself an appointment for a Mammogram. Agreed.

## 2021-05-13 ENCOUNTER — Other Ambulatory Visit: Payer: Self-pay | Admitting: Nurse Practitioner

## 2021-05-13 ENCOUNTER — Other Ambulatory Visit: Payer: Self-pay | Admitting: Family

## 2021-05-13 DIAGNOSIS — F411 Generalized anxiety disorder: Secondary | ICD-10-CM

## 2021-05-13 DIAGNOSIS — M5412 Radiculopathy, cervical region: Secondary | ICD-10-CM

## 2021-05-13 DIAGNOSIS — M159 Polyosteoarthritis, unspecified: Secondary | ICD-10-CM

## 2021-05-13 DIAGNOSIS — G4700 Insomnia, unspecified: Secondary | ICD-10-CM

## 2021-05-13 NOTE — Telephone Encounter (Signed)
Patient has request refill on medications "Meloxicam", nnand "Trazodone". Patient last refill for both medications in order are 01/01/2021, and 03/04/2021. Medication has Warnings. Medication pend and sent to PCP Janyth Contes Janene Harvey, NP for approval.

## 2021-06-06 ENCOUNTER — Other Ambulatory Visit: Payer: Self-pay

## 2021-06-06 ENCOUNTER — Encounter: Payer: Self-pay | Admitting: Psychiatry

## 2021-06-06 ENCOUNTER — Ambulatory Visit (INDEPENDENT_AMBULATORY_CARE_PROVIDER_SITE_OTHER): Payer: 59 | Admitting: Psychiatry

## 2021-06-06 VITALS — BP 126/82 | HR 59 | Ht <= 58 in | Wt 148.0 lb

## 2021-06-06 DIAGNOSIS — F411 Generalized anxiety disorder: Secondary | ICD-10-CM | POA: Diagnosis not present

## 2021-06-06 DIAGNOSIS — G4700 Insomnia, unspecified: Secondary | ICD-10-CM

## 2021-06-06 DIAGNOSIS — F331 Major depressive disorder, recurrent, moderate: Secondary | ICD-10-CM

## 2021-06-06 DIAGNOSIS — F4310 Post-traumatic stress disorder, unspecified: Secondary | ICD-10-CM | POA: Diagnosis not present

## 2021-06-06 MED ORDER — ALPRAZOLAM 1 MG PO TABS: ORAL_TABLET | ORAL | 1 refills | Status: DC

## 2021-06-06 MED ORDER — ZOLPIDEM TARTRATE 5 MG PO TABS: 5.0000 mg | ORAL_TABLET | Freq: Every day | ORAL | 1 refills | Status: DC

## 2021-06-06 MED ORDER — VILAZODONE HCL 10 MG PO TABS: ORAL_TABLET | ORAL | 1 refills | Status: DC

## 2021-06-06 NOTE — Progress Notes (Signed)
[facility] MD/PA/NP Initial Note  06/07/2021 7:28 PM Victoria Floyd  MRN:  [id number]  Chief Complaint:  Chief Complaint   Anxiety; Insomnia; Establish Care     HPI: Pt is a 56 yo female being seen for initial evaluation for anxiety, insomnia, and depression. She reports, "I'm grieving the loss of my daughter. She died by suicide... I saw things a mother should never see... I need to stop the nightmares." Daughter died 4 years ago in March. First nightmare was about grandson drowning in her pool and being unable to save him. She reports that she has frequent nightmares about people being trapped, burning, and calling out for her and she can't get to them. She reports intrusive memories from daughter's suicide and from past calls as an EMT. She reports that she will re-experience this event repeatedly. She reports an increase in intrusive memories and flashbacks. She reports exaggerated startle response and hyper-vigilance. Avoids drinking ETOH since this increases memories. She reports anxiety with waiting in lines and in certain situations. Avoids crowds and will shop when she knows the store is less busy. She reports that she is having panic attacks at times, at least twice a month. She is having to leave work at times due to panic. She reports that she feels "disconnected" from people and no longer enjoys socializing.   She has been missing work due to anxiety and nightmares. She reports that she is sometimes waking up with increased HR and panic s/s and unable to recall a nightmare. Notices muscle tension.   She reports that sometimes she sleeps a few hours and is ok. Other nights she awakens with panic. "Some nights I just can't shut my brain off." She reports frequent worry and some catastrophic thinking. 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 Denies any checking behaviors. She  reports that she has had anxiety since her son joined the [employer], "but this is different."   She reports that she was taking Xanax 1 mg po BID and Ambien. She reports that she told PCP that she was having increased pain from muscle tension and took some remaining pain medication that had been prescribed to her in the past (at total of #4 tabs on separate occasions). She reports that PCP then stopped prescribing Xanax and Ambien. She reports that she experienced increased HR, shaking, and sweating that has resolved. She reports that she has had worsening anxiety and insomnia since that time.   She reports she has occasional irritability when she has not slept well. She reports that she has had persistent sad mood. Energy has been low. She reports that she used to enjoy walking and no longer goes on walks. She has not been keeping up with house work. Cooking less. She reports adequate hygiene. She reports that she has gained weight since daughter's death. She reports that she will "stress eat." Enjoys seeing grandchildren. Diminished interest in things. She reports that she is no longer interested in socializing or dating.She reports that she is tired and making mistakes at work. Difficulty with concentration and focus. Reports feeling excessive guilt and wonders what she could have done to prevent daughter's suicide. Denies SI.   Denies periods of excessive energy and increased goal-directed activity. Denies h/o elevated moods. Denies periods of decreased need for sleep. Denies excessive spending.   Denies AH or VH.   [place]  and [place]. Only child. "I had an awesome childhood." [place] Has 2 college degrees. Married in 09-23-90 and divorced in 23-Sep-1998. Son's father died when son was 52 yo. Son is almost 31 yo. Daughter committed suicide 4 years ago. Daughter would be 33 yo this month. Daughter was a paramedic and was living in [place]. 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 Son is in [employer]	[place] and is stationed in [place] and goes to see him and his family every holiday. Enjoys grandchildren. Mother is supportive and lives in [place]. Goes to see her mother every Friday and takes her shopping. Has lost touch with friends.   Works five 12-hour shifts a week.  Past Psychiatric Medication Trials: Ambien- Able to fall asleep. Sometimes still had dreams. Took for about 20 years. Xanax- Prescribed two 1 mg tabs for about 20 years. Effexor- Sleepy Zoloft- Has increased anxiety  Wellbutrin XL- Reports that this has been helpful for her mood and hot flashes. Gabapentin-Prescribed for pain. Trazodone-Increased anxiety, nausea  Visit Diagnosis:    ICD-10-CM   1. Major depressive disorder, recurrent episode, moderate (HCC)  F33.1 DISCONTINUED: Vilazodone HCl (VIIBRYD) 10 MG TABS    2. Insomnia, unspecified type  G47.00 zolpidem (AMBIEN) 5 MG tablet    3. GAD (generalized anxiety disorder)  F41.1 ALPRAZolam (XANAX) 1 MG tablet    DISCONTINUED: Vilazodone HCl (VIIBRYD) 10 MG TABS    4. PTSD (post-traumatic stress disorder)  F43.10 DISCONTINUED: Vilazodone HCl (VIIBRYD) 10 MG TABS      Past Psychiatric History: She saw a psychiatrist immediately after daughter's death. Saw a therapist through [facility] a few times after the loss her daughter.   Past Medical History:  Past Medical History:  Diagnosis Date   Allergy    Anxiety    Chronic constipation    Hypertension    hx of   Insomnia     Past Surgical History:  Procedure Laterality Date   ANTERIOR CERVICAL DECOMP/DISCECTOMY FUSION N/A 07/12/2014   Procedure: ANTERIOR CERVICAL DECOMPRESSION/DISCECTOMY FUSION 2 LEVELS;  Surgeon: [name], MD;  Location: [facility] OR;  Service: Orthopedics;  Laterality: N/A;  Anterior cervical decompression fusion, cervical 5-6, cervical 6-7 with instrumentation, allograft   BUNIONECTOMY Bilateral    CERVICAL DISC SURGERY  07/12/2014    C5 C6 C7    DR [name]   TUBAL LIGATION     WISDOM TOOTH EXTRACTION      Family History:  Family History  Problem Relation Age of Onset   Depression Mother    Anxiety disorder Mother    Colon polyps Mother    Colon cancer Father 17       passed away age 57    Alcohol abuse Daughter    Suicidality Daughter    Esophageal cancer Neg Hx    Rectal cancer Neg Hx    Stomach cancer Neg Hx    Breast cancer Neg Hx     Social History:  Social History   Socioeconomic History   Marital status: Single    Spouse name: Not on file   Number of children: 2   Years of education: Not on file   Highest education level: Associate degree: occupational, Scientist, product/process development, or vocational program  Occupational History    Comment: full time  Tobacco Use   Smoking status: Former    Years: 15.00    Types: Cigarettes    Quit date: 12/06/2007  Years since quitting: 13.5   Smokeless tobacco: Never   Tobacco comments:    Quit age 47   Vaping Use   Vaping Use: Former  Substance and Sexual Activity   Alcohol use: Not Currently   Drug use: No   Sexual activity: Yes  Other Topics Concern   Not on file  Social History Narrative   DIET: I eat healthy       DO YOU DRINK/EAT THINGS WITH CAFFEINE: Yes      MARITAL STATUS: Divorced      WHAT YEAR WERE YOU MARRIED: 1992      DO YOU LIVE IN A HOUSE, APARTMENT, ASSISTED LIVING, CONDO TRAILER ETC.:  House      IS IT ONE OR MORE STORIES: Yes      HOW MANY PERSONS LIVE IN YOUR HOME: 1      DO YOU HAVE PETS IN YOUR HOME: 2 cats      CURRENT OR PAST PROFESSION: machinist/ EMT      DO YOU EXERCISE: yes      WHAT TYPE AND HOW OFTEN: occasionally   Social Determinants of Health   Financial Resource Strain: Not on file  Food Insecurity: Not on file  Transportation Needs: Not on file  Physical Activity: Not on file  Stress: Not on file  Social Connections: Not on file    Allergies:  Allergies  Allergen Reactions   Codeine Itching     Metabolic Disorder Labs: No results found for: HGBA1C, MPG No results found for: PROLACTIN Lab Results  Component Value Date   CHOL 184 04/02/2021   TRIG 79 04/02/2021   HDL 68 04/02/2021   CHOLHDL 2.7 04/02/2021   VLDL 19 11/21/2016   LDLCALC 99 04/02/2021   LDLCALC 98 07/27/2020   Lab Results  Component Value Date   TSH 1.48 01/28/2019   TSH 2.33 01/01/2018    Therapeutic Level Labs: No results found for: LITHIUM No results found for: VALPROATE No components found for:  CBMZ  Current Medications: Current Outpatient Medications  Medication Sig Dispense Refill   atorvastatin (LIPITOR) 10 MG tablet TAKE 1 TABLET BY MOUTH ONCE DAILY 90 tablet 1   Biotin 1 MG CAPS Take by mouth.     buPROPion (WELLBUTRIN XL) 150 MG 24 hr tablet Take one tablet by mouth once daily in the evening. 90 tablet 1   buPROPion (WELLBUTRIN XL) 300 MG 24 hr tablet TAKE 1 TABLET BY MOUTH ONCE DAILY IN THEMORNING 90 tablet 3   fluticasone (FLONASE) 50 MCG/ACT nasal spray Place 2 sprays into both nostrils daily as needed for allergies or rhinitis. 16 g 11   gabapentin (NEURONTIN) 300 MG capsule TAKE 2 CAPSULES BY MOUTH 2 TIMES DAILY 120 capsule 5   meloxicam (MOBIC) 7.5 MG tablet TAKE 1 TABLET BY MOUTH DAILY 30 tablet 3   methocarbamol (ROBAXIN) 500 MG tablet TAKE 2 TABLETS BY MOUTH TWICE DAILY FOR MUSCLE SPAMS 120 tablet 1   metoprolol tartrate (LOPRESSOR) 100 MG tablet TAKE 2 TABLETS BY MOUTH DAILY 60 tablet 5   Multiple Vitamin (MULTIVITAMIN) tablet Take 1 tablet by mouth daily.     ALPRAZolam (XANAX) 1 MG tablet Take 1/2-1 tablet twice daily as needed 45 tablet 1   Vilazodone HCl (VIIBRYD) 10 MG TABS Take 1/2 tablet in the morning with breakfast for 7-10 days, then increase to 1 tablet in the morning with breakfast. 30 tablet 1   zolpidem (AMBIEN) 5 MG tablet Take 1 tablet (5 mg total) by  mouth at bedtime. 30 tablet 1   No current facility-administered medications for this visit.    Medication  Side Effects: none  Orders placed this visit:  No orders of the defined types were placed in this encounter.   Psychiatric Specialty Exam:  Review of Systems  Constitutional: Negative.   HENT:  Positive for tinnitus.   Eyes: Negative.   Respiratory: Negative.    Cardiovascular: Negative.   Gastrointestinal: Negative.   Endocrine: Negative.   Genitourinary: Negative.   Musculoskeletal:  Positive for arthralgias and myalgias.  Skin: Negative.   Allergic/Immunologic: Negative.   Neurological:  Positive for headaches.  Hematological: Negative.   Psychiatric/Behavioral:         Please refer to HPI   Blood pressure 126/82, pulse (!) 59, height 4\' 9"  (1.448 m), weight 148 lb (67.1 kg), last menstrual period 07/12/2014.Body mass index is 32.03 kg/m.  General Appearance: Casual  Eye Contact:  Good  Speech:  Clear and Coherent and Normal Rate  Volume:  Normal  Mood:  Anxious and Depressed  Affect:  Blunt, Congruent, and Anxious  Thought Process:  Coherent, Linear, and Descriptions of Associations: Intact  Orientation:  Full (Time, Place, and Person)  Thought Content: Logical, Hallucinations: None, and Rumination   Suicidal Thoughts:  No  Homicidal Thoughts:  No  Memory:  WNL  Judgement:  Good  Insight:  Good  Psychomotor Activity:  Normal and Tense  Concentration:  Concentration: Good  Recall:  Good  Fund of Knowledge: Good  Language: Good  Assets:  Communication Skills Resilience Vocational/Educational  ADL's:  Intact  Cognition: WNL  Prognosis:  Good   Screenings:  PHQ2-9    Flowsheet Row [facility]  PHQ-2 Total Score 0 0 0       Receiving Psychotherapy: No   Treatment Plan/Recommendations: Pt seen for 60 minutes and time spent discussing that she is describing s/s consistent with PTSD and that trauma therapy would  likely be beneficial. Pt agrees to starting therapy. [facility]. Discussed that an SSRI or SNRI is typically helpful with lowering overall anxiety. She reports history of adverse reactions to some SSRI's. Discussed potential benefits, risks, and side effects of Viibryd. Pt agrees to trial of Viibryd. Will start Viibryd 5 mg po qd with a meal for one week, then increase to 10 mg po qd for anxiety and depressive signs and symptoms. Will re-start Ambien 5 mg po QHS for insomnia since she reports that this was effective for insomnia in the past. Will re-start Xanax 1 mg 1/2-1 tab po BID prn anxiety. Recommended trial of moving Wellbutrin XL 150 mg to morning with 300 mg tab to minimize sleep disturbance. Pt to follow-up in 4-6 weeks or sooner if clinically indicated. Patient advised to contact office with any questions, adverse effects, or acute worsening in signs and symptoms.   Corie ChiquitoJessica Virgilene Stryker, PMHNP

## 2021-06-07 ENCOUNTER — Telehealth: Payer: Self-pay | Admitting: Psychiatry

## 2021-06-07 ENCOUNTER — Other Ambulatory Visit: Payer: Self-pay

## 2021-06-07 DIAGNOSIS — F331 Major depressive disorder, recurrent, moderate: Secondary | ICD-10-CM

## 2021-06-07 DIAGNOSIS — F4310 Post-traumatic stress disorder, unspecified: Secondary | ICD-10-CM

## 2021-06-07 DIAGNOSIS — F411 Generalized anxiety disorder: Secondary | ICD-10-CM

## 2021-06-07 MED ORDER — VILAZODONE HCL 10 MG PO TABS: ORAL_TABLET | ORAL | 1 refills | Status: DC

## 2021-06-07 NOTE — Telephone Encounter (Signed)
Pt LVM reporting Vilazodone Rx (Viibryd) was $600. Pt needs something else. Call Pt @ [phone].

## 2021-06-07 NOTE — Telephone Encounter (Signed)
Patient asked that Rx be sent to [facility] on [address], cost for Lorin Picket is much cheaper there.

## 2021-06-07 NOTE — Telephone Encounter (Signed)
Next visit is 07/16/21. [facility] states that

## 2021-06-10 NOTE — Telephone Encounter (Signed)
Would you be able to verify if this is the cost for generic after insurance coverage?

## 2021-06-10 NOTE — Telephone Encounter (Signed)
I did speak with the pharmacist and after running it is $78.00/month. he also mentioned it's cheaper through [facility]

## 2021-06-10 NOTE — Telephone Encounter (Signed)
Thanks for checking. Would you please call the patient with this information? Please find out if the [facility] price is cost prohibitive.

## 2021-06-10 NOTE — Telephone Encounter (Signed)
Pt needs alternative 

## 2021-06-10 NOTE — Telephone Encounter (Signed)
I did notice it was sent to [facility], she can use [facility] there and get for about $30.00 but I will still check into her insurance as well.

## 2021-06-10 NOTE — Telephone Encounter (Signed)
Yes I will call and check if she is able to use co-pay card as well.

## 2021-06-11 NOTE — Telephone Encounter (Signed)
LVM to rtc 

## 2021-06-12 NOTE — Telephone Encounter (Signed)
Pt stated pharmacy is on backorder and she will call to see if another [facility] has it in stock

## 2021-06-14 NOTE — Telephone Encounter (Signed)
Please follow-up with her and see if she was able to start Viibryd.

## 2021-06-14 NOTE — Telephone Encounter (Signed)
Pt stated [facility] is calling [facility] in [place] to see if they have it and will call us back

## 2021-06-21 NOTE — Telephone Encounter (Signed)
Please follow up with patient.

## 2021-06-24 NOTE — Telephone Encounter (Signed)
Lvm to rtc °

## 2021-06-25 NOTE — Telephone Encounter (Signed)
Still unable to reach Pt but will keep trying

## 2021-07-12 ENCOUNTER — Telehealth: Payer: Self-pay | Admitting: Psychiatry

## 2021-07-12 NOTE — Telephone Encounter (Signed)
Victoria Floyd called because she has an appt with you 2/21.  She is coming to report how the Viibryd has worked for her, but she has been unable to find this medication.  It is on backorder and she hasn't been to get the medication to try.  It is also expensive.  She is going to use GoodRx to bring cost down.  Do we have samples she could try?  Or since she can't get the medication, is there something else she could try?  Also, she should now cancel her appt?  Please call her to discuss

## 2021-07-15 NOTE — Telephone Encounter (Signed)
Called patient. Mail box is full

## 2021-07-15 NOTE — Telephone Encounter (Signed)
Called patient again. Unable to LM.

## 2021-07-16 ENCOUNTER — Ambulatory Visit: Payer: 59 | Admitting: Psychiatry

## 2021-07-16 NOTE — Telephone Encounter (Signed)
Patient has an appt with Janett Billow today. Sent chat message to her about Viibryd.

## 2021-08-05 ENCOUNTER — Encounter: Payer: Self-pay | Admitting: Psychiatry

## 2021-08-05 ENCOUNTER — Other Ambulatory Visit: Payer: Self-pay

## 2021-08-05 ENCOUNTER — Ambulatory Visit (INDEPENDENT_AMBULATORY_CARE_PROVIDER_SITE_OTHER): Payer: 59 | Admitting: Psychiatry

## 2021-08-05 DIAGNOSIS — F4310 Post-traumatic stress disorder, unspecified: Secondary | ICD-10-CM | POA: Diagnosis not present
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
## 2021-08-16 ENCOUNTER — Other Ambulatory Visit: Payer: Self-pay | Admitting: Psychiatry

## 2021-08-16 ENCOUNTER — Other Ambulatory Visit: Payer: Self-pay | Admitting: Nurse Practitioner

## 2021-08-16 DIAGNOSIS — G4700 Insomnia, unspecified: Secondary | ICD-10-CM

## 2021-08-16 DIAGNOSIS — F411 Generalized anxiety disorder: Secondary | ICD-10-CM

## 2021-08-16 DIAGNOSIS — F32A Depression, unspecified: Secondary | ICD-10-CM

## 2021-08-16 DIAGNOSIS — E782 Mixed hyperlipidemia: Secondary | ICD-10-CM

## 2021-08-16 NOTE — Telephone Encounter (Signed)
High risk warning populated when attempting to approve refill request. Will send to Eubanks, Jessica K, NP to review.   

## 2021-08-19 NOTE — Telephone Encounter (Signed)
Received refill request for patient. Patient is overdue for follow-up. Please call to schedule follow-up appointment.  

## 2021-08-22 NOTE — Telephone Encounter (Signed)
Voicemail full unable to lvm ?

## 2021-09-03 ENCOUNTER — Ambulatory Visit: Payer: 59 | Admitting: Psychiatry

## 2021-09-04 ENCOUNTER — Encounter: Payer: Self-pay | Admitting: Gastroenterology

## 2021-09-17 ENCOUNTER — Other Ambulatory Visit: Payer: Self-pay | Admitting: Nurse Practitioner

## 2021-09-17 ENCOUNTER — Other Ambulatory Visit: Payer: Self-pay | Admitting: Psychiatry

## 2021-09-17 DIAGNOSIS — F411 Generalized anxiety disorder: Secondary | ICD-10-CM

## 2021-09-17 DIAGNOSIS — G4700 Insomnia, unspecified: Secondary | ICD-10-CM

## 2021-09-17 DIAGNOSIS — M159 Polyosteoarthritis, unspecified: Secondary | ICD-10-CM

## 2021-09-17 NOTE — Telephone Encounter (Signed)
Patient has request refill on medication "Meloxicam". Patient medication last refilled 05/13/2021. Patient medication has High Risk Warnings. Medication pend and sent to PCP Dewaine Oats [place] [employer], NP for refill.  ?

## 2021-09-17 NOTE — Telephone Encounter (Signed)
Received refill request for patient. Patient is overdue for follow-up. Please call to schedule follow-up appointment.  

## 2021-09-18 NOTE — Telephone Encounter (Signed)
UTR voice mail full ?

## 2021-09-24 ENCOUNTER — Ambulatory Visit (INDEPENDENT_AMBULATORY_CARE_PROVIDER_SITE_OTHER): Payer: 59 | Admitting: Psychiatry

## 2021-09-24 DIAGNOSIS — F4310 Post-traumatic stress disorder, unspecified: Secondary | ICD-10-CM

## 2021-09-24 NOTE — Progress Notes (Signed)
?      [facility] Counselor/Therapist Progress Note ? ?Patient ID: TAMMEE THIELKE, MRN: [id number],   ? ?Date: 09/24/2021 ? ?Time Spent: 50 minutes start time 3:06 PM end time 3:56 PM ? ?Treatment Type: Individual Therapy ? ?Reported Symptoms: nightmares, migraines, anxiety, focusing issues, rumination, sadness, grief issues, irritability, sleep issues ? ?Mental Status Exam: ? ?Appearance:   Casual and Neat     ?Behavior:  Appropriate  ?Motor:  Normal  ?Speech/Language:   Normal Rate  ?Affect:  Appropriate  ?Mood:  sad  ?Thought process:  normal  ?Thought content:    WNL  ?Sensory/Perceptual disturbances:    WNL  ?Orientation:  oriented to person, place, time/date, and situation  ?Attention:  Good  ?Concentration:  Good  ?Memory:  WNL  ?Fund of knowledge:   Good  ?Insight:    Good  ?Judgment:   Good  ?Impulse Control:  Good  ? ?Risk Assessment: ?Danger to Self:  No ?Self-injurious Behavior: No ?Danger to Others: No ?Duty to Warn:no ?Physical Aggression / Violence:No  ?Access to Firearms a concern: No  ?Gang Involvement:No  ? ?Subjective: Patient was present for session.  She shared she was still not doing well.  Developed treatment plan and set goals. She shared she is working 12 hours a day 6 days a week except when she has bad nights due to nightmares that lead to migraines.  Patient was taught different grounding exercises to try her prior to sleeping including grounding and 5, counting backwards from 100 by twos and threes or threes, and brain spotting vergence.  Patient was encouraged to try doing a brain dump sorted journaling prior to bedtime to release anything negative.  Also discussed different trauma therapy options including EMDR and brain spotting and encouraged her to research them over the next few weeks.  Patient reported that she did not have a follow-up appointment for Corie Chiquito her provider.  Made sure she was able to get one and check out to discuss the difficulties she is having with sleep  that is keeping her from being able to get to work about 1 day a week due to the nightmares and sleeplessness that are creating migraines for patient. ? ?Interventions: Solution-Oriented/Positive Psychology ? ?Diagnosis: ?  ICD-10-CM   ?1. PTSD (post-traumatic stress disorder)  F43.10   ?  ? ? ?Plan: Patient is to practice coping skills discussed in session including grounding and 5, counting backwards from 100 by twos or threes, and brain spotting exercise vergence.  Patient is to meet with her provider Corie Chiquito, PMHNP.  The patient is to take medication as directed.  Patient is to try doing a brain dump sort of journaling prior to sleep. ?Long-term goal: Develop and implement effective coping skills to carry out normal responsibilities and participate constructively in relationships ?Short-term goal: Sleep without being disturbed by dreams of trauma: Verbalize hopeful and positive statements regarding the future ? ?Stevphen Meuse, [facility] ? ? ? ? ? ? ? ? ? ? ? ? ? ? ? ? ? ? ?

## 2021-09-25 ENCOUNTER — Ambulatory Visit (INDEPENDENT_AMBULATORY_CARE_PROVIDER_SITE_OTHER): Payer: 59 | Admitting: Psychiatry

## 2021-09-25 DIAGNOSIS — G4700 Insomnia, unspecified: Secondary | ICD-10-CM

## 2021-09-25 DIAGNOSIS — F331 Major depressive disorder, recurrent, moderate: Secondary | ICD-10-CM | POA: Diagnosis not present

## 2021-09-25 DIAGNOSIS — F411 Generalized anxiety disorder: Secondary | ICD-10-CM

## 2021-09-25 DIAGNOSIS — F4310 Post-traumatic stress disorder, unspecified: Secondary | ICD-10-CM

## 2021-09-25 MED ORDER — VILAZODONE HCL 10 MG PO TABS: ORAL_TABLET | ORAL | 1 refills | Status: DC

## 2021-09-25 MED ORDER — ALPRAZOLAM 1 MG PO TABS: ORAL_TABLET | ORAL | 1 refills | Status: DC

## 2021-09-25 MED ORDER — ZOLPIDEM TARTRATE 5 MG PO TABS: 5.0000 mg | ORAL_TABLET | Freq: Every day | ORAL | 2 refills | Status: DC

## 2021-09-25 NOTE — Progress Notes (Signed)
Victoria DunMonica F Clinger ?[id number] ?08-11-1965 ?56 y.o. ? ?Subjective:  ? ?Patient ID:  Victoria Floyd is a 56 y.o. (DOB 08-11-1965) female. ? ?Chief Complaint:  ?Chief Complaint  ?Patient presents with  ? Anxiety  ? Depression  ? Insomnia  ? ? ?HPI ?Victoria DunMonica F Kumari presents to the office today for follow-up of anxiety, depression, and insomnia. She reports that she had difficulty getting Viibryd and that several pharmacies did not have it in stock or it was more expensive.  ? ?She reports that she has been sleeping better with Ambien and able to fall asleep easier without having multiple intrusive memories. She denies any side effects with Ambien. She reports that nightmares occur about once a month and she will wake up with panic symptoms and this will trigger a severe headache or migraine. She has missed 5 or 6 days since January. She reports that her employer talked with her about absences and discussed intermittent FMLA.  ? ?She reports frequent intrusive memories and flashbacks. Continued hypervigilance and exaggerated startle response. She experiences some worry. Has some panic s/s at times in crowds or waiting in line at a store. She reports that her mood is sad at times, particularly around anniversaries and holidays. Denies persistent sad mood. "I want my happy back." She reports that she has been socially withdrawn and isolates. Motivation has been low. Energy is ok. Diminished interest in things-ie, used to enjoy planting flowers and has not done this recently. Concentration is ok at work. She reports that her appetite has been good. Reports 30 lbs weight gain since daughter's death. Denies SI.  ? ?Works 12-hour days, 6 days a week to help keep her thoughts occupied. She helps take care of her mother.  ? ?Past Psychiatric Medication Trials: ?Ambien- Able to fall asleep. Sometimes still had dreams. Took for about 20 years. ?Xanax- Prescribed two 1 mg tabs for about 20 years. ?Effexor- Sleepy ?Zoloft- Has increased  anxiety  ?Wellbutrin XL- Reports that this has been helpful for her mood and hot flashes. ?Gabapentin-Prescribed for pain. ?Trazodone-Increased anxiety, nausea ? ?PHQ2-9   ? ?Flowsheet Row [facility]  ?PHQ-2 Total Score 0 0 0  ? ?  ?  ? ?Review of Systems:  ?Review of Systems  ?Gastrointestinal:   ?     Occ GI s/s with panic  ?Allergic/Immunologic: Positive for environmental allergies.  ?Neurological:  Positive for headaches.  ? ?Medications: I have reviewed the patient's current medications. ? ?Current Outpatient Medications  ?Medication Sig Dispense Refill  ? atorvastatin (LIPITOR) 10 MG tablet TAKE 1 TABLET BY MOUTH ONCE DAILY 90 tablet 1  ? Biotin 1 MG CAPS Take by mouth.    ? buPROPion (WELLBUTRIN XL) 150 MG 24 hr tablet TAKE 1 TABLET BY MOUTH ONCE DAILY IN THEEVENING 90 tablet 1  ? buPROPion (WELLBUTRIN XL) 300 MG 24 hr tablet TAKE 1 TABLET BY MOUTH ONCE DAILY IN THEMORNING 90 tablet 3  ? fluticasone (FLONASE) 50 MCG/ACT nasal spray Place 2 sprays into both nostrils daily as needed for allergies or rhinitis. 16 g 11  ? gabapentin (NEURONTIN) 300 MG capsule TAKE 2 CAPSULES BY MOUTH 2 TIMES DAILY 120 capsule 5  ? meloxicam (MOBIC) 7.5 MG tablet TAKE 1 TABLET BY MOUTH DAILY 30 tablet 5  ? methocarbamol (ROBAXIN) 500 MG tablet TAKE  2 TABLETS BY MOUTH TWICE DAILY FOR MUSCLE SPAMS 120 tablet 1  ? metoprolol tartrate (LOPRESSOR) 100 MG tablet TAKE 2 TABLETS BY MOUTH DAILY 60 tablet 5  ? Multiple Vitamin (MULTIVITAMIN) tablet Take 1 tablet by mouth daily.    ? [START ON 10/16/2021] ALPRAZolam (XANAX) 1 MG tablet TAKE 1/2 TO 1 TABLET BY MOUTH TWICE DAILY AS NEEDED 45 tablet 1  ? Vilazodone HCl (VIIBRYD) 10 MG TABS Take 1/2 tablet in the morning with breakfast for 7-10 days, then increase to 1 tablet in the morning with  breakfast. 30 tablet 1  ? [START ON 10/16/2021] zolpidem (AMBIEN) 5 MG tablet Take 1 tablet (5 mg total) by mouth at bedtime. 30 tablet 2  ? ?No current facility-administered medications for this visit.  ? ? ?Medication Side Effects: None ? ?Allergies:  ?Allergies  ?Allergen Reactions  ? Codeine Itching  ? ? ?Past Medical History:  ?Diagnosis Date  ? Allergy   ? Anxiety   ? Chronic constipation   ? Hypertension   ? hx of  ? Insomnia   ? ? ?Past Medical History, Surgical history, Social history, and Family history were reviewed and updated as appropriate.  ? ?Please see review of systems for further details on the patient's review from today.  ? ?Objective:  ? ?Physical Exam:  ?LMP 07/12/2014 Comment: Tubal Ligation and Neg Preg test ? ?Physical Exam ?Constitutional:   ?   General: She is not in acute distress. ?Musculoskeletal:     ?   General: No deformity.  ?Neurological:  ?   Mental Status: She is alert and oriented to person, place, and time.  ?   Coordination: Coordination normal.  ?Psychiatric:     ?   Attention and Perception: Attention and perception normal. She does not perceive auditory or visual hallucinations.     ?   Mood and Affect: Mood is anxious and depressed. Affect is not labile, blunt, angry or inappropriate.     ?   Speech: Speech normal.     ?   Behavior: Behavior normal.     ?   Thought Content: Thought content normal. Thought content is not paranoid or delusional. Thought content does not include homicidal or suicidal ideation. Thought content does not include homicidal or suicidal plan.     ?   Cognition and Memory: Cognition and memory normal.     ?   Judgment: Judgment normal.  ?   Comments: Insight intact  ? ? ?Lab Review:  ?   ?Component Value Date/Time  ? NA 140 04/02/2021 1525  ? K 3.7 04/02/2021 1525  ? CL 106 04/02/2021 1525  ? CO2 25 04/02/2021 1525  ? GLUCOSE 83 04/02/2021 1525  ? BUN 16 04/02/2021 1525  ? CREATININE 1.06 (H) 04/02/2021 1525  ? CALCIUM 9.6 04/02/2021 1525  ? PROT  7.0 04/02/2021 1525  ? ALBUMIN 4.4 11/21/2016 1510  ? AST 20 04/02/2021 1525  ? ALT 17 04/02/2021 1525  ? ALKPHOS 73 11/21/2016 1510  ? BILITOT 0.4 04/02/2021 1525  ? GFRNONAA 58 (L) 11/30/2020 1446  ? GFRAA 68 11/30/2020 1446  ? ? ?   ?Component Value Date/Time  ? WBC 4.3 04/02/2021 1525  ? RBC 3.87 04/02/2021 1525  ? HGB 12.2 04/02/2021 1525  ? HCT 36.7 04/02/2021 1525  ? PLT 230 04/02/2021 1525  ? MCV 94.8 04/02/2021 1525  ? MCH 31.5 04/02/2021 1525  ? MCHC 33.2 04/02/2021 1525  ? RDW 11.9 04/02/2021 1525  ? LYMPHSABS  1,905 04/02/2021 1525  ? MONOABS 424 11/21/2016 1510  ? EOSABS 138 04/02/2021 1525  ? BASOSABS 22 04/02/2021 1525  ? ? ?No results found for: POCLITH, LITHIUM  ? ?No results found for: PHENYTOIN, PHENOBARB, VALPROATE, CBMZ  ? ?.res ?Assessment: Plan:   ?Pt seen for 30 minutes and time spent discussing treatment options and intermittent FMLA.  ?Recommend intermittent leave of absence for 1-2 occurrences a month lasting 1-2 days in duration for times when she is experiencing an exacerbation of PTSD s/s, most often after nightmare about traumatic event which then results in panic, insomnia, and severe headache/migraine which interfere with her ability to work.  ?She reports that she would be willing to re-try Viibryd and that [facility] seemed to have the lowest price. Will send script again to [facility]. Requested that she let office know if she has difficulty getting it filled.  ?Will start Viibryd 10 mg 1/2 tab daily with breakfast for one week, then increase to 1 tablet daily with breakfast.  ?Continue Alprazolam 1 mg 1/2-1 tab po BID prn anxiety.  ?Continue Ambien 5 mg po QHS for insomnia.  ?(Wellbutrin XL 450 mg po qd prescribed by PCP).  ?Pt to follow-up in 6 weeks or sooner if clinically indicated. ?Patient advised to contact office with any questions, adverse effects, or acute worsening in signs and symptoms. ? ? ?Amera was seen today for anxiety, depression and insomnia. ? ?Diagnoses and all orders  for this visit: ? ?GAD (generalized anxiety disorder) ?-     Discontinue: Vilazodone HCl (VIIBRYD) 10 MG TABS; Take 1/2 tablet in the morning with breakfast for 7-10 days, then increase to 1 tablet in

## 2021-09-26 ENCOUNTER — Encounter: Payer: Self-pay | Admitting: Psychiatry

## 2021-10-03 ENCOUNTER — Telehealth: Payer: Self-pay | Admitting: Psychiatry

## 2021-10-03 NOTE — Telephone Encounter (Signed)
Victoria Floyd checking status of FMLA papers.  [employer] sent her a notice indicating they had denied 06/11/21.  They also indicated they had not received the paperwork yet.  She wanted to be sure we got it faxed in since they are already trying to deny it. ?

## 2021-10-03 NOTE — Telephone Encounter (Signed)
Thanks let me check on hers and make sure its faxed.  ?

## 2021-10-04 DIAGNOSIS — Z0289 Encounter for other administrative examinations: Secondary | ICD-10-CM

## 2021-10-04 NOTE — Telephone Encounter (Signed)
Paper work completed and given to [place] for review but noted it says to return by 10/14/21  ?

## 2021-10-08 ENCOUNTER — Ambulatory Visit (INDEPENDENT_AMBULATORY_CARE_PROVIDER_SITE_OTHER): Payer: 59 | Admitting: Psychiatry

## 2021-10-08 DIAGNOSIS — F4310 Post-traumatic stress disorder, unspecified: Secondary | ICD-10-CM | POA: Diagnosis not present

## 2021-10-08 NOTE — Progress Notes (Signed)
?      [facility] Counselor/Therapist Progress Note ? ?Patient ID: Victoria Floyd, MRN: [id number],   ? ?Date: 10/08/2021 ? ?Time Spent: 51 minutes start time 3:04 PM end time 3:55 PM ? ?Treatment Type: Individual Therapy ? ?Reported Symptoms: anxiety, sadness, migraines, fatigue,focusing issues, difficulty functioning ? ?Mental Status Exam: ? ?Appearance:   Casual and Neat     ?Behavior:  Appropriate  ?Motor:  Normal  ?Speech/Language:   Normal Rate  ?Affect:  Appropriate  ?Mood:  depressed  ?Thought process:  normal  ?Thought content:    WNL  ?Sensory/Perceptual disturbances:    WNL  ?Orientation:  oriented to person, place, time/date, and situation  ?Attention:  Good  ?Concentration:  Good  ?Memory:  WNL  ?Fund of knowledge:   Good  ?Insight:    Good  ?Judgment:   Good  ?Impulse Control:  Good  ? ?Risk Assessment: ?Danger to Self:  No ?Self-injurious Behavior: No ?Danger to Others: No ?Duty to Warn:no ?Physical Aggression / Violence:No  ?Access to Firearms a concern: No  ?Gang Involvement:No  ? ?Subjective: Patient was present for session.  She shared it is a hard day due to if being her [place] and her daughter was the 1 who made her Birthday special.  She also continues to have issues with work and is trying to get her paperwork taken care of for work.  Patient stated it was her birthday and that made today very difficult because her daughter was typically the one that did so much for her birthday.  Patient shared that her son had celebrated with her and that was good but it was still difficult not having her daughter present.  Patient did processing set on her birthday, suds level 10, negative cognition "I am all alone" felt sadness in her stomach.  Patient was able to reduce suds level to 4.  Discussed the fact that processing would be continuing between sessions and ways for her to manage emotions appropriately.  Patient was able to develop some plans to keep herself engaged in positive activity during the  rest of the evening. ? ?Interventions: Solution-Oriented/Positive Psychology, Eye Movement Desensitization and Reprocessing (EMDR), and Insight-Oriented ? ?Diagnosis: ?  ICD-10-CM   ?1. PTSD (post-traumatic stress disorder)  F43.10   ?  ? ? ?Plan: Patient is to practice coping skills discussed in session including grounding and 5, counting backwards from 100 by twos or threes, and brain spotting exercise vergence.  Patient is to follow plans from session to keep her self engaged in positive activity the rest of the evening.  The patient is to take medication as directed.  Patient is to try doing a brain dump sort of journaling prior to sleep. ?Long-term goal: Develop and implement effective coping skills to carry out normal responsibilities and participate constructively in relationships ?Short-term goal: Sleep without being disturbed by dreams of trauma: Verbalize hopeful and positive statements regarding the future ? ?Stevphen Meuse, [facility] ? ? ? ? ? ? ? ? ? ? ? ? ? ? ? ? ? ? ?

## 2021-10-18 ENCOUNTER — Other Ambulatory Visit: Payer: Self-pay | Admitting: Psychiatry

## 2021-10-18 ENCOUNTER — Telehealth: Payer: Self-pay

## 2021-10-18 DIAGNOSIS — M5412 Radiculopathy, cervical region: Secondary | ICD-10-CM

## 2021-10-18 DIAGNOSIS — G4700 Insomnia, unspecified: Secondary | ICD-10-CM

## 2021-10-18 DIAGNOSIS — F411 Generalized anxiety disorder: Secondary | ICD-10-CM

## 2021-10-18 MED ORDER — GABAPENTIN 300 MG PO CAPS: ORAL_CAPSULE | ORAL | 0 refills | Status: DC

## 2021-10-18 NOTE — Telephone Encounter (Signed)
Refill request received from pharmacy for Gabapentin 300 mg capsule take 2 capsules twice a day. Medication filled for 30 day supply and patient scheduled for follow up appointment June 9,2023.

## 2021-10-22 ENCOUNTER — Ambulatory Visit (INDEPENDENT_AMBULATORY_CARE_PROVIDER_SITE_OTHER): Payer: 59 | Admitting: Psychiatry

## 2021-10-22 DIAGNOSIS — F4310 Post-traumatic stress disorder, unspecified: Secondary | ICD-10-CM | POA: Diagnosis not present

## 2021-10-22 NOTE — Progress Notes (Signed)
      [facility] Counselor/Therapist Progress Note  Patient ID: Victoria Floyd, MRN: [id number],    Date: 10/22/2021  Time Spent: 57 minutes start time 3:03 PM end time 4:00  PM  Treatment Type: Individual Therapy  Reported Symptoms: crying spells, sadness, anxiety, triggered responses, flashbacks  Mental Status Exam:  Appearance:   Well Groomed     Behavior:  Appropriate  Motor:  Normal  Speech/Language:   Normal Rate  Affect:  Appropriate  Mood:  sad  Thought process:  normal  Thought content:    WNL  Sensory/Perceptual disturbances:    WNL  Orientation:  oriented to person, place, time/date, and situation  Attention:  Good  Concentration:  Good  Memory:  WNL  Fund of knowledge:   Good  Insight:    Good  Judgment:   Good  Impulse Control:  Good   Risk Assessment: Danger to Self:  No Self-injurious Behavior: No Danger to Others: No Duty to Warn:no Physical Aggression / Violence:No  Access to Firearms a concern: No  Gang Involvement:No   Subjective: Patient was present for session. She shared it was a bad weekend and she had lots of crying spells.  She wasn't able to get her Viibryd she decided to wait and talk with her provider on 11/08/2021 about the situation.  Patient worked on issue of daughter being gone in session, suds level 10, negative cognition "I should have stayed on the phone with her" felt guilt in her stomach.  Patient was able to reduce suds level to 5.  She was able to recognize that she had been on the phone with her for 10 hours and she was at a good place when she got off the phone.  She was also able to recognize that she acted out of the moment but that was more for her girlfriend than anything else.  Patient also acknowledged she has to start trying to do some things and moving forward even though it is very difficult.  Patient admitted that she had pushed most of her friends away.  She was encouraged to try to reach out and start taking things 1 step  at a time to bring people back into her life.  Interventions: Solution-Oriented/Positive Psychology, Eye Movement Desensitization and Reprocessing (EMDR), and Insight-Oriented  Diagnosis:   ICD-10-CM   1. PTSD (post-traumatic stress disorder)  F43.10       Plan: Patient is to practice coping skills discussed in session including grounding and 5, counting backwards from 100 by twos or threes, and brain spotting exercise vergence.  Patient is to follow plans from session to start trying to reach out to friends and bringing people back into her life.  The patient is to take medication as directed.  Patient is to try doing a brain dump sort of journaling prior to sleep. Long-term goal: Develop and implement effective coping skills to carry out normal responsibilities and participate constructively in relationships Short-term goal: Sleep without being disturbed by dreams of trauma: Verbalize hopeful and positive statements regarding the future    Stevphen Meuse, [facility]

## 2021-11-01 ENCOUNTER — Encounter: Payer: Self-pay | Admitting: Nurse Practitioner

## 2021-11-01 ENCOUNTER — Encounter: Payer: Managed Care, Other (non HMO) | Admitting: Nurse Practitioner

## 2021-11-04 NOTE — Progress Notes (Signed)
Err

## 2021-11-05 ENCOUNTER — Ambulatory Visit (INDEPENDENT_AMBULATORY_CARE_PROVIDER_SITE_OTHER): Payer: 59 | Admitting: Psychiatry

## 2021-11-05 ENCOUNTER — Encounter: Payer: Self-pay | Admitting: Psychiatry

## 2021-11-05 DIAGNOSIS — F4310 Post-traumatic stress disorder, unspecified: Secondary | ICD-10-CM

## 2021-11-05 DIAGNOSIS — F411 Generalized anxiety disorder: Secondary | ICD-10-CM

## 2021-11-05 DIAGNOSIS — F331 Major depressive disorder, recurrent, moderate: Secondary | ICD-10-CM

## 2021-11-05 DIAGNOSIS — G4700 Insomnia, unspecified: Secondary | ICD-10-CM | POA: Diagnosis not present

## 2021-11-05 MED ORDER — DULOXETINE HCL 30 MG PO CPEP: 30.0000 mg | ORAL_CAPSULE | Freq: Every day | ORAL | 1 refills | Status: DC

## 2021-11-05 MED ORDER — ZOLPIDEM TARTRATE 5 MG PO TABS: 5.0000 mg | ORAL_TABLET | Freq: Every day | ORAL | 1 refills | Status: DC

## 2021-11-05 MED ORDER — ALPRAZOLAM 1 MG PO TABS: ORAL_TABLET | ORAL | 1 refills | Status: DC

## 2021-11-05 NOTE — Progress Notes (Signed)
[name] [id number] 1965/07/02 56 y.o.  Subjective:   Patient ID:  Victoria Floyd is a 56 y.o. (DOB January 25, 1966) female.  Chief Complaint:  Chief Complaint  Patient presents with   Depression   Anxiety    Depression        Past medical history includes anxiety.   Anxiety     [name] presents to the office today for follow-up of anxiety, depression, and insomnia.   She reports that she is not having nightmares. Sleep has improved. Sleeping about 5 hours a night.   She reports that she was able to cry over the weekend and this seemed to be a helpful release. She continues to have intrusive memories. "I haven't been in my head as much these last few weeks." Denies any recent flashbacks. She reports that she has had less anxiety and worry overall. She has started planting flowers and plans again. She reports that she had a productive weekend. Reports energy and motivation have improved. She reports that her mood has been better over the last few weeks. Concentration has been ok. Appetite has been good. She reports that she has gained 30-35 lbs in the last 4 years. Reports that she used to enjoy walking. Denies SI.   She reports that she remains somewhat withdrawn socially.   She now has FMLA implemented and had to take a day of FMLA due to headache and increased anxiety.   Past Psychiatric Medication Trials: Ambien- Able to fall asleep. Sometimes still had dreams. Took for about 20 years. Xanax- Prescribed two 1 mg tabs for about 20 years. Effexor- Sleepy Zoloft- Has increased anxiety  Prozac Lexapro Wellbutrin XL- Reports that this has been helpful for her mood and hot flashes. Gabapentin-Prescribed for pain. Trazodone-Increased anxiety, nausea  PHQ2-9    Flowsheet Row Office Visit from 06/22/2018 in [facility] and Adult [facility] Office Visit from 04/07/2017 in [facility] Office Visit from 04/04/2016 in [facility]  and Adult Medicine  PHQ-2 Total Score 0 0 0        Review of Systems:  Review of Systems  Gastrointestinal: Negative.   Musculoskeletal:  Positive for arthralgias. Negative for gait problem.  Neurological:  Negative for tremors.  Psychiatric/Behavioral:  Positive for depression.        Please refer to HPI    Medications: I have reviewed the patient's current medications.  Current Outpatient Medications  Medication Sig Dispense Refill   atorvastatin (LIPITOR) 10 MG tablet TAKE 1 TABLET BY MOUTH ONCE DAILY 90 tablet 1   Biotin 1 MG CAPS Take by mouth.     buPROPion (WELLBUTRIN XL) 150 MG 24 hr tablet TAKE 1 TABLET BY MOUTH ONCE DAILY IN THEEVENING 90 tablet 1   buPROPion (WELLBUTRIN XL) 300 MG 24 hr tablet TAKE 1 TABLET BY MOUTH ONCE DAILY IN THEMORNING 90 tablet 3   DULoxetine (CYMBALTA) 30 MG capsule Take 1 capsule (30 mg total) by mouth daily. 30 capsule 1   fluticasone (FLONASE) 50 MCG/ACT nasal spray Place 2 sprays into both nostrils daily as needed for allergies or rhinitis. 16 g 11   gabapentin (NEURONTIN) 300 MG capsule TAKE 2 CAPSULES BY MOUTH 2 TIMES DAILY. 120 capsule 0   meloxicam (MOBIC) 7.5 MG tablet TAKE 1 TABLET BY MOUTH DAILY 30 tablet 5   methocarbamol (ROBAXIN) 500 MG tablet TAKE 2 TABLETS BY MOUTH TWICE DAILY FOR MUSCLE SPAMS 120 tablet 1   metoprolol tartrate (LOPRESSOR) 100  MG tablet TAKE 2 TABLETS BY MOUTH DAILY 60 tablet 5   Multiple Vitamin (MULTIVITAMIN) tablet Take 1 tablet by mouth daily.     [START ON 11/16/2021] ALPRAZolam (XANAX) 1 MG tablet TAKE 1/2 TO 1 TABLET BY MOUTH TWICE DAILY AS NEEDED 45 tablet 1   [START ON 11/16/2021] zolpidem (AMBIEN) 5 MG tablet Take 1 tablet (5 mg total) by mouth at bedtime. 30 tablet 1   No current facility-administered medications for this visit.    Medication Side Effects: None  Allergies:  Allergies  Allergen Reactions   Codeine Itching    Past Medical History:  Diagnosis Date   Allergy    Anxiety    Chronic  constipation    Hypertension    hx of   Insomnia     Past Medical History, Surgical history, Social history, and Family history were reviewed and updated as appropriate.   Please see review of systems for further details on the patient's review from today.   Objective:   Physical Exam:  LMP 07/12/2014 Comment: Tubal Ligation and Neg Preg test  Physical Exam Constitutional:      General: She is not in acute distress. Musculoskeletal:        General: No deformity.  Neurological:     Mental Status: She is alert and oriented to person, place, and time.     Coordination: Coordination normal.  Psychiatric:        Attention and Perception: Attention and perception normal. She does not perceive auditory or visual hallucinations.        Mood and Affect: Affect is not labile, blunt, angry or inappropriate.        Speech: Speech normal.        Behavior: Behavior normal.        Thought Content: Thought content normal. Thought content is not paranoid or delusional. Thought content does not include homicidal or suicidal ideation. Thought content does not include homicidal or suicidal plan.        Cognition and Memory: Cognition and memory normal.        Judgment: Judgment normal.     Comments: Insight intact Mood presents as less anxious and less depressed compared to previous exams     Lab Review:     Component Value Date/Time   NA 140 04/02/2021 1525   K 3.7 04/02/2021 1525   CL 106 04/02/2021 1525   CO2 25 04/02/2021 1525   GLUCOSE 83 04/02/2021 1525   BUN 16 04/02/2021 1525   CREATININE 1.06 (H) 04/02/2021 1525   CALCIUM 9.6 04/02/2021 1525   PROT 7.0 04/02/2021 1525   ALBUMIN 4.4 11/21/2016 1510   AST 20 04/02/2021 1525   ALT 17 04/02/2021 1525   ALKPHOS 73 11/21/2016 1510   BILITOT 0.4 04/02/2021 1525   GFRNONAA 58 (L) 11/30/2020 1446   GFRAA 68 11/30/2020 1446       Component Value Date/Time   WBC 4.3 04/02/2021 1525   RBC 3.87 04/02/2021 1525   HGB 12.2  04/02/2021 1525   HCT 36.7 04/02/2021 1525   PLT 230 04/02/2021 1525   MCV 94.8 04/02/2021 1525   MCH 31.5 04/02/2021 1525   MCHC 33.2 04/02/2021 1525   RDW 11.9 04/02/2021 1525   LYMPHSABS 1,905 04/02/2021 1525   MONOABS 424 11/21/2016 1510   EOSABS 138 04/02/2021 1525   BASOSABS 22 04/02/2021 1525    No results found for: "POCLITH", "LITHIUM"   No results found for: "PHENYTOIN", "PHENOBARB", "VALPROATE", "CBMZ"   .  res Assessment: Plan:   Pt seen for 30 minutes and time spent discussing other treatment options since she reports that she has not been able to obtain Vilazodone at a reasonable cost. Discussed potential benefits, risks, and side effects of Cymbalta. Pt agrees to trial of low dose Cymbalta. Will start Cymbalta 30 mg po q am for depression and anxiety.  Recommended moving administration time of Wellbutrin XL to all in the morning to minimize sleep disturbance. Continue Xanax 1 mg 1/2-1 tab po BID prn anxiety.  Continue Ambien 5 mg po QHS for insomnia.  Recommend continuing therapy with Stevphen Meuse, [facility].  Pt to follow-up with this provider in 4 weeks or sooner if clinically indicated.  Patient advised to contact office with any questions, adverse effects, or acute worsening in signs and symptoms.   Shian was seen today for depression and anxiety.  Diagnoses and all orders for this visit:  PTSD (post-traumatic stress disorder) -     DULoxetine (CYMBALTA) 30 MG capsule; Take 1 capsule (30 mg total) by mouth daily.  Insomnia, unspecified type -     zolpidem (AMBIEN) 5 MG tablet; Take 1 tablet (5 mg total) by mouth at bedtime.  GAD (generalized anxiety disorder) -     DULoxetine (CYMBALTA) 30 MG capsule; Take 1 capsule (30 mg total) by mouth daily. -     ALPRAZolam (XANAX) 1 MG tablet; TAKE 1/2 TO 1 TABLET BY MOUTH TWICE DAILY AS NEEDED  Major depressive disorder, recurrent episode, moderate (HCC) -     DULoxetine (CYMBALTA) 30 MG capsule; Take 1 capsule (30 mg  total) by mouth daily.     Please see After Visit Summary for patient specific instructions.  Future Appointments  Date Time Provider Department Center  11/27/2021  4:00 PM Stevphen Meuse, [facility] CP-CP None  12/05/2021  3:30 PM Corie Chiquito, PMHNP CP-CP None  12/10/2021  4:00 PM Stevphen Meuse, [facility] CP-CP None  12/26/2021  4:00 PM Stevphen Meuse, [facility] CP-CP None  01/13/2022  3:00 PM Stevphen Meuse, [facility] CP-CP None    No orders of the defined types were placed in this encounter.   -------------------------------

## 2021-11-05 NOTE — Progress Notes (Signed)
      [facility] Counselor/Therapist Progress Note  Patient ID: Victoria Floyd, MRN: [id number],    Date: 11/05/2021  Time Spent: 35 minutes start time 3:25 PM end time 4 PM  Treatment Type: Individual Therapy  Reported Symptoms: sadness, crying spells, flashbacks, anxiety  Mental Status Exam:  Appearance:   Casual and Neat     Behavior:  Appropriate  Motor:  Normal  Speech/Language:   Normal Rate  Affect:  Appropriate  Mood:  normal  Thought process:  normal  Thought content:    WNL  Sensory/Perceptual disturbances:    WNL  Orientation:  oriented to person, place, time/date, and situation  Attention:  Good  Concentration:  Good  Memory:  WNL  Fund of knowledge:   Good  Insight:    Good  Judgment:   Good  Impulse Control:  Good   Risk Assessment: Danger to Self:  No Self-injurious Behavior: No Danger to Others: No Duty to Warn:no Physical Aggression / Violence:No  Access to Firearms a concern: No  Gang Involvement:No   Subjective: Patient was present for session.  She shared she is doing better since last session.  She reported that she had a good cry   but she felt she caused it herself due to looking at pictures.  She shared using her coping skills and they have helped her. She stated that she was able to get down stairs after not being able to go down there for 4 years. She stated she had let all the plants die so she went and got more plants and planted them. She also connected with her friend and they got the pool opened.  Patient explained she is working a lot and that is keeping her mind engaged in something positive.  She is realizing she needs to start reconnecting with friends and hopes to once the pool is completely opened.  Patient was encouraged to feel positive about all the progress that she is made.  Discussed the fact that work still needs to be done on her grief and her trauma but she is at least moving forward which is huge progress.  The importance of  continuing to work on her skills to help the progress continue were discussed with patient.  Interventions: Solution-Oriented/Positive Psychology  Diagnosis:   ICD-10-CM   1. PTSD (post-traumatic stress disorder)  F43.10       Plan: Patient is to practice coping skills discussed in session including grounding and 5, counting backwards from 100 by twos or threes, and brain spotting exercise vergence.  Patient is to follow plans from session to start trying to reach out to friends and bringing people back into her life.  The patient is to take medication as directed.  Patient is to try doing a brain dump sort of journaling prior to sleep. Long-term goal: Develop and implement effective coping skills to carry out normal responsibilities and participate constructively in relationships Short-term goal: Sleep without being disturbed by dreams of trauma: Verbalize hopeful and positive statements regarding the future  Lina Sayre, [facility]

## 2021-11-25 ENCOUNTER — Other Ambulatory Visit: Payer: Self-pay

## 2021-11-25 DIAGNOSIS — F32A Depression, unspecified: Secondary | ICD-10-CM

## 2021-11-25 MED ORDER — BUPROPION HCL ER (XL) 300 MG PO TB24: ORAL_TABLET | ORAL | 1 refills | Status: DC

## 2021-11-25 NOTE — Telephone Encounter (Signed)
[facility] send a fax for Wellbutrin 300 MG for patient.

## 2021-11-27 ENCOUNTER — Ambulatory Visit (INDEPENDENT_AMBULATORY_CARE_PROVIDER_SITE_OTHER): Payer: 59 | Admitting: Psychiatry

## 2021-11-27 ENCOUNTER — Other Ambulatory Visit: Payer: Self-pay

## 2021-11-27 DIAGNOSIS — F32A Depression, unspecified: Secondary | ICD-10-CM

## 2021-11-27 DIAGNOSIS — E782 Mixed hyperlipidemia: Secondary | ICD-10-CM

## 2021-11-27 DIAGNOSIS — F4310 Post-traumatic stress disorder, unspecified: Secondary | ICD-10-CM

## 2021-11-27 DIAGNOSIS — M5412 Radiculopathy, cervical region: Secondary | ICD-10-CM

## 2021-11-27 NOTE — Progress Notes (Signed)
      [facility] Counselor/Therapist Progress Note  Patient ID: Victoria Floyd, MRN: [id number],    Date: 11/27/2021  Time Spent: 50 minutes start time 4:09 end time 4:59 PM  Treatment Type: Individual Therapy  Reported Symptoms: grief issues, sadness, flashbacks, triggered responses  Mental Status Exam:  Appearance:   Casual and Neat     Behavior:  Appropriate  Motor:  Normal  Speech/Language:   Normal Rate  Affect:  Appropriate  Mood:  anxious  Thought process:  normal  Thought content:    WNL  Sensory/Perceptual disturbances:    WNL  Orientation:  oriented to person, place, time/date, and situation  Attention:  Good  Concentration:  Good  Memory:  WNL  Fund of knowledge:   Good  Insight:    Good  Judgment:   Good  Impulse Control:  Good   Risk Assessment: Danger to Self:  No Self-injurious Behavior: No Danger to Others: No Duty to Warn:no Physical Aggression / Violence:No  Access to Firearms a concern: No  Gang Involvement:No   Subjective: Patient was present for session.  She shared her nightmares are better and sleep ing better.  Patient reported that a friend of her son's father hung himself and that has been hard for her. She shared prior to that she was feeling much better on the new medication.  Did processing set on hearing news about the suicide, suds level 6, negative cognition "I am broken hearted" felt sadness in her stomach.  Patient was able to reduce suds level to 2.  She reported feeling better at the end of session.  Through the processing she was able to realize that she had done everything she could for her daughter and that was very helpful for her.  Discussed the importance of recognizing how her mood changes or does not change over the next week so she can discuss that with her provider Corie Chiquito, PMHNP when she meets with her on December 05, 2021.  Interventions: Eye Movement Desensitization and Reprocessing (EMDR) and  Insight-Oriented  Diagnosis:   ICD-10-CM   1. PTSD (post-traumatic stress disorder)  F43.10       Plan: Patient is to practice coping skills discussed in session including grounding and 5, counting backwards from 100 by twos or threes, and brain spotting exercise vergence.  Patient is to follow plans from session to monitor her mood and discuss it further with her provider Corie Chiquito p.m. HNP at their next appointment.  The patient is to take medication as directed.  Patient is to try doing a brain dump sort of journaling prior to sleep. Long-term goal: Develop and implement effective coping skills to carry out normal responsibilities and participate constructively in relationships Short-term goal: Sleep without being disturbed by dreams of trauma: Verbalize hopeful and positive statements regarding the future  Stevphen Meuse, [facility]

## 2021-11-28 ENCOUNTER — Other Ambulatory Visit: Payer: Self-pay

## 2021-11-28 ENCOUNTER — Telehealth: Payer: Self-pay

## 2021-11-28 DIAGNOSIS — I10 Essential (primary) hypertension: Secondary | ICD-10-CM

## 2021-11-28 DIAGNOSIS — M5412 Radiculopathy, cervical region: Secondary | ICD-10-CM

## 2021-11-28 MED ORDER — GABAPENTIN 300 MG PO CAPS: ORAL_CAPSULE | ORAL | 0 refills | Status: DC

## 2021-11-28 MED ORDER — METOPROLOL TARTRATE 100 MG PO TABS: 200.0000 mg | ORAL_TABLET | Freq: Every day | ORAL | 5 refills | Status: DC

## 2021-11-28 MED ORDER — BUPROPION HCL ER (XL) 300 MG PO TB24: ORAL_TABLET | ORAL | 0 refills | Status: DC

## 2021-11-28 MED ORDER — ATORVASTATIN CALCIUM 10 MG PO TABS: 10.0000 mg | ORAL_TABLET | Freq: Every day | ORAL | 0 refills | Status: DC

## 2021-11-28 NOTE — Telephone Encounter (Signed)
[facility] request [facility] be removed from her chart and updated to [facility]. Canceled any prescriptions sent to [facility].

## 2021-12-03 ENCOUNTER — Other Ambulatory Visit: Payer: Self-pay | Admitting: *Deleted

## 2021-12-03 DIAGNOSIS — M5412 Radiculopathy, cervical region: Secondary | ICD-10-CM

## 2021-12-03 DIAGNOSIS — F331 Major depressive disorder, recurrent, moderate: Secondary | ICD-10-CM

## 2021-12-03 DIAGNOSIS — F4310 Post-traumatic stress disorder, unspecified: Secondary | ICD-10-CM

## 2021-12-03 DIAGNOSIS — F411 Generalized anxiety disorder: Secondary | ICD-10-CM

## 2021-12-03 MED ORDER — DULOXETINE HCL 30 MG PO CPEP: 30.0000 mg | ORAL_CAPSULE | Freq: Every day | ORAL | 0 refills | Status: DC

## 2021-12-03 MED ORDER — GABAPENTIN 300 MG PO CAPS: ORAL_CAPSULE | ORAL | 0 refills | Status: DC

## 2021-12-03 NOTE — Telephone Encounter (Signed)
Patient requested refills to be sent to [facility]. Wants [facility] taken out of her pharmacies.   Patient needs an appointment before anymore future refills.

## 2021-12-05 ENCOUNTER — Ambulatory Visit: Payer: 59 | Admitting: Psychiatry

## 2021-12-06 ENCOUNTER — Ambulatory Visit (INDEPENDENT_AMBULATORY_CARE_PROVIDER_SITE_OTHER): Payer: 59 | Admitting: Psychiatry

## 2021-12-06 ENCOUNTER — Encounter: Payer: Self-pay | Admitting: Psychiatry

## 2021-12-06 DIAGNOSIS — F4310 Post-traumatic stress disorder, unspecified: Secondary | ICD-10-CM

## 2021-12-06 DIAGNOSIS — G4700 Insomnia, unspecified: Secondary | ICD-10-CM | POA: Diagnosis not present

## 2021-12-06 DIAGNOSIS — F32A Depression, unspecified: Secondary | ICD-10-CM | POA: Diagnosis not present

## 2021-12-06 DIAGNOSIS — F411 Generalized anxiety disorder: Secondary | ICD-10-CM | POA: Diagnosis not present

## 2021-12-06 MED ORDER — ALPRAZOLAM 1 MG PO TABS: ORAL_TABLET | ORAL | 2 refills | Status: DC

## 2021-12-06 MED ORDER — BUPROPION HCL ER (XL) 300 MG PO TB24: 300.0000 mg | ORAL_TABLET | Freq: Every day | ORAL | 1 refills | Status: DC

## 2021-12-06 MED ORDER — ZOLPIDEM TARTRATE 5 MG PO TABS: 5.0000 mg | ORAL_TABLET | Freq: Every day | ORAL | 2 refills | Status: DC

## 2021-12-06 MED ORDER — BUPROPION HCL ER (XL) 150 MG PO TB24: 150.0000 mg | ORAL_TABLET | Freq: Every day | ORAL | 1 refills | Status: DC

## 2021-12-06 NOTE — Progress Notes (Signed)
Victoria Floyd [id number] July 16, 1965 56 y.o.  Subjective:   Patient ID:  Victoria Floyd is a 56 y.o. (DOB 1966/02/10) female.  Chief Complaint: No chief complaint on file.   HPI Victoria Floyd presents to the office today for follow-up of ***  She reports that first week she took Cymbalta she was very tired and missed a week of work and "by the second week it started getting better" and she started doing things around the house. She reports that her energy, mood, and motivation were improved. She reports that that she started to have tremor and trembling, which interferes with her fine motor movement required for her work. She did not take Cymbalta yesterday or today and reports that shaking has improved but not resolved. She is visibly shaky on exam in her hands and lower extremities. She reports that the shaking and trembling has caused her some anxiety. A friend lost her dad to suicide and she had some increased PTSD symptoms around this time. She has not had any nightmares recently. Denies any recent intrusive memories. Denies any depressed mood over the past week. Sleeping well. Appetite has been decreased. Energy and motivation have been ok. She reports that her concentration has been slightly diminished due to medication side effects. Denies SI.   She is back to working 12 hour shifts. She works five 12-hour days and one 10-hour shifts.   Past Psychiatric Medication Trials: Ambien- Able to fall asleep. Sometimes still had dreams. Took for about 20 years. Xanax- Prescribed two 1 mg tabs for about 20 years. Effexor- Sleepy Zoloft- Has increased anxiety  Prozac Lexapro Cymbalta- initially seemed to be helpful and then developed trembling Wellbutrin XL- Reports that this has been helpful for her mood and hot flashes. Gabapentin-Prescribed for pain. Trazodone-Increased anxiety, nausea  PHQ2-9    Flowsheet Row Office Visit from 06/22/2018 in [facility] and Adult Medicine  Office Visit from 04/07/2017 in [facility] and Adult Medicine Office Visit from 04/04/2016 in [facility] and Adult Medicine  PHQ-2 Total Score 0 0 0        Review of Systems:  Review of Systems  Musculoskeletal:  Negative for gait problem.  Neurological:  Positive for tremors.  Psychiatric/Behavioral:         Please refer to HPI    Medications: I have reviewed the patient's current medications.  Current Outpatient Medications  Medication Sig Dispense Refill   ALPRAZolam (XANAX) 1 MG tablet TAKE 1/2 TO 1 TABLET BY MOUTH TWICE DAILY AS NEEDED 45 tablet 1   atorvastatin (LIPITOR) 10 MG tablet Take 1 tablet (10 mg total) by mouth daily. 30 tablet 0   Biotin 1 MG CAPS Take by mouth.     buPROPion (WELLBUTRIN XL) 150 MG 24 hr tablet TAKE 1 TABLET BY MOUTH ONCE DAILY IN THEEVENING 90 tablet 1   buPROPion (WELLBUTRIN XL) 300 MG 24 hr tablet TAKE 1 TABLET BY MOUTH ONCE DAILY IN THEMORNING 30 tablet 0   DULoxetine (CYMBALTA) 30 MG capsule Take 1 capsule (30 mg total) by mouth daily. Needs an appointment before anymore future refills. 30 capsule 0   fluticasone (FLONASE) 50 MCG/ACT nasal spray Place 2 sprays into both nostrils daily as needed for allergies or rhinitis. 16 g 11   gabapentin (NEURONTIN) 300 MG capsule Take Two Capsules by mouth twice daily. Needs an appointment before anymore future refills. 120 capsule 0   meloxicam (MOBIC) 7.5 MG tablet TAKE 1 TABLET BY MOUTH DAILY  30 tablet 5   methocarbamol (ROBAXIN) 500 MG tablet TAKE 2 TABLETS BY MOUTH TWICE DAILY FOR MUSCLE SPAMS 120 tablet 1   metoprolol tartrate (LOPRESSOR) 100 MG tablet Take 2 tablets (200 mg total) by mouth daily. 60 tablet 5   Multiple Vitamin (MULTIVITAMIN) tablet Take 1 tablet by mouth daily.     zolpidem (AMBIEN) 5 MG tablet Take 1 tablet (5 mg total) by mouth at bedtime. 30 tablet 1   No current facility-administered medications for this visit.    Medication Side Effects: Other:  Trembling/tremors  Allergies:  Allergies  Allergen Reactions   Codeine Itching    Past Medical History:  Diagnosis Date   Allergy    Anxiety    Chronic constipation    Hypertension    hx of   Insomnia     Past Medical History, Surgical history, Social history, and Family history were reviewed and updated as appropriate.   Please see review of systems for further details on the patient's review from today.   Objective:   Physical Exam:  LMP 07/12/2014 Comment: Tubal Ligation and Neg Preg test  Physical Exam  Lab Review:     Component Value Date/Time   NA 140 04/02/2021 1525   K 3.7 04/02/2021 1525   CL 106 04/02/2021 1525   CO2 25 04/02/2021 1525   GLUCOSE 83 04/02/2021 1525   BUN 16 04/02/2021 1525   CREATININE 1.06 (H) 04/02/2021 1525   CALCIUM 9.6 04/02/2021 1525   PROT 7.0 04/02/2021 1525   ALBUMIN 4.4 11/21/2016 1510   AST 20 04/02/2021 1525   ALT 17 04/02/2021 1525   ALKPHOS 73 11/21/2016 1510   BILITOT 0.4 04/02/2021 1525   GFRNONAA 58 (L) 11/30/2020 1446   GFRAA 68 11/30/2020 1446       Component Value Date/Time   WBC 4.3 04/02/2021 1525   RBC 3.87 04/02/2021 1525   HGB 12.2 04/02/2021 1525   HCT 36.7 04/02/2021 1525   PLT 230 04/02/2021 1525   MCV 94.8 04/02/2021 1525   MCH 31.5 04/02/2021 1525   MCHC 33.2 04/02/2021 1525   RDW 11.9 04/02/2021 1525   LYMPHSABS 1,905 04/02/2021 1525   MONOABS 424 11/21/2016 1510   EOSABS 138 04/02/2021 1525   BASOSABS 22 04/02/2021 1525    No results found for: "POCLITH", "LITHIUM"   No results found for: "PHENYTOIN", "PHENOBARB", "VALPROATE", "CBMZ"   .res Assessment: Plan:   Discussed moving Wellbutrin XL to morning.  There are no diagnoses linked to this encounter.   Please see After Visit Summary for patient specific instructions.  Future Appointments  Date Time Provider Department Center  12/06/2021 12:45 PM Corie Chiquito, PMHNP [facility] None  12/09/2021  9:40 AM [name], [name], NP [facility]  None  12/10/2021  4:00 PM Stevphen Meuse, [facility] None  12/26/2021  4:00 PM Stevphen Meuse, [facility] CP-[facility] None  01/13/2022  3:00 PM Stevphen Meuse, [facility] CP-[facility] None  02/05/2022  4:00 PM Stevphen Meuse, [facility] CP-CP None    No orders of the defined types were placed in this encounter.   -------------------------------

## 2021-12-09 ENCOUNTER — Encounter: Payer: Self-pay | Admitting: Family

## 2021-12-09 ENCOUNTER — Ambulatory Visit (INDEPENDENT_AMBULATORY_CARE_PROVIDER_SITE_OTHER): Payer: Managed Care, Other (non HMO) | Admitting: Family

## 2021-12-09 ENCOUNTER — Ambulatory Visit: Payer: Managed Care, Other (non HMO) | Admitting: Family

## 2021-12-09 VITALS — BP 128/80 | HR 85 | Temp 97.7°F | Resp 18 | Ht 60.0 in | Wt 144.0 lb

## 2021-12-09 DIAGNOSIS — M159 Polyosteoarthritis, unspecified: Secondary | ICD-10-CM

## 2021-12-09 DIAGNOSIS — F331 Major depressive disorder, recurrent, moderate: Secondary | ICD-10-CM

## 2021-12-09 DIAGNOSIS — M5412 Radiculopathy, cervical region: Secondary | ICD-10-CM

## 2021-12-09 DIAGNOSIS — I10 Essential (primary) hypertension: Secondary | ICD-10-CM

## 2021-12-09 DIAGNOSIS — F32A Depression, unspecified: Secondary | ICD-10-CM

## 2021-12-09 DIAGNOSIS — F411 Generalized anxiety disorder: Secondary | ICD-10-CM

## 2021-12-09 DIAGNOSIS — E782 Mixed hyperlipidemia: Secondary | ICD-10-CM | POA: Diagnosis not present

## 2021-12-09 DIAGNOSIS — M150 Primary generalized (osteo)arthritis: Secondary | ICD-10-CM

## 2021-12-09 MED ORDER — BUPROPION HCL ER (XL) 150 MG PO TB24: 150.0000 mg | ORAL_TABLET | Freq: Every day | ORAL | 1 refills | Status: DC

## 2021-12-09 MED ORDER — MELOXICAM 7.5 MG PO TABS: 7.5000 mg | ORAL_TABLET | Freq: Every day | ORAL | 5 refills | Status: DC

## 2021-12-09 MED ORDER — METHOCARBAMOL 500 MG PO TABS: ORAL_TABLET | ORAL | 1 refills | Status: DC

## 2021-12-09 MED ORDER — ATORVASTATIN CALCIUM 10 MG PO TABS: 10.0000 mg | ORAL_TABLET | Freq: Every day | ORAL | 1 refills | Status: DC

## 2021-12-09 MED ORDER — BUPROPION HCL ER (XL) 300 MG PO TB24: 300.0000 mg | ORAL_TABLET | Freq: Every day | ORAL | 1 refills | Status: DC

## 2021-12-09 MED ORDER — METOPROLOL TARTRATE 100 MG PO TABS: 200.0000 mg | ORAL_TABLET | Freq: Every day | ORAL | 1 refills | Status: DC

## 2021-12-09 NOTE — Progress Notes (Signed)
Provider: Ainhoa Rallo FNP-C   Lauree Chandler, NP  Patient Care Team: Lauree Chandler, NP as PCP - General (Geriatric Medicine) Hilarie Fredrickson Lajuan Lines, MD as Consulting Physician (Gastroenterology)  Extended Emergency Contact Information Primary Emergency Contact: Worrell,[name] Address: [address]          [place], [place] [address] Johnnette Litter of [place] Phone: [phone] Relation: Mother  Code Status:  Full Code  Goals of care: Advanced Directive information    12/09/2021    8:31 AM  Advanced Directives  Does Patient Have a Medical Advance Directive? No  Would patient like information on creating a medical advance directive? No - Patient declined     Chief Complaint  Patient presents with   Medical Management of Chronic Issues    Patient is here for medication refills. Patient has medication bottles at time of appointment. Patient is also here for a basic health check up    HPI:  Pt is a 56 y.o. female seen today for 6 months follow up for medical management of chronic diseases.  Has medical history of essential hypertension, mixed hyperlipidemia, recurrent major depression disorder, insomnia, generalized anxiety disorder, cervical radiculopathy among other conditions. She request refill on her medication except alprazolam,Gabapentin and Ambien.  She continues to require  Has had 4 lbs wt loss.states eating better.Has been exercising by walking and also has a busy job.recently ordered a stationary foot bike she plans to use   Insomnia - Ambien has been effective has not had any adverse effects.  GAD - Alprazolam effective.    Past Medical History:  Diagnosis Date   Allergy    Anxiety    Chronic constipation    Hypertension    hx of   Insomnia    Past Surgical History:  Procedure Laterality Date   ANTERIOR CERVICAL DECOMP/DISCECTOMY FUSION N/A 07/12/2014   Procedure: ANTERIOR CERVICAL DECOMPRESSION/DISCECTOMY FUSION 2 LEVELS;  Surgeon: Sinclair Ship, MD;  Location: [facility];  Service: Orthopedics;  Laterality: N/A;  Anterior cervical decompression fusion, cervical 5-6, cervical 6-7 with instrumentation, allograft   BUNIONECTOMY Bilateral    CERVICAL DISC SURGERY  07/12/2014   C5 C6 C7    DR [name]   TUBAL LIGATION     WISDOM TOOTH EXTRACTION      Allergies  Allergen Reactions   Codeine Itching    Allergies as of 12/09/2021       Reactions   Codeine Itching        Medication List        Accurate as of December 09, 2021 10:17 AM. If you have any questions, ask your nurse or doctor.          ALPRAZolam 1 MG tablet Commonly known as: XANAX TAKE 1/2 TO 1 TABLET BY MOUTH TWICE DAILY AS NEEDED Start taking on: January 20, 2022   atorvastatin 10 MG tablet Commonly known as: LIPITOR Take 1 tablet (10 mg total) by mouth daily.   Biotin 1 MG Caps Take by mouth.   buPROPion 150 MG 24 hr tablet Commonly known as: WELLBUTRIN XL Take 1 tablet (150 mg total) by mouth daily. TAKE WITH A 300 MG TABLET TO EQUAL TOTAL DOSE OF 450 MG   buPROPion 300 MG 24 hr tablet Commonly known as: WELLBUTRIN XL Take 1 tablet (300 mg total) by mouth daily. TAKE WITH A 150 MG TABLET TO EQUAL TOTAL DOSE OF 450 MG   fluticasone 50 MCG/ACT nasal spray Commonly known as: Lillian  2 sprays into both nostrils daily as needed for allergies or rhinitis.   gabapentin 300 MG capsule Commonly known as: NEURONTIN Take Two Capsules by mouth twice daily. Needs an appointment before anymore future refills.   meloxicam 7.5 MG tablet Commonly known as: MOBIC Take 1 tablet (7.5 mg total) by mouth daily.   methocarbamol 500 MG tablet Commonly known as: ROBAXIN TAKE 2 TABLETS BY MOUTH TWICE DAILY FOR MUSCLE SPAMS   metoprolol tartrate 100 MG tablet Commonly known as: LOPRESSOR Take 2 tablets (200 mg total) by mouth daily.   multivitamin tablet Take 1 tablet by mouth daily.   zolpidem 5 MG tablet Commonly known as: AMBIEN Take 1 tablet  (5 mg total) by mouth at bedtime. Start taking on: January 20, 2022        Review of Systems  Constitutional:  Negative for appetite change, chills, fatigue, fever and unexpected weight change.  HENT:  Negative for congestion, dental problem, ear discharge, ear pain, facial swelling, hearing loss, nosebleeds, postnasal drip, rhinorrhea, sinus pressure, sinus pain, sneezing, sore throat, tinnitus and trouble swallowing.   Eyes:  Negative for pain, discharge, redness, itching and visual disturbance.  Respiratory:  Negative for cough, chest tightness, shortness of breath and wheezing.   Cardiovascular:  Negative for chest pain, palpitations and leg swelling.  Gastrointestinal:  Negative for abdominal distention, abdominal pain, blood in stool, constipation, diarrhea, nausea and vomiting.  Endocrine: Negative for cold intolerance, heat intolerance, polydipsia, polyphagia and polyuria.  Genitourinary:  Negative for difficulty urinating, dysuria, flank pain, frequency and urgency.  Musculoskeletal:  Positive for arthralgias. Negative for back pain, gait problem, joint swelling, myalgias, neck pain and neck stiffness.  Skin:  Negative for color change, pallor, rash and wound.  Neurological:  Negative for dizziness, syncope, speech difficulty, weakness, light-headedness, numbness and headaches.  Hematological:  Does not bruise/bleed easily.  Psychiatric/Behavioral:  Positive for sleep disturbance. Negative for agitation, behavioral problems, confusion, hallucinations, self-injury and suicidal ideas. The patient is nervous/anxious.        Alprazolam and Ambien effective for anxiety sleep     Immunization History  Administered Date(s) Administered   Influenza-Unspecified 02/24/2015   Janssen (J&J) SARS-COV-2 Vaccination 10/22/2019   Tdap 02/24/2015   Pertinent  Health Maintenance Due  Topic Date Due   PAP SMEAR-Modifier  Never done   MAMMOGRAM  06/03/2019   COLONOSCOPY (Pts 45-58yr Insurance  coverage will need to be confirmed)  08/12/2021   INFLUENZA VACCINE  12/24/2021      10/27/2017    3:25 PM 01/01/2018   11:22 AM 10/27/2019    4:00 PM 11/30/2020    2:21 PM 12/09/2021    8:32 AM  Fall Risk  Falls in the past year? No No 0 0 0  Was there an injury with Fall?   0 0 0  Fall Risk Category Calculator   0 0 0  Fall Risk Category   Low Low Low  Patient Fall Risk Level   Low fall risk Low fall risk Low fall risk  Patient at Risk for Falls Due to    No Fall Risks No Fall Risks  Fall risk Follow up    Falls evaluation completed Falls evaluation completed   Functional Status Survey:    Vitals:   12/09/21 0947  BP: 128/80  Pulse: 85  Resp: 18  Temp: 97.7 F (36.5 C)  TempSrc: Temporal  SpO2: 96%  Weight: 144 lb (65.3 kg)  Height: 5' (1.524 m)   Body mass index  is 28.12 kg/m. Physical Exam Vitals reviewed.  Constitutional:      General: She is not in acute distress.    Appearance: Normal appearance. She is overweight. She is not ill-appearing or diaphoretic.  HENT:     Head: Normocephalic.  Neck:     Vascular: No carotid bruit.  Cardiovascular:     Rate and Rhythm: Normal rate and regular rhythm.     Pulses: Normal pulses.     Heart sounds: Normal heart sounds. No murmur heard.    No friction rub. No gallop.  Pulmonary:     Effort: Pulmonary effort is normal. No respiratory distress.     Breath sounds: Normal breath sounds. No wheezing, rhonchi or rales.  Chest:     Chest wall: No tenderness.  Abdominal:     General: Bowel sounds are normal. There is no distension.     Palpations: Abdomen is soft. There is no mass.     Tenderness: There is no abdominal tenderness. There is no right CVA tenderness, left CVA tenderness, guarding or rebound.  Musculoskeletal:        General: No swelling or tenderness. Normal range of motion.     Cervical back: Normal range of motion. No rigidity or tenderness.     Right lower leg: No edema.     Left lower leg: No edema.   Lymphadenopathy:     Cervical: No cervical adenopathy.  Skin:    General: Skin is warm and dry.     Coloration: Skin is not pale.     Findings: No bruising, erythema, lesion or rash.  Neurological:     Mental Status: She is alert and oriented to person, place, and time.     Cranial Nerves: No cranial nerve deficit.     Sensory: No sensory deficit.     Motor: No weakness.     Coordination: Coordination normal.     Gait: Gait normal.  Psychiatric:        Mood and Affect: Mood normal.        Speech: Speech normal.        Behavior: Behavior normal.        Thought Content: Thought content normal.        Judgment: Judgment normal.     Labs reviewed: Recent Labs    04/02/21 1525  NA 140  K 3.7  CL 106  CO2 25  GLUCOSE 83  BUN 16  CREATININE 1.06*  CALCIUM 9.6   Recent Labs    04/02/21 1525  AST 20  ALT 17  BILITOT 0.4  PROT 7.0   Recent Labs    04/02/21 1525  WBC 4.3  NEUTROABS 1,871  HGB 12.2  HCT 36.7  MCV 94.8  PLT 230   Lab Results  Component Value Date   TSH 1.48 01/28/2019   No results found for: "HGBA1C" Lab Results  Component Value Date   CHOL 184 04/02/2021   HDL 68 04/02/2021   LDLCALC 99 04/02/2021   TRIG 79 04/02/2021   CHOLHDL 2.7 04/02/2021    Significant Diagnostic Results in last 30 days:  No results found.  Assessment/Plan  1. Mixed hyperlipidemia No recent LDL for review Continue on atorvastatin - atorvastatin (LIPITOR) 10 MG tablet; Take 1 tablet (10 mg total) by mouth daily.  Dispense: 90 tablet; Refill: 1 - Lipid Panel; Future  2. Primary osteoarthritis involving multiple joints Chronic -Continue on Meloxicam -Exercise encouraged - meloxicam (MOBIC) 7.5 MG tablet; Take 1 tablet (7.5 mg  total) by mouth daily.  Dispense: 30 tablet; Refill: 5  3. Cervical radiculopathy Continue on Robaxin - methocarbamol (ROBAXIN) 500 MG tablet; TAKE 2 TABLETS BY MOUTH TWICE DAILY FOR MUSCLE SPAMS  Dispense: 120 tablet; Refill: 1  4.  Essential hypertension Blood pressure well controlled on current medication - metoprolol tartrate (LOPRESSOR) 100 MG tablet; Take 2 tablets (200 mg total) by mouth daily.  Dispense: 90 tablet; Refill: 1 - CBC with Differential/Platelet; Future - CMP with eGFR([facility]); Future - TSH; Future  5 GAD (generalized anxiety disorder) Alprazolam effective No side effects reported  6. Major depressive disorder, recurrent episode, moderate (HCC) Mood stable on Wellbutrin - buPROPion (WELLBUTRIN XL) 150 MG 24 hr tablet; Take 1 tablet (150 mg total) by mouth daily. TAKE WITH A 300 MG TABLET TO EQUAL TOTAL DOSE OF 450 MG  Dispense: 90 tablet; Refill: 1 - buPROPion (WELLBUTRIN XL) 300 MG 24 hr tablet; Take 1 tablet (300 mg total) by mouth daily. TAKE WITH A 150 MG TABLET TO EQUAL TOTAL DOSE OF 450 MG  Dispense: 90 tablet; Refill: 1  Family/ staff Communication: Reviewed plan of care with patient verbalized understanding  Labs/tests ordered:  - CBC with Differential/Platelet; Future - CMP with eGFR([facility]); Future - TSH; Future - Lipid Panel; Future  Next Appointment : Return in about 6 months (around 06/11/2022) for medical mangement of chronic issues., fasting labs in one week.   Sandrea Hughs, NP

## 2021-12-10 ENCOUNTER — Ambulatory Visit (INDEPENDENT_AMBULATORY_CARE_PROVIDER_SITE_OTHER): Payer: 59 | Admitting: Psychiatry

## 2021-12-10 DIAGNOSIS — F4310 Post-traumatic stress disorder, unspecified: Secondary | ICD-10-CM

## 2021-12-10 NOTE — Progress Notes (Signed)
[facility] Counselor/Therapist Progress Note  Patient ID: Victoria Floyd, MRN: [id number],    Date: 12/10/2021  Time Spent: 56 minutes start time 12:03 Pm end time 12:59 PM  Treatment Type: Individual Therapy  Reported Symptoms: anxiety, flashbacks, grief issues,nightmare  Mental Status Exam:  Appearance:   Casual     Behavior:  Appropriate  Motor:  Normal  Speech/Language:   Normal Rate  Affect:  Appropriate and Tearful  Mood:  anxious and sad  Thought process:  normal  Thought content:    WNL  Sensory/Perceptual disturbances:    WNL  Orientation:  oriented to person, place, time/date, and situation  Attention:  Good  Concentration:  Good  Memory:  WNL  Fund of knowledge:   Good  Insight:    Good  Judgment:   Good  Impulse Control:  Good   Risk Assessment: Danger to Self:  No Self-injurious Behavior: No Danger to Others: No Duty to Warn:no Physical Aggression / Violence:No  Access to Firearms a concern: No  Gang Involvement:No   Subjective: Patient was present for session. She shared that she had someone that had a stroke at work and she had to take care of her. She went on to share that she was frustrated at work. 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  Patient stated she is hopeful that that will help her feel better since that is her happy place.  Patient stated she cannot keep from thinking about last session when she realized that Victoria Floyd had a lot to do with what went on with her daughter potentially.  And memories of that time seem to be surfacing.  She shared the issue she wanted to address is Victoria Floyd just caring about her daughter's phone, suds level 10, negative cognition "I should have stopped it", felt guilt and anger in her arms and legs.  Patient was able to reduce suds level to 3.  She was able to recognize that Victoria Floyd's behavior was not congruent for what had just happened  and for someone's sharing that their loved one had just committed suicide.  She was also able to realize she did everything she could that she does not know what transpired after she and her daughter hung up between she and Victoria Floyd but she knows that the things that she has been sharing do not add up and she has to let that be with Victoria Floyd and not try and think about it.  The importance of journaling and releasing emotions appropriately were discussed with patient.  We will continue processing at next session  Interventions: Eye Movement Desensitization and Reprocessing (EMDR) and Insight-Oriented  Diagnosis:   ICD-10-CM   1. PTSD (post-traumatic stress disorder)  F43.10       Plan:  Patient is to practice coping skills discussed in session including grounding and 5, counting backwards from 100 by twos or threes, and brain spotting exercise vergence.  Patient is to follow plans from session to monitor her mood and discuss it further with her provider Victoria Floyd p.m. HNP at their next appointment.  The patient is to take medication as directed.  Patient is to try doing a brain dump sort of journaling prior to sleep. Long-term goal: Develop and implement effective coping skills to carry out normal responsibilities and participate constructively in relationships Short-term goal: Sleep without being disturbed by dreams of trauma: Verbalize hopeful and positive statements regarding the  future  Victoria Floyd, [facility]

## 2021-12-20 ENCOUNTER — Other Ambulatory Visit: Payer: Managed Care, Other (non HMO)

## 2021-12-20 DIAGNOSIS — I10 Essential (primary) hypertension: Secondary | ICD-10-CM

## 2021-12-20 DIAGNOSIS — E782 Mixed hyperlipidemia: Secondary | ICD-10-CM

## 2021-12-23 ENCOUNTER — Other Ambulatory Visit: Payer: Self-pay | Admitting: Psychiatry

## 2021-12-23 DIAGNOSIS — F4310 Post-traumatic stress disorder, unspecified: Secondary | ICD-10-CM

## 2021-12-23 DIAGNOSIS — F331 Major depressive disorder, recurrent, moderate: Secondary | ICD-10-CM

## 2021-12-23 DIAGNOSIS — F411 Generalized anxiety disorder: Secondary | ICD-10-CM

## 2021-12-25 ENCOUNTER — Ambulatory Visit (INDEPENDENT_AMBULATORY_CARE_PROVIDER_SITE_OTHER): Payer: 59 | Admitting: Psychiatry

## 2021-12-25 DIAGNOSIS — F4310 Post-traumatic stress disorder, unspecified: Secondary | ICD-10-CM
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
## 2021-12-26 ENCOUNTER — Ambulatory Visit: Payer: 59 | Admitting: Psychiatry

## 2022-01-03 ENCOUNTER — Telehealth: Payer: Self-pay | Admitting: Psychiatry

## 2022-01-03 ENCOUNTER — Other Ambulatory Visit: Payer: Self-pay

## 2022-01-03 DIAGNOSIS — M5412 Radiculopathy, cervical region: Secondary | ICD-10-CM

## 2022-01-03 MED ORDER — GABAPENTIN 300 MG PO CAPS: ORAL_CAPSULE | ORAL | 0 refills | Status: DC

## 2022-01-03 NOTE — Telephone Encounter (Signed)
Victoria Floyd called today at 10:45 to request refill of her gabapentin.  Appt 03/07/22.  Her normal pharmacy [facility] has changed their hours and she can't get there when they are open so send the prescription to [facility] on [address]/ [place].

## 2022-01-03 NOTE — Telephone Encounter (Signed)
Rx sent 

## 2022-01-13 ENCOUNTER — Ambulatory Visit: Payer: 59 | Admitting: Psychiatry

## 2022-01-23 ENCOUNTER — Ambulatory Visit (INDEPENDENT_AMBULATORY_CARE_PROVIDER_SITE_OTHER): Payer: 59 | Admitting: Psychiatry

## 2022-01-23 DIAGNOSIS — F4310 Post-traumatic stress disorder, unspecified: Secondary | ICD-10-CM | POA: Diagnosis not present

## 2022-01-23 NOTE — Progress Notes (Signed)
      [facility] Counselor/Therapist Progress Note  Patient ID: Victoria Floyd, MRN: [id number],    Date: 01/23/2022  Time Spent: 50 minutes start time 12:07 PM end time 12:57 PM  Treatment Type: Individual Therapy  Reported Symptoms: sleep issues, anxiety, depression, nightmares, sadness  Mental Status Exam:  Appearance:   Casual and Neat     Behavior:  Appropriate  Motor:  Normal  Speech/Language:   Normal Rate  Affect:  Appropriate  Mood:  anxious and sad  Thought process:  normal  Thought content:    WNL  Sensory/Perceptual disturbances:    WNL  Orientation:  oriented to person, place, time/date, and situation  Attention:  Good  Concentration:  Good  Memory:  WNL  Fund of knowledge:   Good  Insight:    Good  Judgment:   Good  Impulse Control:  Good   Risk Assessment: Danger to Self:  No Self-injurious Behavior: No Danger to Others: No Duty to Warn:no Physical Aggression / Violence:No  Access to Firearms a concern: No  Gang Involvement:No   Subjective: Patient was present for session.  She was doing okay until last week when she had a nightmare that was very triggering. She stated she has gone to the movies a few times with her mother and she has been getting in the pool and relaxing some when she can.  Patient did processing set on the nightmare of seeing someone shoot themselves, suds level 7, negative cognition "I am not okay" felt anxiety in her chest.  Patient was able to reduce suds level to 3.  She was able to recognize that she can change the picture and look at it more as just information that her daughter died very quickly and it was okay and she said okay place now.  Discussed the importance of taking care of herself and working on reminding herself of the truth over the next 2 weeks.  Patient stated that she is going to be spending the weekend with her son and his family and that is a positive thing and will help her keep things focused in a good  direction.  Interventions: Eye Movement Desensitization and Reprocessing (EMDR) and Insight-Oriented  Diagnosis:   ICD-10-CM   1. PTSD (post-traumatic stress disorder)  F43.10       Plan:  Patient is to practice coping skills discussed in session including grounding and 5, counting backwards from 100 by twos or threes, and brain spotting exercise vergence.  Patient is to follow plans from session to monitor her mood and discuss it further with her provider Corie Chiquito PMHNP at their next appointment.  The patient is to take medication as directed.  Patient is to try doing a brain dump sort of journaling prior to sleep. Long-term goal: Develop and implement effective coping skills to carry out normal responsibilities and participate constructively in relationships Short-term goal: Sleep without being disturbed by dreams of trauma: Verbalize hopeful and positive statements regarding the future  Stevphen Meuse, [facility]

## 2022-02-05 ENCOUNTER — Ambulatory Visit: Payer: 59 | Admitting: Psychiatry

## 2022-02-11 ENCOUNTER — Other Ambulatory Visit: Payer: Self-pay | Admitting: *Deleted

## 2022-02-11 ENCOUNTER — Other Ambulatory Visit: Payer: Self-pay

## 2022-02-11 DIAGNOSIS — M5412 Radiculopathy, cervical region: Secondary | ICD-10-CM

## 2022-02-11 DIAGNOSIS — J302 Other seasonal allergic rhinitis: Secondary | ICD-10-CM

## 2022-02-11 MED ORDER — FLUTICASONE PROPIONATE 50 MCG/ACT NA SUSP: 2.0000 | Freq: Every day | NASAL | 11 refills | Status: DC | PRN

## 2022-02-11 MED ORDER — METHOCARBAMOL 500 MG PO TABS: ORAL_TABLET | ORAL | 1 refills | Status: DC

## 2022-02-11 NOTE — Telephone Encounter (Signed)
Patient called and left message on Clinical intake stating that we are not refilling her Gabapentin Rx and wants to know why.   Tried to call patient back but LMOM to return call.   It looks like a [facility] is refilling that medication. Epic LR was made by her on 01/03/2022 for #120  Pended Flonase Rx as requested and awaiting callback.

## 2022-02-11 NOTE — Telephone Encounter (Signed)
It appears the requested medication is a maintenance medication. I will send to Lauree Chandler, NP to confirm ok to give a year supply of medication

## 2022-02-11 NOTE — Telephone Encounter (Signed)
Patient returned call and stated that Lauree Chandler, NP usually fills the gabapentin, however [facility] may have filled it on 01/03/22 as a favor but that is not the standard.

## 2022-02-12 ENCOUNTER — Telehealth: Payer: Self-pay | Admitting: Psychiatry

## 2022-02-12 ENCOUNTER — Other Ambulatory Visit: Payer: Self-pay

## 2022-02-12 DIAGNOSIS — M5412 Radiculopathy, cervical region: Secondary | ICD-10-CM

## 2022-02-12 MED ORDER — GABAPENTIN 300 MG PO CAPS: ORAL_CAPSULE | ORAL | 0 refills | Status: DC

## 2022-02-12 NOTE — Telephone Encounter (Signed)
I was reviewing patients chart and noted that [facility], PMHNP prescribed.   Patient aware

## 2022-02-12 NOTE — Telephone Encounter (Signed)
Rx sent 

## 2022-02-12 NOTE — Telephone Encounter (Signed)
Victoria Floyd called today at 9:23 to request refill of her gabapentin.  She said the pharmacy keeps requesting it from her PCP.  Appt 10./13,  Send to [facility] in [place].  She also said to remove the [facility] from her preferred pharmacies.

## 2022-02-20 ENCOUNTER — Ambulatory Visit (INDEPENDENT_AMBULATORY_CARE_PROVIDER_SITE_OTHER): Payer: 59 | Admitting: Psychiatry

## 2022-02-20 DIAGNOSIS — F4310 Post-traumatic stress disorder, unspecified: Secondary | ICD-10-CM

## 2022-02-20 NOTE — Progress Notes (Signed)
      [facility] Counselor/Therapist Progress Note  Patient ID: Victoria Floyd, MRN: [id number],    Date: 02/20/2022  Time Spent: 48 minutes start time 1:12 PM end time 2:00 PM  Treatment Type: Individual Therapy  Reported Symptoms: anxiety, sadness, triggered responses  Mental Status Exam:  Appearance:   Well Groomed     Behavior:  Appropriate  Motor:  Normal  Speech/Language:   Normal Rate  Affect:  Appropriate  Mood:  anxious  Thought process:  normal  Thought content:    WNL  Sensory/Perceptual disturbances:    WNL  Orientation:  oriented to person, place, time/date, and situation  Attention:  Good  Concentration:  Good  Memory:  WNL  Fund of knowledge:   Good  Insight:    Good  Judgment:   Good  Impulse Control:  Good   Risk Assessment: Danger to Self:  No Self-injurious Behavior: No Danger to Others: No Duty to Warn:no Physical Aggression / Violence:No  Access to Firearms a concern: No  Gang Involvement:No   Subjective: Patient was present for session.  She shared she has not had any nightmares. She reported a few bad days but she is doing better overall. She hasn't thought about things from last session and feels that EMDR has helped her. She is spending time with her mom lately and that has been good.  Patient was allowed time just to discuss some of the good things that she has been doing recently with her mom and through that led to memories of things that she had done with her daughter.  Patient became tearful in a positive way as she discussed the positive memories.  Encouraged patient to start writing those memories down and reminding herself that she can live without guilt since she spent a great time with her daughter while she was on this earth.  Patient was able to acknowledge that she feels her daughters 51 years a life was very positive and she feels good about the relationship that they had.  Patient was encouraged to remind herself of those things  regularly  Interventions: Cognitive Behavioral Therapy and Solution-Oriented/Positive Psychology  Diagnosis:   ICD-10-CM   1. PTSD (post-traumatic stress disorder)  F43.10       Plan: Patient is to practice coping skills discussed in session including grounding and 5, counting backwards from 100 by twos or threes, and brain spotting exercise vergence.  Patient is to follow plans from session to remind herself of the positive things that she and her daughter did together and that she had a good 26 years of life.  The patient is to take medication as directed.  Patient is to try doing a brain dump sort of journaling prior to sleep. Long-term goal: Develop and implement effective coping skills to carry out normal responsibilities and participate constructively in relationships Short-term goal: Sleep without being disturbed by dreams of trauma: Verbalize hopeful and positive statements regarding the future  Lina Sayre, [facility]

## 2022-03-05 ENCOUNTER — Ambulatory Visit: Payer: 59 | Admitting: Psychiatry

## 2022-03-07 ENCOUNTER — Ambulatory Visit (INDEPENDENT_AMBULATORY_CARE_PROVIDER_SITE_OTHER): Payer: 59 | Admitting: Psychiatry

## 2022-03-11 ENCOUNTER — Encounter: Payer: Self-pay | Admitting: Psychiatry

## 2022-03-11 ENCOUNTER — Ambulatory Visit (INDEPENDENT_AMBULATORY_CARE_PROVIDER_SITE_OTHER): Payer: 59 | Admitting: Psychiatry

## 2022-03-11 DIAGNOSIS — F331 Major depressive disorder, recurrent, moderate: Secondary | ICD-10-CM

## 2022-03-11 DIAGNOSIS — F411 Generalized anxiety disorder: Secondary | ICD-10-CM

## 2022-03-11 DIAGNOSIS — F4310 Post-traumatic stress disorder, unspecified: Secondary | ICD-10-CM | POA: Diagnosis not present

## 2022-03-11 DIAGNOSIS — G4700 Insomnia, unspecified: Secondary | ICD-10-CM

## 2022-03-11 MED ORDER — ALPRAZOLAM 1 MG PO TABS: ORAL_TABLET | ORAL | 5 refills | Status: DC

## 2022-03-11 MED ORDER — GABAPENTIN 600 MG PO TABS: 600.0000 mg | ORAL_TABLET | Freq: Three times a day (TID) | ORAL | 1 refills | Status: DC

## 2022-03-11 MED ORDER — ZOLPIDEM TARTRATE 5 MG PO TABS: 5.0000 mg | ORAL_TABLET | Freq: Every day | ORAL | 5 refills | Status: DC

## 2022-03-11 MED ORDER — BUPROPION HCL ER (XL) 300 MG PO TB24: 300.0000 mg | ORAL_TABLET | Freq: Every day | ORAL | 1 refills | Status: DC

## 2022-03-11 MED ORDER — BUPROPION HCL ER (XL) 150 MG PO TB24: 150.0000 mg | ORAL_TABLET | Freq: Every day | ORAL | 1 refills | Status: DC

## 2022-03-11 NOTE — Progress Notes (Signed)
Victoria Floyd [id number] May 07, 1966 56 y.o.  Subjective:   Patient ID:  Victoria Floyd is a 56 y.o. (DOB [birth year]) female.  Chief Complaint:  Chief Complaint  Patient presents with   Anxiety   Depression    HPI Victoria Floyd presents to the office today for follow-up of anxiety, depression, and insomnia. "I've been ok. I haven't been having as many nightmares." She reports continued intrusive memories. She reports that she is now having "more good memories than bad." Denies flashbacks. Continued exaggerated startle response. She reports "every ambulance catches my eye." She reports occasional generalized anxiety and worry. She occ needs Xanax prn during the day and tries to avoid this if possible since Xanax causes some sleepiness. She denies panic. She reports that she has disconnected from friends and "can't fit in." She reports that she used to be more social and outgoing, where now she prefers to sit alone. She is in a couple of Facebook groups for mothers that have lost children. She reports depressed mood. She reports that fall tends to lift her mood because it is a season of change. Energy and motivation are low- "I have to really push myself." She reports that she will put off more involved chores. Appetite has been ok. She reports 30 lbs weight gain since daughter died. She reports that she falls asleep easily. She reports that her concentration is adequate at work. Denies SI.  Supervisor told her that she is up for promotion. She works 13-14 hour days 5 days a week and a shorter day on Saturday, when she takes her mother out to dinner and a movie.   She reports that she takes Xanax nightly and takes 1/2-1 tab as needed throughout the day.   Ambien last filled 02/24/22 x 2. Alprazolam last 02/24/22 x 2.  Past Psychiatric Medication Trials: Ambien- Able to fall asleep. Sometimes still had dreams. Took for about 20 years. Xanax- Prescribed two 1 mg tabs for about 20  years. Effexor- Sleepy Zoloft- Has increased anxiety  Prozac Lexapro Cymbalta- initially seemed to be helpful and then developed trembling Wellbutrin XL- Reports that this has been helpful for her mood and hot flashes. Gabapentin-Prescribed for pain. Trazodone-Increased anxiety, nausea  PHQ2-9    Flowsheet Row Office Visit from 12/09/2021 in [facility] and Adult Medicine Office Visit from 06/22/2018 in [facility] and Adult Medicine Office Visit from 04/07/2017 in [facility] and Adult Medicine Office Visit from 04/04/2016 in [facility] and Adult Medicine  PHQ-2 Total Score 0 0 0 0        Review of Systems:  Review of Systems  Musculoskeletal:  Positive for arthralgias. Negative for gait problem.  Neurological:        Neuropathy  Psychiatric/Behavioral:         Please refer to HPI    Medications: I have reviewed the patient's current medications.  Current Outpatient Medications  Medication Sig Dispense Refill   Aspirin-Acetaminophen-Caffeine (GOODY HEADACHE PO) Take by mouth.     atorvastatin (LIPITOR) 10 MG tablet Take 1 tablet (10 mg total) by mouth daily. 90 tablet 1   Biotin 1 MG CAPS Take by mouth.     fluticasone (FLONASE) 50 MCG/ACT nasal spray Place 2 sprays into both nostrils daily as needed for allergies or rhinitis. 16 g 11   gabapentin (NEURONTIN) 600 MG tablet Take 1 tablet (600 mg total) by mouth 3 (three) times daily. 270 tablet 1   meloxicam (MOBIC) 7.5  MG tablet Take 1 tablet (7.5 mg total) by mouth daily. 30 tablet 5   methocarbamol (ROBAXIN) 500 MG tablet TAKE 2 TABLETS BY MOUTH TWICE DAILY FOR MUSCLE SPAMS 120 tablet 1   metoprolol tartrate (LOPRESSOR) 100 MG tablet Take 2 tablets (200 mg total) by mouth daily. 90 tablet 1   Multiple Vitamin (MULTIVITAMIN) tablet Take 1 tablet by mouth daily.     [START ON 04/21/2022] ALPRAZolam (XANAX) 1 MG tablet TAKE 1/2 TO 1 TABLET BY MOUTH TWICE DAILY AS NEEDED 45 tablet 5    buPROPion (WELLBUTRIN XL) 150 MG 24 hr tablet Take 1 tablet (150 mg total) by mouth daily. TAKE WITH A 300 MG TABLET TO EQUAL TOTAL DOSE OF 450 MG 90 tablet 1   buPROPion (WELLBUTRIN XL) 300 MG 24 hr tablet Take 1 tablet (300 mg total) by mouth daily. TAKE WITH A 150 MG TABLET TO EQUAL TOTAL DOSE OF 450 MG 90 tablet 1   [START ON 04/21/2022] zolpidem (AMBIEN) 5 MG tablet Take 1 tablet (5 mg total) by mouth at bedtime. 30 tablet 5   No current facility-administered medications for this visit.    Medication Side Effects: None  Allergies:  Allergies  Allergen Reactions   Codeine Itching    Past Medical History:  Diagnosis Date   Allergy    Anxiety    Chronic constipation    Hypertension    hx of   Insomnia     Past Medical History, Surgical history, Social history, and Family history were reviewed and updated as appropriate.   Please see review of systems for further details on the patient's review from today.   Objective:   Physical Exam:  LMP 07/12/2014 Comment: Tubal Ligation and Neg Preg test  Physical Exam Constitutional:      General: She is not in acute distress. Musculoskeletal:        General: No deformity.  Neurological:     Mental Status: She is alert and oriented to person, place, and time.     Coordination: Coordination normal.  Psychiatric:        Attention and Perception: Attention and perception normal. She does not perceive auditory or visual hallucinations.        Mood and Affect: Affect is not labile, blunt, angry or inappropriate.        Speech: Speech normal.        Behavior: Behavior normal.        Thought Content: Thought content normal. Thought content is not paranoid or delusional. Thought content does not include homicidal or suicidal ideation. Thought content does not include homicidal or suicidal plan.        Cognition and Memory: Cognition and memory normal.        Judgment: Judgment normal.     Comments: Insight intact Mood is less anxious  and depressed compared to previous exams     Lab Review:     Component Value Date/Time   NA 140 04/02/2021 1525   K 3.7 04/02/2021 1525   CL 106 04/02/2021 1525   CO2 25 04/02/2021 1525   GLUCOSE 83 04/02/2021 1525   BUN 16 04/02/2021 1525   CREATININE 1.06 (H) 04/02/2021 1525   CALCIUM 9.6 04/02/2021 1525   PROT 7.0 04/02/2021 1525   ALBUMIN 4.4 11/21/2016 1510   AST 20 04/02/2021 1525   ALT 17 04/02/2021 1525   ALKPHOS 73 11/21/2016 1510   BILITOT 0.4 04/02/2021 1525   GFRNONAA 58 (L) 11/30/2020 1446   GFRAA  68 11/30/2020 1446       Component Value Date/Time   WBC 4.3 04/02/2021 1525   RBC 3.87 04/02/2021 1525   HGB 12.2 04/02/2021 1525   HCT 36.7 04/02/2021 1525   PLT 230 04/02/2021 1525   MCV 94.8 04/02/2021 1525   MCH 31.5 04/02/2021 1525   MCHC 33.2 04/02/2021 1525   RDW 11.9 04/02/2021 1525   LYMPHSABS 1,905 04/02/2021 1525   MONOABS 424 11/21/2016 1510   EOSABS 138 04/02/2021 1525   BASOSABS 22 04/02/2021 1525    No results found for: "POCLITH", "LITHIUM"   No results found for: "PHENYTOIN", "PHENOBARB", "VALPROATE", "CBMZ"   .res Assessment: Plan:    Pt seen for 30 minutes and time spent discussing treatment plan. She reports that she is occasionally needing additional gabapentin during the daytime for pain and finds that this may also be helpful for her anxiety. Will increase Gabapentin to 600 mg TID to improve anxiety. Discussed that this may also decrease need for frequent OTC pain relievers.  Continue Xanax 1 mg 1/2-1 tab po BID prn anxiety.  Continue Wellbutrin XL 300 mg po q am and 150 mg in the evening for depression. Pt reports that she had tolerability issues with taking 450 mg in the morning.  Continue Ambien 5 mg po QHS for insomnia.  Pt to follow-up in 6 months or sooner if clinically indicated.  Patient advised to contact office with any questions, adverse effects, or acute worsening in signs and symptoms.   Victoria Floyd was seen today for  anxiety and depression.  Diagnoses and all orders for this visit:  PTSD (post-traumatic stress disorder) -     gabapentin (NEURONTIN) 600 MG tablet; Take 1 tablet (600 mg total) by mouth 3 (three) times daily. -     ALPRAZolam (XANAX) 1 MG tablet; TAKE 1/2 TO 1 TABLET BY MOUTH TWICE DAILY AS NEEDED  GAD (generalized anxiety disorder) -     gabapentin (NEURONTIN) 600 MG tablet; Take 1 tablet (600 mg total) by mouth 3 (three) times daily. -     ALPRAZolam (XANAX) 1 MG tablet; TAKE 1/2 TO 1 TABLET BY MOUTH TWICE DAILY AS NEEDED  Insomnia, unspecified type -     zolpidem (AMBIEN) 5 MG tablet; Take 1 tablet (5 mg total) by mouth at bedtime.  Major depressive disorder, recurrent episode, moderate (HCC) -     buPROPion (WELLBUTRIN XL) 150 MG 24 hr tablet; Take 1 tablet (150 mg total) by mouth daily. TAKE WITH A 300 MG TABLET TO EQUAL TOTAL DOSE OF 450 MG -     buPROPion (WELLBUTRIN XL) 300 MG 24 hr tablet; Take 1 tablet (300 mg total) by mouth daily. TAKE WITH A 150 MG TABLET TO EQUAL TOTAL DOSE OF 450 MG     Please see After Visit Summary for patient specific instructions.  Future Appointments  Date Time Provider [place]  03/19/2022 12:00 PM Lina Sayre, [facility] CP-CP None  04/02/2022 12:00 PM Lina Sayre, [facility] CP-CP None  05/01/2022 11:00 AM Lina Sayre, [facility] CP-CP None  06/13/2022  3:20 PM Lauree Chandler, NP [facility] None  09/09/2022 11:30 AM [facility] Headings, PMHNP CP-CP None    No orders of the defined types were placed in this encounter.   -------------------------------

## 2022-03-19 ENCOUNTER — Ambulatory Visit: Payer: 59 | Admitting: Psychiatry

## 2022-03-20 ENCOUNTER — Other Ambulatory Visit: Payer: Self-pay | Admitting: Psychiatry

## 2022-03-20 DIAGNOSIS — M5412 Radiculopathy, cervical region: Secondary | ICD-10-CM

## 2022-04-02 ENCOUNTER — Ambulatory Visit (INDEPENDENT_AMBULATORY_CARE_PROVIDER_SITE_OTHER): Payer: 59 | Admitting: Psychiatry

## 2022-04-13 ENCOUNTER — Other Ambulatory Visit: Payer: Self-pay | Admitting: Psychiatry

## 2022-04-13 DIAGNOSIS — G4700 Insomnia, unspecified: Secondary | ICD-10-CM

## 2022-04-15 NOTE — Telephone Encounter (Signed)
Filled 10/30

## 2022-04-28 ENCOUNTER — Other Ambulatory Visit: Payer: Self-pay

## 2022-04-28 DIAGNOSIS — M5412 Radiculopathy, cervical region: Secondary | ICD-10-CM

## 2022-04-28 MED ORDER — METHOCARBAMOL 500 MG PO TABS: ORAL_TABLET | ORAL | 11 refills | Status: AC

## 2022-05-01 ENCOUNTER — Ambulatory Visit (INDEPENDENT_AMBULATORY_CARE_PROVIDER_SITE_OTHER): Payer: 59 | Admitting: Psychiatry

## 2022-05-15 ENCOUNTER — Ambulatory Visit: Payer: Managed Care, Other (non HMO) | Admitting: Family

## 2022-06-13 ENCOUNTER — Ambulatory Visit: Payer: Managed Care, Other (non HMO) | Admitting: Nurse Practitioner

## 2022-08-07 ENCOUNTER — Other Ambulatory Visit: Payer: Self-pay | Admitting: Psychiatry

## 2022-08-07 DIAGNOSIS — G4700 Insomnia, unspecified: Secondary | ICD-10-CM

## 2022-08-24 ENCOUNTER — Other Ambulatory Visit: Payer: Self-pay | Admitting: Family

## 2022-08-24 ENCOUNTER — Other Ambulatory Visit: Payer: Self-pay | Admitting: Psychiatry

## 2022-08-24 DIAGNOSIS — F331 Major depressive disorder, recurrent, moderate: Secondary | ICD-10-CM

## 2022-08-24 DIAGNOSIS — I10 Essential (primary) hypertension: Secondary | ICD-10-CM

## 2022-08-24 DIAGNOSIS — E782 Mixed hyperlipidemia: Secondary | ICD-10-CM

## 2022-08-25 NOTE — Telephone Encounter (Signed)
Pharmacy requested refill.  Pended Rx and sent to [facility] for approval due to [place].  Note added to Rx that patient needs an appointment before anymore FUTURE refills.

## 2022-09-05 ENCOUNTER — Encounter: Payer: Self-pay | Admitting: Gastroenterology

## 2022-09-09 ENCOUNTER — Encounter: Payer: Self-pay | Admitting: Psychiatry

## 2022-09-09 ENCOUNTER — Ambulatory Visit (INDEPENDENT_AMBULATORY_CARE_PROVIDER_SITE_OTHER): Payer: 59 | Admitting: Psychiatry

## 2022-09-09 DIAGNOSIS — F411 Generalized anxiety disorder: Secondary | ICD-10-CM | POA: Diagnosis not present

## 2022-09-09 DIAGNOSIS — F4310 Post-traumatic stress disorder, unspecified: Secondary | ICD-10-CM | POA: Diagnosis not present

## 2022-09-09 DIAGNOSIS — G4700 Insomnia, unspecified: Secondary | ICD-10-CM

## 2022-09-09 DIAGNOSIS — F331 Major depressive disorder, recurrent, moderate: Secondary | ICD-10-CM

## 2022-09-09 MED ORDER — ZOLPIDEM TARTRATE 5 MG PO TABS: 5.0000 mg | ORAL_TABLET | Freq: Every day | ORAL | 5 refills | Status: DC

## 2022-09-09 MED ORDER — BUPROPION HCL ER (XL) 300 MG PO TB24: 300.0000 mg | ORAL_TABLET | Freq: Every day | ORAL | 1 refills | Status: DC

## 2022-09-09 MED ORDER — GABAPENTIN 600 MG PO TABS
600.0000 mg | ORAL_TABLET | Freq: Three times a day (TID) | ORAL | 1 refills | Status: DC
Start: 2022-09-09 — End: 2023-03-12

## 2022-09-09 MED ORDER — ALPRAZOLAM 1 MG PO TABS: ORAL_TABLET | ORAL | 5 refills | Status: DC

## 2022-09-09 MED ORDER — BUPROPION HCL ER (XL) 150 MG PO TB24
ORAL_TABLET | ORAL | 1 refills | Status: AC
Start: 2022-09-09 — End: ?

## 2022-09-09 NOTE — Progress Notes (Unsigned)
Victoria Floyd [id number] 1965-12-13 58 y.o.  Subjective:   Patient ID:  Victoria Floyd is a 57 y.o. (DOB [birth year]) female.  Chief Complaint: No chief complaint on file.   HPI Victoria Floyd presents to the office today for follow-up of anxiety, depression, and insomnia. She reports that anxiety has been manageable. She is having nightmares about once a month. She reports intrusive memories and flashbacks- "I see her." She reports that she will periodically "get upset" when she has flashbacks at work and will need to take a break and go to the bathroom. Continued exaggerated startle response and her coworkers are aware and will approach her slowly. She reports hypervigilance. She reports some depression. She reports, "I can't do things by myself right now, but then I don't want to be around people." She reports that she goes to work, goes to visit her mom, the grocery store, and home. She reports that she will procrastinate on home projects. She reports that she no longer has friends- "it's like I don't know how to talk to people anymore. I don't fit anymore." She reports that she no longer has a sense of humor. She reports that her concentration and focus is ok unless she is distracted by intrusive memories. She reports that her work requires extreme focus and "that's probably why I work all the time." Sleep has been ok with medication. Appetite has been "too good." Now craves sweets and used to not like sweets. Denies binge eating. Denies SI.   She received a promotion. Working 7 days a week. She is needing to use 2-4 days of intermittent FMLA some months.  Took mother to [place] for Thanksgiving. Reports mother stayed in bed in the hotel for a couple of days. Concerned that mother has some depression and mother is no longer wanting to go out to dinner and a movie. Mother does not talk about traumatic loss. Reports that she was able to talk with son about the loss.   Has a friend that lost her  daughter.   Take Xanax daily.   Ambien last filled 09/05/22.  Xanax last filled 08/04/22 x 4. Gabapentin last filled 08/04/22.  Past Psychiatric Medication Trials: Ambien- Able to fall asleep. Sometimes still had dreams. Took for about 20 years. Xanax- Prescribed two 1 mg tabs for about 20 years. Effexor- Sleepy Zoloft- Has increased anxiety  Prozac Lexapro Cymbalta- initially seemed to be helpful and then developed trembling Wellbutrin XL- Reports that this has been helpful for her mood and hot flashes. Gabapentin-Prescribed for pain. Trazodone-Increased anxiety, nausea  PHQ2-9    Flowsheet Row Office Visit from 12/09/2021 in [facility] & Adult Medicine Office Visit from 06/22/2018 in [facility] & [facility] Office Visit from 04/07/2017 in [facility] & Adult Medicine Office Visit from 04/04/2016 in [facility] & [facility]  PHQ-2 Total Score 0 0 0 0        Review of Systems:  Review of Systems  Musculoskeletal:  Negative for gait problem.  Allergic/Immunologic: Positive for environmental allergies.  Psychiatric/Behavioral:         Please refer to HPI    Medications: I have reviewed the patient's current medications.  Current Outpatient Medications  Medication Sig Dispense Refill   ALPRAZolam (XANAX) 1 MG tablet TAKE 1/2 TO 1 TABLET BY MOUTH TWICE DAILY AS NEEDED 45 tablet 5   Aspirin-Acetaminophen-Caffeine (GOODY HEADACHE PO) Take by mouth.  atorvastatin (LIPITOR) 10 MG tablet Take 1 tablet (10 mg total) by mouth daily. 90 tablet 1   Biotin 1 MG CAPS Take by mouth.     buPROPion (WELLBUTRIN XL) 150 MG 24 hr tablet Take 1 tablet by mouth daily. TAKE WITH A 300 MG TABLET TO EQUAL TOTAL DOSE OF 450 MG 30 tablet 0   buPROPion (WELLBUTRIN XL) 300 MG 24 hr tablet Take 1 tablet (300 mg total) by mouth daily. TAKE WITH A 150 MG TABLET TO EQUAL TOTAL DOSE OF 450 MG 90 tablet 1    fluticasone (FLONASE) 50 MCG/ACT nasal spray Place 2 sprays into both nostrils daily as needed for allergies or rhinitis. 16 g 11   gabapentin (NEURONTIN) 600 MG tablet Take 1 tablet (600 mg total) by mouth 3 (three) times daily. 270 tablet 1   meloxicam (MOBIC) 7.5 MG tablet Take 1 tablet (7.5 mg total) by mouth daily. 30 tablet 5   methocarbamol (ROBAXIN) 500 MG tablet TAKE 2 TABLETS BY MOUTH TWICE DAILY FOR MUSCLE SPAMS 120 tablet 11   metoprolol tartrate (LOPRESSOR) 100 MG tablet Take 2 tablets (200 mg total) by mouth daily. 90 tablet 1   Multiple Vitamin (MULTIVITAMIN) tablet Take 1 tablet by mouth daily.     zolpidem (AMBIEN) 5 MG tablet Take 1 tablet (5 mg total) by mouth at bedtime. 30 tablet 2   No current facility-administered medications for this visit.    Medication Side Effects: None  Allergies:  Allergies  Allergen Reactions   Codeine Itching    Past Medical History:  Diagnosis Date   Allergy    Anxiety    Chronic constipation    Hypertension    hx of   Insomnia     Past Medical History, Surgical history, Social history, and Family history were reviewed and updated as appropriate.   Please see review of systems for further details on the patient's review from today.   Objective:   Physical Exam:  LMP 07/12/2014 Comment: Tubal Ligation and Neg Preg test  Physical Exam  Lab Review:     Component Value Date/Time   NA 140 04/02/2021 1525   K 3.7 04/02/2021 1525   CL 106 04/02/2021 1525   CO2 25 04/02/2021 1525   GLUCOSE 83 04/02/2021 1525   BUN 16 04/02/2021 1525   CREATININE 1.06 (H) 04/02/2021 1525   CALCIUM 9.6 04/02/2021 1525   PROT 7.0 04/02/2021 1525   ALBUMIN 4.4 11/21/2016 1510   AST 20 04/02/2021 1525   ALT 17 04/02/2021 1525   ALKPHOS 73 11/21/2016 1510   BILITOT 0.4 04/02/2021 1525   GFRNONAA 58 (L) 11/30/2020 1446   GFRAA 68 11/30/2020 1446       Component Value Date/Time   WBC 4.3 04/02/2021 1525   RBC 3.87 04/02/2021 1525   HGB  12.2 04/02/2021 1525   HCT 36.7 04/02/2021 1525   PLT 230 04/02/2021 1525   MCV 94.8 04/02/2021 1525   MCH 31.5 04/02/2021 1525   MCHC 33.2 04/02/2021 1525   RDW 11.9 04/02/2021 1525   LYMPHSABS 1,905 04/02/2021 1525   MONOABS 424 11/21/2016 1510   EOSABS 138 04/02/2021 1525   BASOSABS 22 04/02/2021 1525    No results found for: "POCLITH", "LITHIUM"   No results found for: "PHENYTOIN", "PHENOBARB", "VALPROATE", "CBMZ"   .res Assessment: Plan:    There are no diagnoses linked to this encounter.   Please see After Visit Summary for patient specific instructions.  Future Appointments  Date Time Provider  Department Center  11/03/2022 12:30 PM [facility] PREVISIT RM [facility]  12/01/2022  3:00 PM Napoleon Form, MD [facility]  01/07/2023  4:00 PM Stevphen Meuse, [facility] CP-CP None    No orders of the defined types were placed in this encounter.   -------------------------------

## 2022-09-15 ENCOUNTER — Other Ambulatory Visit: Payer: Self-pay | Admitting: Psychiatry

## 2022-09-15 ENCOUNTER — Other Ambulatory Visit: Payer: Self-pay | Admitting: Family

## 2022-09-15 DIAGNOSIS — F411 Generalized anxiety disorder: Secondary | ICD-10-CM

## 2022-09-15 DIAGNOSIS — I10 Essential (primary) hypertension: Secondary | ICD-10-CM

## 2022-09-15 DIAGNOSIS — F4310 Post-traumatic stress disorder, unspecified: Secondary | ICD-10-CM

## 2022-09-16 NOTE — Telephone Encounter (Signed)
Patient medication has High Risk Warnings. Medication pend and sent to PCP Janyth Contes Janene Harvey, NP for approval.

## 2022-09-24 ENCOUNTER — Telehealth: Payer: Self-pay | Admitting: Psychiatry

## 2022-09-24 NOTE — Telephone Encounter (Signed)
Received fax from [employer] for completion of an FMLA form on [place]. Placed in Traci's box to complete.

## 2022-09-29 DIAGNOSIS — Z0289 Encounter for other administrative examinations: Secondary | ICD-10-CM

## 2022-09-29 NOTE — Telephone Encounter (Signed)
Noted thanks °

## 2022-09-29 NOTE — Telephone Encounter (Signed)
Left pt a message asking if she is picking it up or are we faxing.

## 2022-09-29 NOTE — Telephone Encounter (Signed)
Pt called back and asked if we could fax it to [facility]

## 2022-09-29 NOTE — Telephone Encounter (Signed)
Paper work completed and given to Jessica to review and sign 

## 2022-10-05 ENCOUNTER — Other Ambulatory Visit: Payer: Self-pay | Admitting: Psychiatry

## 2022-10-05 ENCOUNTER — Other Ambulatory Visit: Payer: Self-pay | Admitting: Family

## 2022-10-05 DIAGNOSIS — M150 Primary generalized (osteo)arthritis: Secondary | ICD-10-CM

## 2022-10-05 DIAGNOSIS — M159 Polyosteoarthritis, unspecified: Secondary | ICD-10-CM

## 2022-10-05 DIAGNOSIS — F411 Generalized anxiety disorder: Secondary | ICD-10-CM

## 2022-10-05 DIAGNOSIS — F4310 Post-traumatic stress disorder, unspecified: Secondary | ICD-10-CM

## 2022-10-06 NOTE — Telephone Encounter (Signed)
Needs follow-up

## 2022-10-06 NOTE — Telephone Encounter (Signed)
Pharmacy note states that this medication was already refilled 09/15/2022. Im not sure why we got refill sent to [place] if that's the case.

## 2022-10-15 ENCOUNTER — Other Ambulatory Visit: Payer: Self-pay | Admitting: Nurse Practitioner

## 2022-10-15 ENCOUNTER — Other Ambulatory Visit: Payer: Self-pay | Admitting: Family

## 2022-10-15 DIAGNOSIS — M159 Polyosteoarthritis, unspecified: Secondary | ICD-10-CM

## 2022-10-15 DIAGNOSIS — I10 Essential (primary) hypertension: Secondary | ICD-10-CM

## 2022-10-15 DIAGNOSIS — E782 Mixed hyperlipidemia: Secondary | ICD-10-CM

## 2022-10-15 DIAGNOSIS — M150 Primary generalized (osteo)arthritis: Secondary | ICD-10-CM

## 2022-10-16 ENCOUNTER — Other Ambulatory Visit: Payer: Self-pay | Admitting: Nurse Practitioner

## 2022-10-16 ENCOUNTER — Other Ambulatory Visit: Payer: Self-pay | Admitting: Family

## 2022-10-16 DIAGNOSIS — M159 Polyosteoarthritis, unspecified: Secondary | ICD-10-CM

## 2022-10-16 DIAGNOSIS — I10 Essential (primary) hypertension: Secondary | ICD-10-CM

## 2022-10-16 DIAGNOSIS — E782 Mixed hyperlipidemia: Secondary | ICD-10-CM

## 2022-10-22 ENCOUNTER — Other Ambulatory Visit: Payer: Self-pay

## 2022-10-22 DIAGNOSIS — I10 Essential (primary) hypertension: Secondary | ICD-10-CM

## 2022-10-22 MED ORDER — METOPROLOL TARTRATE 100 MG PO TABS
ORAL_TABLET | ORAL | 0 refills | Status: DC
Start: 2022-10-22 — End: 2022-10-28

## 2022-10-22 NOTE — Telephone Encounter (Signed)
Patient has appointment tomorrow 10/23/2022 but states her blood pressure is running high so she needs refill. Patient given 2 day supply. Patient states that she's out of town right now but will pick it up when she comes back. Medication has high risk warnings. Medication pend and sent to PCP Janyth Contes Janene Harvey, NP

## 2022-10-23 ENCOUNTER — Encounter: Payer: Managed Care, Other (non HMO) | Admitting: Family

## 2022-10-23 NOTE — Telephone Encounter (Signed)
Refill addressed by PCP Eubanks, Jessica K, NP  

## 2022-10-28 ENCOUNTER — Encounter: Payer: Self-pay | Admitting: Family

## 2022-10-28 ENCOUNTER — Ambulatory Visit (INDEPENDENT_AMBULATORY_CARE_PROVIDER_SITE_OTHER): Payer: Managed Care, Other (non HMO) | Admitting: Family

## 2022-10-28 VITALS — BP 124/84 | HR 65 | Temp 97.6°F | Resp 20 | Ht 60.0 in | Wt 143.6 lb

## 2022-10-28 DIAGNOSIS — M159 Polyosteoarthritis, unspecified: Secondary | ICD-10-CM

## 2022-10-28 DIAGNOSIS — Z124 Encounter for screening for malignant neoplasm of cervix: Secondary | ICD-10-CM

## 2022-10-28 DIAGNOSIS — E782 Mixed hyperlipidemia: Secondary | ICD-10-CM

## 2022-10-28 DIAGNOSIS — Z1231 Encounter for screening mammogram for malignant neoplasm of breast: Secondary | ICD-10-CM

## 2022-10-28 DIAGNOSIS — I10 Essential (primary) hypertension: Secondary | ICD-10-CM

## 2022-10-28 DIAGNOSIS — M150 Primary generalized (osteo)arthritis: Secondary | ICD-10-CM

## 2022-10-28 LAB — CBC WITH DIFFERENTIAL/PLATELET
Absolute Monocytes: 611 cells/uL (ref 200–950)
Eosinophils Relative: 1.1 %
Hemoglobin: 12.6 g/dL (ref 11.7–15.5)
Neutro Abs: 4310 cells/uL (ref 1500–7800)
Platelets: 307 10*3/uL (ref 140–400)
RBC: 3.95 10*6/uL (ref 3.80–5.10)

## 2022-10-28 MED ORDER — METOPROLOL TARTRATE 100 MG PO TABS
ORAL_TABLET | ORAL | 1 refills | Status: DC
Start: 2022-10-28 — End: 2023-04-20

## 2022-10-28 MED ORDER — MELOXICAM 7.5 MG PO TABS
7.5000 mg | ORAL_TABLET | Freq: Every day | ORAL | 3 refills | Status: DC
Start: 2022-10-28 — End: 2023-04-29

## 2022-10-28 MED ORDER — ATORVASTATIN CALCIUM 10 MG PO TABS
10.0000 mg | ORAL_TABLET | Freq: Every day | ORAL | 1 refills | Status: DC
Start: 2022-10-28 — End: 2022-10-30

## 2022-10-28 NOTE — Progress Notes (Signed)
Provider: Princeton Nabor FNP-C   Sharon Seller, NP  Patient Care Team: Sharon Seller, NP as PCP - General (Geriatric Medicine) Rhea Belton [name], MD as Consulting Physician (Gastroenterology)  Extended Emergency Contact Information Primary Emergency Contact: [name] Address: [address]          [facility] [place], [place] [address] [place] Home Phone: [phone] Relation: Mother  Code Status:  Full Code  Goals of care: Advanced Directive information    12/09/2021    8:31 AM  Advanced Directives  Does Patient Have a Medical Advance Directive? No  Would patient like information on creating a medical advance directive? No - Patient declined     Chief Complaint  Patient presents with   Medical Management of Chronic Issues    Patient presents today for a 11 month follow-up   Quality Metric Gaps    Colonoscopy, mammogram, pap smear, COVID#2, zoster    HPI:  Pt is a 57 y.o. female seen today for 11 months follow up for medical management of chronic diseases.request medication refill.  No home blood pressure log for review but states readings have been in the 120's.she denies any headache,dizziness,vision changes,fatigue,chest tightness,palpitation,chest pain or shortness of breath.    Has chronic right shoulder pain states pain worst when stretching hand out or reaching up. No weakness,numbness or tingling of hand. Discussed referral to orthopedic but declined states has too many appointments for now.she will call provider if referral desired. Also states has chronic left hip pain which comes every now and then. No radiation.   Has upcoming appointment for screening colonoscopy Monday 12/01/2022.  Due for screening mammogram.Agrees to get mammogram at breast center.will order this visit.  Also due for shingles and COVID-19 vaccine but declines.Advised to get vaccine at the pharmacy if she changes her mind.     Past Medical History:  Diagnosis Date    Allergy    Anxiety    Chronic constipation    Hypertension    hx of   Insomnia    Past Surgical History:  Procedure Laterality Date   ANTERIOR CERVICAL DECOMP/DISCECTOMY FUSION N/A 07/12/2014   Procedure: ANTERIOR CERVICAL DECOMPRESSION/DISCECTOMY FUSION 2 LEVELS;  Surgeon: [name], MD;  Location: [facility] OR;  Service: Orthopedics;  Laterality: N/A;  Anterior cervical decompression fusion, cervical 5-6, cervical 6-7 with instrumentation, allograft   BUNIONECTOMY Bilateral    CERVICAL DISC SURGERY  07/12/2014   C5 C6 C7    DR [name]   TUBAL LIGATION     WISDOM TOOTH EXTRACTION      Allergies  Allergen Reactions   Codeine Itching    Allergies as of 10/28/2022       Reactions   Codeine Itching        Medication List        Accurate as of October 28, 2022  4:02 PM. If you have any questions, ask your nurse or doctor.          ALPRAZolam 1 MG tablet Commonly known as: XANAX TAKE 1/2 TO 1 TABLET BY MOUTH TWICE DAILY AS NEEDED   atorvastatin 10 MG tablet Commonly known as: LIPITOR Take 1 tablet (10 mg total) by mouth daily.   Biotin 1 MG Caps Take by mouth.   buPROPion 150 MG 24 hr tablet Commonly known as: WELLBUTRIN XL Take 1 tablet by mouth daily. TAKE WITH A 300 MG TABLET TO EQUAL TOTAL DOSE OF 450 MG   buPROPion 300 MG 24 hr tablet Commonly  known as: WELLBUTRIN XL Take 1 tablet (300 mg total) by mouth daily. TAKE WITH A 150 MG TABLET TO EQUAL TOTAL DOSE OF 450 MG   fluticasone 50 MCG/ACT nasal spray Commonly known as: FLONASE Place 2 sprays into both nostrils daily as needed for allergies or rhinitis.   gabapentin 600 MG tablet Commonly known as: Neurontin Take 1 tablet (600 mg total) by mouth 3 (three) times daily.   GOODY HEADACHE PO Take by mouth.   meloxicam 7.5 MG tablet Commonly known as: MOBIC Take 1 tablet (7.5 mg total) by mouth daily.   methocarbamol 500 MG tablet Commonly known as: ROBAXIN TAKE 2 TABLETS BY MOUTH TWICE DAILY  FOR MUSCLE SPAMS   metoprolol tartrate 100 MG tablet Commonly known as: LOPRESSOR 2 tablets by mouth daily. NEED APPT BEFORE ANY ADDITIONAL REFILLS   multivitamin tablet Take 1 tablet by mouth daily.   zolpidem 5 MG tablet Commonly known as: AMBIEN Take 1 tablet (5 mg total) by mouth at bedtime.        Review of Systems  Constitutional:  Negative for appetite change, chills, fatigue, fever and unexpected weight change.  HENT:  Negative for congestion, dental problem, ear discharge, ear pain, facial swelling, hearing loss, nosebleeds, postnasal drip, rhinorrhea, sinus pressure, sinus pain, sneezing, sore throat, tinnitus and trouble swallowing.   Eyes:  Negative for pain, discharge, redness, itching and visual disturbance.  Respiratory:  Negative for cough, chest tightness, shortness of breath and wheezing.   Cardiovascular:  Negative for chest pain, palpitations and leg swelling.  Gastrointestinal:  Negative for abdominal distention, abdominal pain, blood in stool, constipation, diarrhea, nausea and vomiting.  Endocrine: Negative for cold intolerance, heat intolerance, polydipsia, polyphagia and polyuria.  Genitourinary:  Negative for difficulty urinating, dysuria, flank pain, frequency and urgency.  Musculoskeletal:  Positive for arthralgias. Negative for back pain, gait problem, joint swelling, myalgias, neck pain and neck stiffness.       Right shoulder chronic pain and left hip pain.   Skin:  Negative for color change, pallor, rash and wound.  Neurological:  Negative for dizziness, syncope, speech difficulty, weakness, light-headedness, numbness and headaches.  Hematological:  Does not bruise/bleed easily.  Psychiatric/Behavioral:  Negative for agitation, behavioral problems, confusion, hallucinations, self-injury, sleep disturbance and suicidal ideas. The patient is not nervous/anxious.     Immunization History  Administered Date(s) Administered   Influenza-Unspecified  02/24/2015   Janssen (J&J) SARS-COV-2 Vaccination 10/22/2019   Tdap 02/24/2015   Pertinent  Health Maintenance Due  Topic Date Due   PAP SMEAR-Modifier  Never done   MAMMOGRAM  06/03/2019   Colonoscopy  08/12/2021   INFLUENZA VACCINE  12/25/2022      01/01/2018   11:22 AM 10/27/2019    4:00 PM 11/30/2020    2:21 PM 12/09/2021    8:32 AM 10/28/2022    3:21 PM  Fall Risk  Falls in the past year? No 0 0 0 0  Was there an injury with Fall?  0 0 0 0  Fall Risk Category Calculator  0 0 0 0  Fall Risk Category (Retired)  Low Low Low   (RETIRED) Patient Fall Risk Level  Low fall risk Low fall risk Low fall risk   Patient at Risk for Falls Due to   No Fall Risks No Fall Risks No Fall Risks  Fall risk Follow up   Falls evaluation completed Falls evaluation completed Falls evaluation completed   Functional Status Survey:    Vitals:   10/28/22 1517  BP: 124/84  Pulse: 65  Resp: 20  Temp: 97.6 F (36.4 C)  SpO2: 98%  Weight: 143 lb 9.6 oz (65.1 kg)  Height: 5' (1.524 m)   Body mass index is 28.04 kg/m. Physical Exam Vitals reviewed.  Constitutional:      General: She is not in acute distress.    Appearance: Normal appearance. She is overweight. She is not ill-appearing or diaphoretic.  HENT:     Head: Normocephalic.     Right Ear: Tympanic membrane, ear canal and external ear normal. There is no impacted cerumen.     Left Ear: Tympanic membrane, ear canal and external ear normal. There is no impacted cerumen.     Nose: Nose normal. No congestion or rhinorrhea.     Mouth/Throat:     Mouth: Mucous membranes are moist.     Pharynx: Oropharynx is clear. No oropharyngeal exudate or posterior oropharyngeal erythema.  Eyes:     General: No scleral icterus.       Right eye: No discharge.        Left eye: No discharge.     Extraocular Movements: Extraocular movements intact.     Conjunctiva/sclera: Conjunctivae normal.     Pupils: Pupils are equal, round, and reactive to light.  Neck:      Vascular: No carotid bruit.  Cardiovascular:     Rate and Rhythm: Normal rate and regular rhythm.     Pulses: Normal pulses.     Heart sounds: Normal heart sounds. No murmur heard.    No friction rub. No gallop.  Pulmonary:     Effort: Pulmonary effort is normal. No respiratory distress.     Breath sounds: Normal breath sounds. No wheezing, rhonchi or rales.  Chest:     Chest wall: No tenderness.  Abdominal:     General: Bowel sounds are normal. There is no distension.     Palpations: Abdomen is soft. There is no mass.     Tenderness: There is no abdominal tenderness. There is no right CVA tenderness, left CVA tenderness, guarding or rebound.  Musculoskeletal:        General: No swelling or tenderness. Normal range of motion.     Cervical back: Normal range of motion. No rigidity or tenderness.     Right lower leg: No edema.     Left lower leg: No edema.  Lymphadenopathy:     Cervical: No cervical adenopathy.  Skin:    General: Skin is warm and dry.     Coloration: Skin is not pale.     Findings: No bruising, erythema, lesion or rash.  Neurological:     Mental Status: She is alert and oriented to person, place, and time.     Cranial Nerves: No cranial nerve deficit.     Sensory: No sensory deficit.     Motor: No weakness.     Coordination: Coordination normal.     Gait: Gait normal.  Psychiatric:        Mood and Affect: Mood normal.        Speech: Speech normal.        Behavior: Behavior normal.        Thought Content: Thought content normal.        Judgment: Judgment normal.     Labs reviewed: No results for input(s): "NA", "K", "CL", "CO2", "GLUCOSE", "BUN", "CREATININE", "CALCIUM", "MG", "PHOS" in the last 8760 hours. No results for input(s): "AST", "ALT", "ALKPHOS", "BILITOT", "PROT", "ALBUMIN" in the last 8760 hours.  No results for input(s): "WBC", "NEUTROABS", "HGB", "HCT", "MCV", "PLT" in the last 8760 hours. Lab Results  Component Value Date   TSH 1.48  01/28/2019   No results found for: "HGBA1C" Lab Results  Component Value Date   CHOL 184 04/02/2021   HDL 68 04/02/2021   LDLCALC 99 04/02/2021   TRIG 79 04/02/2021   CHOLHDL 2.7 04/02/2021    Significant Diagnostic Results in last 30 days:  No results found.  Assessment/Plan 1. Mixed hyperlipidemia LDL at gaol  - continue on atorvastatin  - dietary modification and exercise advised  - Lipid panel  2. Essential hypertension B/p well controlled  - continue on metoprolol  - metoprolol tartrate (LOPRESSOR) 100 MG tablet; 2 tablets by mouth daily.  Dispense: 180 tablet; Refill: 1 - TSH - COMPLETE METABOLIC PANEL WITH GFR - CBC with Differential/Platelet  3. Primary osteoarthritis involving multiple joints Continue on Meloxicam  - meloxicam (MOBIC) 7.5 MG tablet; Take 1 tablet (7.5 mg total) by mouth daily.  Dispense: 30 tablet; Refill: 3  4. Cervical cancer screening Asymptomatic  Request referral for pap smear  - Ambulatory referral to Gynecology  5. Breast cancer screening by mammogram Asymptomatic  - MM DIGITAL SCREENING BILATERAL  Family/ staff Communication: Reviewed plan of care with patient verbalized understanding   Labs/tests ordered:  - MM DIGITAL SCREENING BILATERAL - TSH - COMPLETE METABOLIC PANEL WITH GFR - CBC with Differential/Platelet - Lipid panel  Next Appointment : Return in about 6 months (around 04/29/2023) for medical mangement of chronic issues.Caesar Bookman, NP

## 2022-10-29 LAB — COMPLETE METABOLIC PANEL WITH GFR
AG Ratio: 1.6 (calc) (ref 1.0–2.5)
ALT: 33 U/L — ABNORMAL HIGH (ref 6–29)
AST: 23 U/L (ref 10–35)
Albumin: 4.6 g/dL (ref 3.6–5.1)
Alkaline phosphatase (APISO): 70 U/L (ref 37–153)
BUN: 24 mg/dL (ref 7–25)
CO2: 26 mmol/L (ref 20–32)
Calcium: 10.3 mg/dL (ref 8.6–10.4)
Chloride: 103 mmol/L (ref 98–110)
Creat: 0.94 mg/dL (ref 0.50–1.03)
Globulin: 2.9 g/dL (calc) (ref 1.9–3.7)
Glucose, Bld: 87 mg/dL (ref 65–99)
Potassium: 4.2 mmol/L (ref 3.5–5.3)
Sodium: 138 mmol/L (ref 135–146)
Total Bilirubin: 0.4 mg/dL (ref 0.2–1.2)
Total Protein: 7.5 g/dL (ref 6.1–8.1)
eGFR: 71 mL/min/{1.73_m2} (ref >=60)

## 2022-10-29 LAB — TSH: TSH: 3.56 mIU/L (ref 0.40–4.50)

## 2022-10-29 LAB — CBC WITH DIFFERENTIAL/PLATELET
Basophils Absolute: 33 cells/uL (ref 0–200)
Basophils Relative: 0.5 %
Eosinophils Absolute: 72 cells/uL (ref 15–500)
HCT: 36.9 % (ref 35.0–45.0)
Lymphs Abs: 1476 cells/uL (ref 850–3900)
MCH: 31.9 pg (ref 27.0–33.0)
MCHC: 34.1 g/dL (ref 32.0–36.0)
MCV: 93.4 fL (ref 80.0–100.0)
MPV: 9.2 fL (ref 7.5–12.5)
Monocytes Relative: 9.4 %
Neutrophils Relative %: 66.3 %
RDW: 11.9 % (ref 11.0–15.0)
Total Lymphocyte: 22.7 %
WBC: 6.5 10*3/uL (ref 3.8–10.8)

## 2022-10-29 LAB — LIPID PANEL
Cholesterol: 296 mg/dL — ABNORMAL HIGH (ref <200)
HDL: 76 mg/dL (ref >=50)
LDL Cholesterol (Calc): 200 mg/dL (calc) — ABNORMAL HIGH
Non-HDL Cholesterol (Calc): 220 mg/dL (calc) — ABNORMAL HIGH (ref <130)
Total CHOL/HDL Ratio: 3.9 (calc) (ref <5.0)
Triglycerides: 85 mg/dL (ref <150)

## 2022-10-30 ENCOUNTER — Telehealth: Payer: Self-pay

## 2022-10-30 ENCOUNTER — Other Ambulatory Visit: Payer: Self-pay

## 2022-10-30 DIAGNOSIS — E782 Mixed hyperlipidemia: Secondary | ICD-10-CM

## 2022-10-30 MED ORDER — ATORVASTATIN CALCIUM 20 MG PO TABS: 20.0000 mg | ORAL_TABLET | Freq: Every day | ORAL | 1 refills | Status: DC

## 2022-10-30 NOTE — Telephone Encounter (Signed)
error 

## 2022-11-02 NOTE — Progress Notes (Signed)
  This encounter was created in error - please disregard. No show 

## 2022-11-03 ENCOUNTER — Ambulatory Visit (AMBULATORY_SURGERY_CENTER): Payer: Managed Care, Other (non HMO) | Admitting: *Deleted

## 2022-11-03 VITALS — Ht <= 58 in | Wt 140.0 lb

## 2022-11-03 DIAGNOSIS — Z800 Family history of malignant neoplasm of digestive organs: Secondary | ICD-10-CM

## 2022-11-03 MED ORDER — PEG 3350-KCL-NA BICARB-NACL 420 G PO SOLR
4000.0000 mL | Freq: Once | ORAL | 0 refills | Status: AC
Start: 2022-11-03 — End: 2022-11-03

## 2022-11-03 NOTE — Progress Notes (Signed)
Pt's name and DOB verified at the beginning of the pre-visit.  Pt denies any difficulty with ambulating,sitting, laying down or rolling side to side Gave both [facility] main # and MD on call # prior to instructions.  No egg or soy allergy known to patient  No issues known to pt with past sedation with any surgeries or procedures Pt denies having issues being intubated Pt has no issues moving head neck or swallowing No FH of Malignant Hyperthermia Pt is not on diet pills Pt is not on home 02  Pt is not on blood thinners  Pt has history with constipation but states she has no issues now Pt is not on dialysis Pt denise any abnormal heart rhythms  Pt denies any upcoming cardiac testing Pt encouraged to use to use Singlecare or Goodrx to reduce cost  Patient's chart reviewed by Cathlyn Parsons CNRA prior to pre-visit and patient appropriate for the [facility].  Pre-visit completed and red dot placed by patient's name on their procedure day (on provider's schedule).  . Visit by phone Pt states weight is 140 lb Instructed pt why it is important to and  to call if they have any changes in health or new medications. Directed them to the # given and on instructions.   Pt states they will.  Instructions reviewed with pt and pt states understanding. Instructed to review again prior to procedure. Pt states they will.  Instructions sent by mail with coupon and by my chart

## 2022-11-11 ENCOUNTER — Encounter: Payer: Self-pay | Admitting: Gastroenterology

## 2022-12-01 ENCOUNTER — Encounter: Payer: Self-pay | Admitting: Gastroenterology

## 2022-12-01 ENCOUNTER — Ambulatory Visit: Payer: Managed Care, Other (non HMO) | Admitting: Gastroenterology

## 2022-12-01 VITALS — BP 104/63 | HR 57 | Temp 98.0°F | Resp 11 | Ht <= 58 in | Wt 140.0 lb

## 2022-12-01 DIAGNOSIS — D122 Benign neoplasm of ascending colon: Secondary | ICD-10-CM

## 2022-12-01 DIAGNOSIS — Z1211 Encounter for screening for malignant neoplasm of colon: Secondary | ICD-10-CM | POA: Diagnosis present

## 2022-12-01 DIAGNOSIS — K635 Polyp of colon: Secondary | ICD-10-CM | POA: Diagnosis not present

## 2022-12-01 DIAGNOSIS — Z800 Family history of malignant neoplasm of digestive organs: Secondary | ICD-10-CM | POA: Diagnosis not present

## 2022-12-01 MED ORDER — SODIUM CHLORIDE 0.9 % IV SOLN
500.0000 mL | Freq: Once | INTRAVENOUS | Status: DC
Start: 2022-12-01 — End: 2022-12-01

## 2022-12-01 NOTE — Progress Notes (Signed)
[facility] History and Physical   Primary Care Physician:  [name], [name], NP   Reason for Procedure:  Family h/o colon cancer  Plan:    Surveillance colonoscopy with possible interventions as needed     HPI: Victoria Floyd is a very pleasant 57 y.o. female here for surveillance colonoscopy. Denies any nausea, vomiting, abdominal pain, melena or bright red blood per rectum  The risks and benefits as well as alternatives of endoscopic procedure(s) have been discussed and reviewed. All questions answered. The patient agrees to proceed.    Past Medical History:  Diagnosis Date   Allergy    Anxiety    Chronic constipation    Chronic kidney disease    Hx of kindey stones   Depression    Hyperlipidemia    Hypertension    hx of   Insomnia     Past Surgical History:  Procedure Laterality Date   ANTERIOR CERVICAL DECOMP/DISCECTOMY FUSION N/A 07/12/2014   Procedure: ANTERIOR CERVICAL DECOMPRESSION/DISCECTOMY FUSION 2 LEVELS;  Surgeon: [name], MD;  Location: [facility] OR;  Service: Orthopedics;  Laterality: N/A;  Anterior cervical decompression fusion, cervical 5-6, cervical 6-7 with instrumentation, allograft   BUNIONECTOMY Bilateral    CERVICAL DISC SURGERY  07/12/2014   C5 C6 C7    DR [name]   TUBAL LIGATION     WISDOM TOOTH EXTRACTION      Prior to Admission medications   Medication Sig Start Date End Date Taking? Authorizing Provider  ALPRAZolam Prudy Feeler) 1 MG tablet TAKE 1/2 TO 1 TABLET BY MOUTH TWICE DAILY AS NEEDED 09/09/22  Yes Corie Chiquito, PMHNP  Aspirin-Acetaminophen-Caffeine (GOODY HEADACHE PO) Take by mouth.   Yes [provider]  atorvastatin (LIPITOR) 20 MG tablet Take 1 tablet (20 mg total) by mouth daily. 10/30/22  Yes [name], Dinah C, NP  Biotin 1 MG CAPS Take by mouth.   Yes [provider]  buPROPion (WELLBUTRIN XL) 150 MG 24 hr tablet Take 1 tablet by mouth daily. TAKE WITH A 300 MG TABLET TO EQUAL TOTAL DOSE OF  450 MG 09/09/22  Yes Corie Chiquito, PMHNP  gabapentin (NEURONTIN) 600 MG tablet Take 1 tablet (600 mg total) by mouth 3 (three) times daily. 09/09/22  Yes Corie Chiquito, PMHNP  meloxicam (MOBIC) 7.5 MG tablet Take 1 tablet (7.5 mg total) by mouth daily. 10/28/22  Yes [name], Dinah C, NP  methocarbamol (ROBAXIN) 500 MG tablet TAKE 2 TABLETS BY MOUTH TWICE DAILY FOR MUSCLE SPAMS 04/28/22  Yes Sharon Seller, NP  metoprolol tartrate (LOPRESSOR) 100 MG tablet 2 tablets by mouth daily. NEED APPT BEFORE ANY ADDITIONAL REFILLS 10/28/22  Yes [name], Dinah C, NP  Multiple Vitamin (MULTIVITAMIN) tablet Take 1 tablet by mouth daily.   Yes [provider]  zolpidem (AMBIEN) 5 MG tablet Take 1 tablet (5 mg total) by mouth at bedtime. 10/03/22  Yes Corie Chiquito, PMHNP  buPROPion (WELLBUTRIN XL) 300 MG 24 hr tablet Take 1 tablet (300 mg total) by mouth daily. TAKE WITH A 150 MG TABLET TO EQUAL TOTAL DOSE OF 450 MG Patient not taking: Reported on 11/03/2022 09/09/22   Corie Chiquito, PMHNP  fluticasone [facility]) 50 MCG/ACT nasal spray Place 2 sprays into both nostrils daily as needed for allergies or rhinitis. 02/11/22   Sharon Seller, NP    Current Outpatient Medications  Medication Sig Dispense Refill   ALPRAZolam (XANAX) 1 MG tablet TAKE 1/2 TO 1 TABLET BY MOUTH TWICE DAILY AS NEEDED 45 tablet 5  Aspirin-Acetaminophen-Caffeine (GOODY HEADACHE PO) Take by mouth.     atorvastatin (LIPITOR) 20 MG tablet Take 1 tablet (20 mg total) by mouth daily. 90 tablet 1   Biotin 1 MG CAPS Take by mouth.     buPROPion (WELLBUTRIN XL) 150 MG 24 hr tablet Take 1 tablet by mouth daily. TAKE WITH A 300 MG TABLET TO EQUAL TOTAL DOSE OF 450 MG 90 tablet 1   gabapentin (NEURONTIN) 600 MG tablet Take 1 tablet (600 mg total) by mouth 3 (three) times daily. 270 tablet 1   meloxicam (MOBIC) 7.5 MG tablet Take 1 tablet (7.5 mg total) by mouth daily. 30 tablet 3   methocarbamol (ROBAXIN) 500 MG tablet TAKE 2 TABLETS BY  MOUTH TWICE DAILY FOR MUSCLE SPAMS 120 tablet 11   metoprolol tartrate (LOPRESSOR) 100 MG tablet 2 tablets by mouth daily. NEED APPT BEFORE ANY ADDITIONAL REFILLS 180 tablet 1   Multiple Vitamin (MULTIVITAMIN) tablet Take 1 tablet by mouth daily.     zolpidem (AMBIEN) 5 MG tablet Take 1 tablet (5 mg total) by mouth at bedtime. 30 tablet 5   buPROPion (WELLBUTRIN XL) 300 MG 24 hr tablet Take 1 tablet (300 mg total) by mouth daily. TAKE WITH A 150 MG TABLET TO EQUAL TOTAL DOSE OF 450 MG (Patient not taking: Reported on 11/03/2022) 90 tablet 1   fluticasone (FLONASE) 50 MCG/ACT nasal spray Place 2 sprays into both nostrils daily as needed for allergies or rhinitis. 16 g 11   Current Facility-Administered Medications  Medication Dose Route Frequency Provider Last Rate Last Admin   0.9 %  sodium chloride infusion  500 mL Intravenous Once Napoleon Form, MD        Allergies as of 12/01/2022 - Review Complete 12/01/2022  Allergen Reaction Noted   Codeine Itching 07/25/2013    Family History  Problem Relation Age of Onset   Depression Mother    Anxiety disorder Mother    Colon polyps Mother    Colon cancer Father 36       passed away age 19    Alcohol abuse Daughter    Suicidality Daughter    Esophageal cancer Neg Hx    Rectal cancer Neg Hx    Stomach cancer Neg Hx    Breast cancer Neg Hx     Social History   Socioeconomic History   Marital status: Single    Spouse name: Not on file   Number of children: 2   Years of education: Not on file   Highest education level: Associate degree: occupational, Scientist, product/process development, or vocational program  Occupational History    Comment: full time  Tobacco Use   Smoking status: Former    Years: 15    Types: Cigarettes    Quit date: 12/06/2007    Years since quitting: 14.9   Smokeless tobacco: Never   Tobacco comments:    Quit age 47   Vaping Use   Vaping Use: Former  Substance and Sexual Activity   Alcohol use: Not Currently   Drug use: No    Sexual activity: Yes  Other Topics Concern   Not on file  Social History Narrative   DIET: I eat healthy       DO YOU DRINK/EAT THINGS WITH CAFFEINE: Yes      MARITAL STATUS: Divorced      WHAT YEAR WERE YOU MARRIED: 1992      DO YOU LIVE IN A HOUSE, APARTMENT, ASSISTED LIVING, CONDO TRAILER ETC.:  House  IS IT ONE OR MORE STORIES: Yes      HOW MANY PERSONS LIVE IN YOUR HOME: 1      DO YOU HAVE PETS IN YOUR HOME: 2 cats      CURRENT OR PAST PROFESSION: machinist/ EMT      DO YOU EXERCISE: yes      WHAT TYPE AND HOW OFTEN: occasionally   Social Determinants of Health   Financial Resource Strain: Not on file  Food Insecurity: No Food Insecurity (12/05/2017)   Hunger Vital Sign    Worried About Running Out of Food in the Last Year: Never true    Ran Out of Food in the Last Year: Never true  Transportation Needs: No Transportation Needs (12/05/2017)   PRAPARE - Administrator, Civil Service (Medical): No    Lack of Transportation (Non-Medical): No  Physical Activity: Inactive (12/05/2017)   Exercise Vital Sign    Days of Exercise per Week: 0 days    Minutes of Exercise per Session: 0 min  Stress: Stress Concern Present (12/05/2017)   [employer] of Occupational Health - Occupational Stress Questionnaire    Feeling of Stress : To some extent  Social Connections: Moderately Isolated (12/05/2017)   Social Connection and Isolation Panel [NHANES]    Frequency of Communication with Friends and Family: More than three times a week    Frequency of Social Gatherings with Friends and Family: Once a week    Attends Religious Services: Never    Database administrator or Organizations: No    Attends Banker Meetings: Never    Marital Status: Divorced  Catering manager Violence: Not At Risk (12/05/2017)   Humiliation, Afraid, Rape, and Kick questionnaire    Fear of Current or Ex-Partner: No    Emotionally Abused: No    Physically Abused: No     Sexually Abused: No    Review of Systems:  All other review of systems negative except as mentioned in the HPI.  Physical Exam: Vital signs in last 24 hours: Blood Pressure 114/73   Pulse 66   Temperature 98 F (36.7 C) (Skin)   Respiration 14   Height 4\' 9"  (1.448 m)   Weight 140 lb (63.5 kg)   Last Menstrual Period 07/12/2014 Comment: Tubal Ligation and Neg Preg test  Oxygen Saturation 98%   Body Mass Index 30.30 kg/m  General:   Alert, NAD Lungs:  Clear .   Heart:  Regular rate and rhythm Abdomen:  Soft, nontender and nondistended. Neuro/Psych:  Alert and cooperative. Normal mood and affect. A and O x 3  Reviewed labs, radiology imaging, old records and pertinent past GI work up  Patient is appropriate for planned procedure(s) and anesthesia in an ambulatory setting   K. Scherry Ran , MD [phone]

## 2022-12-01 NOTE — Op Note (Signed)
[facility] Patient Name: Victoria Floyd Procedure Date: 12/01/2022 2:35 PM MRN: [id number] Endoscopist: Napoleon Form , MD, [id number] Age: 57 Referring MD:  Date of Birth: 04-Jan-1966 Gender: Female Account #: [id number] Procedure:                Colonoscopy Indications:              Screening in patient at increased risk: Family                            history of 1st-degree relative with colorectal                            cancer Medicines:                Monitored Anesthesia Care Procedure:                Pre-Anesthesia Assessment:                           - Prior to the procedure, a History and Physical                            was performed, and patient medications and                            allergies were reviewed. The patient's tolerance of                            previous anesthesia was also reviewed. The risks                            and benefits of the procedure and the sedation                            options and risks were discussed with the patient.                            All questions were answered, and informed consent                            was obtained. Prior Anticoagulants: The patient has                            taken no anticoagulant or antiplatelet agents. ASA                            Grade Assessment: II - A patient with mild systemic                            disease. After reviewing the risks and benefits,                            the patient was deemed in satisfactory condition to  undergo the procedure.                           After obtaining informed consent, the colonoscope                            was passed under direct vision. Throughout the                            procedure, the patient's blood pressure, pulse, and                            oxygen saturations were monitored continuously. The                            Olympus Scope [id number] was introduced through the                             anus and advanced to the the cecum, identified by                            appendiceal orifice and ileocecal valve. The                            colonoscopy was performed without difficulty. The                            patient tolerated the procedure well. The quality                            of the bowel preparation was good. The ileocecal                            valve, appendiceal orifice, and rectum were                            photographed. Scope In: 2:47:20 PM Scope Out: 3:02:49 PM Scope Withdrawal Time: 0 hours 8 minutes 55 seconds  Total Procedure Duration: 0 hours 15 minutes 29 seconds  Findings:                 The perianal and digital rectal examinations were                            normal.                           A 5 mm polyp was found in the ascending colon. The                            polyp was sessile. The polyp was removed with a                            cold snare. Resection and retrieval were complete.  A few small-mouthed diverticula were found in the                            sigmoid colon, descending colon, transverse colon                            and ascending colon.                           External and internal hemorrhoids were found during                            retroflexion. The hemorrhoids were medium-sized. Complications:            No immediate complications. Estimated Blood Loss:     Estimated blood loss was minimal. Impression:               - One 5 mm polyp in the ascending colon, removed                            with a cold snare. Resected and retrieved.                           - Diverticulosis in the sigmoid colon, in the                            descending colon, in the transverse colon and in                            the ascending colon.                           - External and internal hemorrhoids. Recommendation:           - Patient has a contact number  available for                            emergencies. The signs and symptoms of potential                            delayed complications were discussed with the                            patient. Return to normal activities tomorrow.                            Written discharge instructions were provided to the                            patient.                           - Resume previous diet.                           - Continue present medications.                           -  Await pathology results.                           - Repeat colonoscopy in 5 years for surveillance                            based on pathology results. Napoleon Form, MD 12/01/2022 3:08:25 PM This report has been signed electronically.

## 2022-12-01 NOTE — Progress Notes (Signed)
Pt resting comfortably. VSS. Airway intact. SBAR complete to RN. All questions answered.   

## 2022-12-01 NOTE — Patient Instructions (Signed)
Handouts provided on polyps, diverticulosis and hemorrhoids.   Resume previous diet. Continue present medications.  Await pathology results.  Repeat colonoscopy in 5 years for surveillance based on pathology results.   YOU HAD AN ENDOSCOPIC PROCEDURE TODAY AT THE [facility]:   Refer to the procedure report that was given to you for any specific questions about what was found during the examination.  If the procedure report does not answer your questions, please call your gastroenterologist to clarify.  If you requested that your care partner not be given the details of your procedure findings, then the procedure report has been included in a sealed envelope for you to review at your convenience later.  YOU SHOULD EXPECT: Some feelings of bloating in the abdomen. Passage of more gas than usual.  Walking can help get rid of the air that was put into your GI tract during the procedure and reduce the bloating. If you had a lower endoscopy (such as a colonoscopy or flexible sigmoidoscopy) you may notice spotting of blood in your stool or on the toilet paper. If you underwent a bowel prep for your procedure, you may not have a normal bowel movement for a few days.  Please Note:  You might notice some irritation and congestion in your nose or some drainage.  This is from the oxygen used during your procedure.  There is no need for concern and it should clear up in a day or so.  SYMPTOMS TO REPORT IMMEDIATELY:  Following lower endoscopy (colonoscopy or flexible sigmoidoscopy):  Excessive amounts of blood in the stool  Significant tenderness or worsening of abdominal pains  Swelling of the abdomen that is new, acute  Fever of 100F or higher  For urgent or emergent issues, a gastroenterologist can be reached at any hour by calling [phone]. Do not use MyChart messaging for urgent concerns.    DIET:  We do recommend a small meal at first, but then you may proceed to your regular  diet.  Drink plenty of fluids but you should avoid alcoholic beverages for 24 hours.  ACTIVITY:  You should plan to take it easy for the rest of today and you should NOT DRIVE or use heavy machinery until tomorrow (because of the sedation medicines used during the test).    FOLLOW UP: Our staff will call the number listed on your records the next business day following your procedure.  We will call around 7:15- 8:00 am to check on you and address any questions or concerns that you may have regarding the information given to you following your procedure. If we do not reach you, we will leave a message.     If any biopsies were taken you will be contacted by phone or by letter within the next 1-3 weeks.  Please call us at [phone] if you have not heard about the biopsies in 3 weeks.    SIGNATURES/CONFIDENTIALITY: You and/or your care partner have signed paperwork which will be entered into your electronic medical record.  These signatures attest to the fact that that the information above on your After Visit Summary has been reviewed and is understood.  Full responsibility of the confidentiality of this discharge information lies with you and/or your care-partner.  

## 2022-12-01 NOTE — Progress Notes (Signed)
Called to room to assist during endoscopic procedure.  Patient ID and intended procedure confirmed with present staff. Received instructions for my participation in the procedure from the performing physician.  

## 2022-12-01 NOTE — Progress Notes (Signed)
Pt's states no medical or surgical changes since previsit or office visit. 

## 2022-12-02 ENCOUNTER — Telehealth: Payer: Self-pay | Admitting: *Deleted

## 2022-12-02 NOTE — Telephone Encounter (Signed)
  Follow up Call-     12/01/2022    2:08 PM  Call back number  Post procedure Call Back phone  # [phone]  Permission to leave phone message Yes     Patient questions:   Message left to call us if necessary.

## 2022-12-03 ENCOUNTER — Telehealth: Payer: Self-pay | Admitting: Gastroenterology

## 2022-12-03 NOTE — Telephone Encounter (Signed)
Patient called states she had a failed IV attempt on her right hand from the procedure she had on 12/01/22 and is now its bruised\swollen and painful.

## 2022-12-03 NOTE — Telephone Encounter (Signed)
Agree with the plan.  Thank you.

## 2022-12-03 NOTE — Telephone Encounter (Signed)
Returned pts call.  She states that her right hand where she had a failed IV attempt is painful, swollen and bruised.  She states that she has been icing it but it has not helped.  I advised her to try a warm compress tonight and to use Tylenol for pain.  Pt understands instructions.  She is to call back should it not improve.

## 2022-12-04 ENCOUNTER — Encounter: Payer: Self-pay | Admitting: Gastroenterology

## 2022-12-10 ENCOUNTER — Ambulatory Visit: Payer: 59 | Admitting: Psychiatry

## 2022-12-29 ENCOUNTER — Telehealth: Payer: Managed Care, Other (non HMO) | Admitting: Adult Health

## 2022-12-30 ENCOUNTER — Telehealth (INDEPENDENT_AMBULATORY_CARE_PROVIDER_SITE_OTHER): Payer: Managed Care, Other (non HMO) | Admitting: Orthopedic Surgery

## 2022-12-30 ENCOUNTER — Telehealth: Payer: Managed Care, Other (non HMO) | Admitting: Family

## 2022-12-30 ENCOUNTER — Telehealth: Payer: Self-pay

## 2022-12-30 ENCOUNTER — Encounter: Payer: Self-pay | Admitting: Orthopedic Surgery

## 2022-12-30 DIAGNOSIS — U071 COVID-19: Secondary | ICD-10-CM | POA: Diagnosis not present

## 2022-12-30 MED ORDER — NIRMATRELVIR/RITONAVIR (PAXLOVID)TABLET
3.0000 | ORAL_TABLET | Freq: Two times a day (BID) | ORAL | 0 refills | Status: AC
Start: 2022-12-30 — End: 2023-01-04

## 2022-12-30 MED ORDER — NIRMATRELVIR/RITONAVIR (PAXLOVID)TABLET
3.0000 | ORAL_TABLET | Freq: Two times a day (BID) | ORAL | 0 refills | Status: DC
Start: 2022-12-30 — End: 2022-12-30

## 2022-12-30 NOTE — Telephone Encounter (Signed)
Treatment plan discussed with mother, via telephone.

## 2022-12-30 NOTE — Progress Notes (Signed)
Careteam: Patient Care Team: [name], [name], NP as PCP - General (Family Medicine) Pyrtle, [name], MD as Consulting Physician (Gastroenterology)  Seen by: Hazle Nordmann, AGNP-C  PLACE OF SERVICE:  [facility]  Advanced Directive information Does Patient Have a Medical Advance Directive?: No, Would patient like information on creating a medical advance directive?: No - Patient declined  Allergies  Allergen Reactions   Codeine Itching    Chief Complaint  Patient presents with   Acute Visit    Patient is Covid +. Patient symptoms are coughing, nasal congestion, fever/chills, and body aches.      HPI: Patient is a 57 y.o. female seen today via video visit due to recent positive covid.   08/05 home test for covid was positive. Symptoms include productive cough, sore throat, headaches and fever. She reports fever of 103. She has been taking tylenol 650 mg prn and fever has improved < 99 degrees. She denies being around sick persons. She had J&J covid vaccine, but no booster vaccinations. Denies chest pain, shortness of breath, sinus/ear pain.       Review of Systems:  Review of Systems  Constitutional:  Positive for chills, fever and malaise/fatigue.  HENT:  Positive for congestion and sore throat. Negative for ear pain.   Respiratory:  Positive for cough and sputum production. Negative for shortness of breath and wheezing.   Cardiovascular:  Negative for chest pain.  Musculoskeletal:  Positive for myalgias.  Neurological:  Positive for headaches.  Psychiatric/Behavioral:  Negative for depression. The patient is not nervous/anxious.     Past Medical History:  Diagnosis Date   Allergy    Anxiety    Chronic constipation    Chronic kidney disease    Hx of kindey stones   Depression    Hyperlipidemia    Hypertension    hx of   Insomnia    Past Surgical History:  Procedure Laterality Date   ANTERIOR CERVICAL DECOMP/DISCECTOMY FUSION N/A 07/12/2014   Procedure:  ANTERIOR CERVICAL DECOMPRESSION/DISCECTOMY FUSION 2 LEVELS;  Surgeon: [name], MD;  Location: [facility] OR;  Service: Orthopedics;  Laterality: N/A;  Anterior cervical decompression fusion, cervical 5-6, cervical 6-7 with instrumentation, allograft   BUNIONECTOMY Bilateral    CERVICAL DISC SURGERY  07/12/2014   C5 C6 C7    DR [name]   TUBAL LIGATION     WISDOM TOOTH EXTRACTION     Social History:   reports that she quit smoking about 15 years ago. Her smoking use included cigarettes. She started smoking about 30 years ago. She has never used smokeless tobacco. She reports that she does not currently use alcohol. She reports that she does not use drugs.  Family History  Problem Relation Age of Onset   Depression Mother    Anxiety disorder Mother    Colon polyps Mother    Colon cancer Father 52       passed away age 23    Alcohol abuse Daughter    Suicidality Daughter    Esophageal cancer Neg Hx    Rectal cancer Neg Hx    Stomach cancer Neg Hx    Breast cancer Neg Hx     Medications: Patient's Medications  New Prescriptions   No medications on file  Previous Medications   ALPRAZOLAM (XANAX) 1 MG TABLET    TAKE 1/2 TO 1 TABLET BY MOUTH TWICE DAILY AS NEEDED   ASPIRIN-ACETAMINOPHEN-CAFFEINE (GOODY HEADACHE PO)    Take by mouth.   ATORVASTATIN (LIPITOR)  20 MG TABLET    Take 1 tablet (20 mg total) by mouth daily.   BIOTIN 1 MG CAPS    Take by mouth.   BUPROPION (WELLBUTRIN XL) 150 MG 24 HR TABLET    Take 1 tablet by mouth daily. TAKE WITH A 300 MG TABLET TO EQUAL TOTAL DOSE OF 450 MG   BUPROPION (WELLBUTRIN XL) 300 MG 24 HR TABLET    Take 1 tablet (300 mg total) by mouth daily. TAKE WITH A 150 MG TABLET TO EQUAL TOTAL DOSE OF 450 MG   FLUTICASONE (FLONASE) 50 MCG/ACT NASAL SPRAY    Place 2 sprays into both nostrils daily as needed for allergies or rhinitis.   GABAPENTIN (NEURONTIN) 600 MG TABLET    Take 1 tablet (600 mg total) by mouth 3 (three) times daily.   MELOXICAM  (MOBIC) 7.5 MG TABLET    Take 1 tablet (7.5 mg total) by mouth daily.   METHOCARBAMOL (ROBAXIN) 500 MG TABLET    TAKE 2 TABLETS BY MOUTH TWICE DAILY FOR MUSCLE SPAMS   METOPROLOL TARTRATE (LOPRESSOR) 100 MG TABLET    2 tablets by mouth daily. NEED APPT BEFORE ANY ADDITIONAL REFILLS   MULTIPLE VITAMIN (MULTIVITAMIN) TABLET    Take 1 tablet by mouth daily.   ZOLPIDEM (AMBIEN) 5 MG TABLET    Take 1 tablet (5 mg total) by mouth at bedtime.  Modified Medications   No medications on file  Discontinued Medications   No medications on file    Physical Exam:  There were no vitals filed for this visit. There is no height or weight on file to calculate BMI. Wt Readings from Last 3 Encounters:  12/01/22 140 lb (63.5 kg)  11/03/22 140 lb (63.5 kg)  10/28/22 143 lb 9.6 oz (65.1 kg)    Physical Exam Vitals (exam limited due to video visit) reviewed.  Constitutional:      General: She is not in acute distress. Neurological:     Mental Status: She is alert.     Labs reviewed: Basic Metabolic Panel: Recent Labs    10/28/22 1618  NA 138  K 4.2  CL 103  CO2 26  GLUCOSE 87  BUN 24  CREATININE 0.94  CALCIUM 10.3  TSH 3.56   Liver Function Tests: Recent Labs    10/28/22 1618  AST 23  ALT 33*  BILITOT 0.4  PROT 7.5   No results for input(s): "LIPASE", "AMYLASE" in the last 8760 hours. No results for input(s): "AMMONIA" in the last 8760 hours. CBC: Recent Labs    10/28/22 1618  WBC 6.5  NEUTROABS 4,310  HGB 12.6  HCT 36.9  MCV 93.4  PLT 307   Lipid Panel: Recent Labs    10/28/22 1618  CHOL 296*  HDL 76  LDLCALC 200*  TRIG 85  CHOLHDL 3.9   TSH: Recent Labs    10/28/22 1618  TSH 3.56   A1C: No results found for: "HGBA1C"   Assessment/Plan 1. COVID-19 - 08/05 home test positive - symptoms: productive cough, fever > 103, fatigue, sore throat, body aches - will start Paxlovid - recommend tylenol 1000 mg po daily x 7 days - recommend Zinc 50 mg po daily  x 7 days - encourage hydration with water/fluids - encourage rest - contact PCP if symptoms do not improve or worsen - nirmatrelvir/ritonavir (PAXLOVID) 20 x 150 MG & 10 x 100MG  TABS; Take 3 tablets by mouth 2 (two) times daily for 5 days. (Take nirmatrelvir 150 mg two  tablets twice daily for 5 days and ritonavir 100 mg one tablet twice daily for 5 days) Patient GFR is 71.  Dispense: 30 tablet; Refill: 0  Virtual Visit   I connected with Velvet Bathe vis virtual visit and verified that I am speaking with the correct person using two identifiers.  Location:[facility] Patient: Borghild Widder Provider: Octavia Heir, NP    I discussed the limitations, risks, security and privacy concerns of performing an evaluation and management service by telephone and the availability of in person appointments. I also discussed with the patient that there may be a patient responsible charge related to this service. The patient expressed understanding and agreed to proceed.   I discussed the assessment and treatment plan with the patient. The patient was provided an opportunity to ask questions and all were answered. The patient agreed with the plan and demonstrated an understanding of the instructions.   The patient was advised to call back or seek an in-person evaluation if the symptoms worsen or if the condition fails to improve as anticipated.  I provided 11 minutes of face-to-face time during this encounter.  Santino Kinsella Norval Gable, NP , AGNP Avs printed and mailed    Next appt: Visit date not found  Brantley Naser Scherry Ran  [facility] [phone]

## 2022-12-30 NOTE — Telephone Encounter (Signed)
Patient called stating [facility] told her we did not sent in a medication for covid and we need to call and get this figured out.  I called [facility] and was told rx was received, yet out of stock and the patient misinterpreted what they were saying.  I called patient back and she requested rx go to [facility]. RX was submitted as requested

## 2022-12-30 NOTE — Progress Notes (Signed)
   This service is provided via telemedicine  No vital signs collected/recorded due to the encounter was a telemedicine visit.   Location of patient (ex: home, work):  Home.  Patient consents to a telephone visit:  Yes  Location of the provider (ex: office, home):  [employer].  Name of any referring provider:  [name], Nelda Bucks, NP   Names of all persons participating in the telemedicine service and their role in the encounter:  Patient, Victoria Floyd, [place], Victoria Moulding, NP.    Time spent on call:  8 minutes spent on the phone with Medical Assistant.

## 2022-12-30 NOTE — Patient Instructions (Signed)
-   will start Paxlovid - recommend tylenol 1000 mg by mouth daily x 7 days - recommend Zinc 50 mg by mouth daily x 7 days - encourage hydration with water/fluids - encourage rest - contact PCP if symptoms do not improve or worsen

## 2022-12-30 NOTE — Telephone Encounter (Signed)
Patients mother called and stated she is very concerned about her daughter and would like for the provider that did a video visit with her to call.   I asked if she could provide more information as to what her specific concerns were and she asked what were we doing to help her daughter? I discussed Victoria's treatment plan and recommendations. Ms.Floyd then stated she would appreciate it if she could speak with the provider.

## 2023-01-07 ENCOUNTER — Ambulatory Visit: Payer: 59 | Admitting: Psychiatry

## 2023-01-14 ENCOUNTER — Ambulatory Visit (INDEPENDENT_AMBULATORY_CARE_PROVIDER_SITE_OTHER): Payer: Self-pay | Admitting: Psychiatry

## 2023-01-14 DIAGNOSIS — F4310 Post-traumatic stress disorder, unspecified: Secondary | ICD-10-CM

## 2023-01-14 NOTE — Progress Notes (Signed)
      [facility] Counselor/Therapist Progress Note  Patient ID: Victoria Floyd, MRN: [id number],    Date: 01/14/2023  Time Spent: 60 minutes start time 4:04 PM and time 5:04 PM  Treatment Type: Individual Therapy  Reported Symptoms: nightmares, flashbacks, anxiety, sadness, crying spells, migraines  Mental Status Exam:  Appearance:   Casual     Behavior:  Appropriate  Motor:  Normal  Speech/Language:   Normal Rate  Affect:  Appropriate and Tearful  Mood:  sad  Thought process:  normal  Thought content:    WNL  Sensory/Perceptual disturbances:    WNL  Orientation:  oriented to person, place, time/date, and situation  Attention:  Good  Concentration:  Good  Memory:  WNL  Fund of knowledge:   Good  Insight:    Good  Judgment:   Good  Impulse Control:  Good   Risk Assessment: Danger to Self:  No Self-injurious Behavior: No Danger to Others: No Duty to Warn:no Physical Aggression / Violence:No  Access to Firearms a concern: No  Gang Involvement:No   Subjective: Patient was worked in for a session. She shared that she had a bad nightmare that she has triggered flashbacks and it has made it hard for her to be in her house. She shared she has been working a lot. She shared her son retired from [employer] in May and she was able to see them. She went on to share that her mother and she went to see her family for Thanksgiving but she shared it brought up bad memories for her. She went on to share she was in the hospital for a for a week due to being septic due to impacted kidney stones.  Developed treatment plan and set goals for session.  Patient went on to share she needed to work on the nightmare of her daughter, SUDS level 10, negative cognition "I'm never going to be right", felt sadness and panic in her chest. She was able to reduce SUDS level to 5. Discussed trying visualizations at home to manage emotions and flashbacks when they surface.  Developed treatment plan and goal  in session.    Interventions: Solution-Oriented/Positive Psychology, Eye Movement Desensitization and Reprocessing (EMDR), and Insight-Oriented  Diagnosis:   ICD-10-CM   1. PTSD (post-traumatic stress disorder)  F43.10       Plan:  Patient is to practice coping skills discussed in session including grounding and 5, counting backwards from 100 by twos or threes, and brain spotting exercise vergence.  Patient is to follow plans from session t to practice visual from session when flashbacks occur.  The patient is to take medication as directed.  Patient is to try doing a brain dump sort of journaling prior to sleep. Long-term goal: Develop and implement effective coping skills to carry out normal responsibilities and participate constructively in relationships Short-term goal: Sleep without being disturbed by dreams of trauma: Verbalize hopeful and positive statements regarding the future  Stevphen Meuse, [facility]

## 2023-02-04 ENCOUNTER — Ambulatory Visit: Payer: 59 | Admitting: Psychiatry

## 2023-02-23 ENCOUNTER — Other Ambulatory Visit: Payer: Self-pay | Admitting: Psychiatry

## 2023-02-23 DIAGNOSIS — F331 Major depressive disorder, recurrent, moderate: Secondary | ICD-10-CM

## 2023-03-02 ENCOUNTER — Other Ambulatory Visit: Payer: Managed Care, Other (non HMO)

## 2023-03-04 ENCOUNTER — Other Ambulatory Visit: Payer: Self-pay | Admitting: Psychiatry

## 2023-03-04 DIAGNOSIS — F4310 Post-traumatic stress disorder, unspecified: Secondary | ICD-10-CM

## 2023-03-04 DIAGNOSIS — F411 Generalized anxiety disorder: Secondary | ICD-10-CM

## 2023-03-11 ENCOUNTER — Other Ambulatory Visit: Payer: Self-pay | Admitting: Psychiatry

## 2023-03-11 ENCOUNTER — Ambulatory Visit (INDEPENDENT_AMBULATORY_CARE_PROVIDER_SITE_OTHER): Payer: Managed Care, Other (non HMO) | Admitting: Psychiatry

## 2023-03-11 ENCOUNTER — Ambulatory Visit (INDEPENDENT_AMBULATORY_CARE_PROVIDER_SITE_OTHER): Payer: 59 | Admitting: Psychiatry

## 2023-03-11 DIAGNOSIS — F4310 Post-traumatic stress disorder, unspecified: Secondary | ICD-10-CM

## 2023-03-11 DIAGNOSIS — F411 Generalized anxiety disorder: Secondary | ICD-10-CM

## 2023-03-11 DIAGNOSIS — F331 Major depressive disorder, recurrent, moderate: Secondary | ICD-10-CM

## 2023-03-11 NOTE — Progress Notes (Signed)
      [facility] Counselor/Therapist Progress Note  Patient ID: Victoria Floyd, MRN: [id number],    Date: 03/11/2023  Time Spent: 50 minutes start time 10:05 AM end time 10:55 AM  Treatment Type: Individual Therapy  Reported Symptoms: anxiety, grief, focusing, sadness, migraines, loss of joy  Mental Status Exam:  Appearance:   Well Groomed     Behavior:  Appropriate  Motor:  Normal  Speech/Language:   Normal Rate  Affect:  Appropriate  Mood:  anxious  Thought process:  normal  Thought content:    WNL  Sensory/Perceptual disturbances:    WNL  Orientation:  oriented to person, place, time/date, and situation  Attention:  Good  Concentration:  Good  Memory:  WNL  Fund of knowledge:   Good  Insight:    Good  Judgment:   Good  Impulse Control:  Good   Risk Assessment: Danger to Self:  No Self-injurious Behavior: No Danger to Others: No Duty to Warn:no Physical Aggression / Violence:No  Access to Firearms a concern: No  Gang Involvement:No   Subjective: Patient was present for session.  She shared she felt better after last session. She shared she has not been having the flashbacks that she was having.  She explained that she felt last session was very helpful and she was not sure what to talk about in session due to not having the typical flashbacks and triggered responses that she normally has.  Patient went on to share that she has not had the typical happy joy feelings that she has during the fall season.  She explained that she has been working 7 days a week and is not able to accomplish what she typically does.  Had patient think through her thoughts.  Gave her handouts on cognitive distortions and how to and twist them.  Went over handouts with patient and she was able to identify distortions.  Patient was encouraged to start trying to find some things to look forward to since she is seeming to have a hard time finding any joy.  Patient was finally able to agreed to have  her and her mom go stay at a hotel for the night.  She shared that just getting out of the house would give her something to feel good about and at this point her mom is the only 1 that can do anything with her.  She is also going to start considering going to [place] and spending time with family.  Interventions: Cognitive Behavioral Therapy and Solution-Oriented/Positive Psychology  Diagnosis:   ICD-10-CM   1. Major depressive disorder, recurrent episode, moderate (HCC)  F33.1       Plan:   Patient is to practice coping skills discussed in session including grounding and 5, counting backwards from 100 by twos or threes, and brain spotting exercise vergence.  Patient is to follow plans from session  to work on identifying cognitive distortions and managing them.  The patient is to take medication as directed.  Patient is to try doing a brain dump sort of journaling prior to sleep. Long-term goal: Develop and implement effective coping skills to carry out normal responsibilities and participate constructively in relationships Short-term goal: Sleep without being disturbed by dreams of trauma: Verbalize hopeful and positive statements regarding the future  Stevphen Meuse, [facility]

## 2023-03-11 NOTE — Progress Notes (Signed)
Provider was running behind and patient had to leave to return to work. No charge.

## 2023-03-19 ENCOUNTER — Other Ambulatory Visit: Payer: Self-pay | Admitting: Psychiatry

## 2023-03-19 DIAGNOSIS — G4700 Insomnia, unspecified: Secondary | ICD-10-CM

## 2023-03-25 ENCOUNTER — Ambulatory Visit: Payer: Self-pay | Admitting: Psychiatry

## 2023-04-01 ENCOUNTER — Other Ambulatory Visit: Payer: Self-pay | Admitting: Psychiatry

## 2023-04-01 DIAGNOSIS — F4310 Post-traumatic stress disorder, unspecified: Secondary | ICD-10-CM

## 2023-04-01 DIAGNOSIS — F411 Generalized anxiety disorder: Secondary | ICD-10-CM

## 2023-04-08 ENCOUNTER — Encounter: Payer: Self-pay | Admitting: Psychiatry

## 2023-04-08 ENCOUNTER — Telehealth: Payer: Self-pay | Admitting: Psychiatry

## 2023-04-08 ENCOUNTER — Ambulatory Visit (INDEPENDENT_AMBULATORY_CARE_PROVIDER_SITE_OTHER): Payer: Managed Care, Other (non HMO) | Admitting: Psychiatry

## 2023-04-08 DIAGNOSIS — F411 Generalized anxiety disorder: Secondary | ICD-10-CM

## 2023-04-08 DIAGNOSIS — F331 Major depressive disorder, recurrent, moderate: Secondary | ICD-10-CM

## 2023-04-08 DIAGNOSIS — F330 Major depressive disorder, recurrent, mild: Secondary | ICD-10-CM | POA: Diagnosis not present

## 2023-04-08 DIAGNOSIS — F4310 Post-traumatic stress disorder, unspecified: Secondary | ICD-10-CM

## 2023-04-08 MED ORDER — BUPROPION HCL ER (XL) 300 MG PO TB24: 300.0000 mg | ORAL_TABLET | Freq: Every day | ORAL | 0 refills | Status: DC

## 2023-04-08 MED ORDER — BUPROPION HCL ER (XL) 150 MG PO TB24
ORAL_TABLET | ORAL | 0 refills | Status: DC
Start: 2023-04-08 — End: 2023-04-27

## 2023-04-08 MED ORDER — ALPRAZOLAM 1 MG PO TABS
ORAL_TABLET | ORAL | 1 refills | Status: DC
Start: 2023-04-08 — End: 2023-04-27

## 2023-04-08 NOTE — Progress Notes (Signed)
[facility] Counselor/Therapist Progress Note  Patient ID: Victoria Floyd, MRN: [id number],    Date: 04/08/2023  Time Spent: 48 minutes start time 10:02 AM end time 10:50 AM  Treatment Type: Individual Therapy  Reported Symptoms: depressed, focusing issues, flashbacks, rumination, low motivation, fatigue  Mental Status Exam:  Appearance:   Well Groomed     Behavior:  Appropriate  Motor:  Normal  Speech/Language:   Normal Rate  Affect:  Appropriate  Mood:  labile  Thought process:  normal  Thought content:    WNL  Sensory/Perceptual disturbances:    WNL  Orientation:  oriented to person, place, time/date, and situation  Attention:  Good  Concentration:  Good  Memory:  WNL  Fund of knowledge:   Good  Insight:    Good  Judgment:   Good  Impulse Control:  Good   Risk Assessment: Danger to Self:  No Self-injurious Behavior: No Danger to Others: No Duty to Warn:no Physical Aggression / Violence:No  Access to Firearms a concern: No  Gang Involvement:No   Subjective: Patient was present for session. She shared that she is some doing better. She went on to share she is still doing good with her flashbacks and feels that her depression is more of an issue currently. Tried to do a new treatment plan but could not get system to work for session.  Patient reported that she is not going to be getting as much overtime as she has been.  Discussed the fact that that could give her more opportunities to start trying to reconnect with other people rather than isolating.  She was able to recognize that she could go on a trip by herself per night even if her mother did not want to go.  Also encouraged patient to start trying to reach out to others that she has not connected with due to isolation after the loss of her daughter.  Patient was able to recognize there was a neighbor that still tries to communicate with her that she could make some cookies for her over Christmas and she agreed  to do that.  Patient also shared she is going to her sons over Thanksgiving and will communicate with him about the possibility of taking her grandson on a trip.  Patient was encouraged to try and find things that she can do while she has the time off from work so that she can find some joy in her life.  Interventions: Solution-Oriented/Positive Psychology  Diagnosis:   ICD-10-CM   1. MDD (major depressive disorder), recurrent episode, mild (HCC)  F33.0       Plan: Patient is to practice coping skills discussed in session including grounding and 5, counting backwards from 100 by twos or threes, and brain spotting exercise vergence.  Patient is to try and find ways to connect more with others including making cookies for a neighbor and talking to her son about possibly taking her grandson on a trip.  Patient is to continue identifying cognitive distortions and managing them.  The patient is to take medication as directed.  Patient is to try doing a brain dump sort of journaling prior to sleep. Long-term goal: Develop and implement effective coping skills to carry out normal responsibilities and participate constructively in relationships Short-term goal: Sleep without being disturbed by dreams of trauma: Verbalize hopeful and positive statements regarding the future  Stevphen Meuse, [facility]

## 2023-04-08 NOTE — Telephone Encounter (Signed)
Received request for refills. Ambien last filled 03/20/23. Gabapentin last filled 03/19/23. Alprazolam last filled 03/11/23.  Script sent for Xanax and Wellbutrin XL.

## 2023-04-18 ENCOUNTER — Other Ambulatory Visit: Payer: Self-pay | Admitting: Family

## 2023-04-18 DIAGNOSIS — I10 Essential (primary) hypertension: Secondary | ICD-10-CM

## 2023-04-20 NOTE — Telephone Encounter (Signed)
Patient has request refill on the following medications. Patient medications have High Risk Warnings. Medications pend and sent to PCP [name], [name], NP for approval.

## 2023-04-27 ENCOUNTER — Encounter: Payer: Self-pay | Admitting: Psychiatry

## 2023-04-27 ENCOUNTER — Ambulatory Visit (INDEPENDENT_AMBULATORY_CARE_PROVIDER_SITE_OTHER): Payer: Managed Care, Other (non HMO) | Admitting: Psychiatry

## 2023-04-27 VITALS — BP 105/70 | HR 57 | Wt 123.0 lb

## 2023-04-27 DIAGNOSIS — F4310 Post-traumatic stress disorder, unspecified: Secondary | ICD-10-CM | POA: Diagnosis not present

## 2023-04-27 DIAGNOSIS — F411 Generalized anxiety disorder: Secondary | ICD-10-CM | POA: Diagnosis not present

## 2023-04-27 DIAGNOSIS — G4726 Circadian rhythm sleep disorder, shift work type: Secondary | ICD-10-CM | POA: Diagnosis not present

## 2023-04-27 DIAGNOSIS — F331 Major depressive disorder, recurrent, moderate: Secondary | ICD-10-CM

## 2023-04-27 DIAGNOSIS — G4700 Insomnia, unspecified: Secondary | ICD-10-CM

## 2023-04-27 MED ORDER — MODAFINIL 200 MG PO TABS: ORAL_TABLET | ORAL | 5 refills | Status: DC

## 2023-04-27 MED ORDER — BUPROPION HCL ER (XL) 150 MG PO TB24: ORAL_TABLET | ORAL | 1 refills | Status: DC

## 2023-04-27 MED ORDER — ALPRAZOLAM 1 MG PO TABS: ORAL_TABLET | ORAL | 5 refills | Status: DC

## 2023-04-27 MED ORDER — ZOLPIDEM TARTRATE 5 MG PO TABS: 5.0000 mg | ORAL_TABLET | Freq: Every evening | ORAL | 2 refills | Status: DC | PRN

## 2023-04-27 MED ORDER — BUPROPION HCL ER (XL) 300 MG PO TB24: 300.0000 mg | ORAL_TABLET | Freq: Every day | ORAL | 1 refills | Status: DC

## 2023-04-27 NOTE — Progress Notes (Unsigned)
Victoria Floyd [id number] 11-10-1965 57 y.o.  Subjective:   Patient ID:  Victoria Floyd is a 57 y.o. (DOB 05/19/1966) female.  Chief Complaint:  Chief Complaint  Patient presents with  . Other    Low energy and low motivation    HPI Victoria Floyd presents to the office today for follow-up of anxiety, depression, and insomnia. Anxiety has been ok. Denies any recent panic attacks. She reports that she has been able to manage intrusive thoughts and memories. Denies flashbacks. She reports that her mood has been consistent with baseline. Some occasional depression with the holidays. She reports adequate sleeps about 6 hours. She reports that her motivation is somewhat low for things around the house. She will occasionally put off things. Energy is fair. Appetite has been ok. She reports that she has lost about 20 lbs. She reports that her concentration has been adequate. "My mind wanders a lot." Denies SI.   Work has been going ok. Enjoys her job. Visited son in [place], [place] over Thanksgiving. She works 10-12 hours a day, sometimes 7 days a week. Goes to see her mother on Saturdays and they go to the movies.   Ambien last filled 04/17/23 x2. Gabapentin last filled 04/16/23 x 2. Alprazolam last filled 04/08/23.  Past Psychiatric Medication Trials: Ambien- Able to fall asleep. Sometimes still had dreams. Took for about 20 years. Xanax- Prescribed two 1 mg tabs for about 20 years. Effexor- Sleepy Zoloft- Has increased anxiety  Prozac Lexapro Cymbalta- initially seemed to be helpful and then developed trembling Wellbutrin XL- Reports that this has been helpful for her mood and hot flashes. Gabapentin-Prescribed for pain. Trazodone-Increased anxiety, nausea  PHQ2-9    Flowsheet Row Office Visit from 10/28/2022 in [facility] Office Visit from 12/09/2021 in [facility] Office Visit from 06/22/2018 in [facility] Office Visit from 04/07/2017 [facility] Office Visit from 04/04/2016 in [facility]  PHQ-2 Total Score 0 0 0 0 0        Review of Systems:  Review of Systems  Gastrointestinal: Negative.   Musculoskeletal:  Negative for gait problem.  Neurological:  Negative for tremors.       Occ headaches  Psychiatric/Behavioral:         Please refer to HPI    Medications: I have reviewed the patient's current medications.  Current Outpatient Medications  Medication Sig Dispense Refill  . ALPRAZolam (XANAX) 1 MG tablet TAKE 1/2 TO 1 TABLET BY MOUTH TWICE DAILY AS NEEDED 45 tablet 1  . Aspirin-Acetaminophen-Caffeine (GOODY HEADACHE PO) Take by mouth.    Marland Kitchen atorvastatin (LIPITOR) 20 MG tablet Take 1 tablet by mouth daily. 90 tablet 1  . Biotin 1 MG CAPS Take by mouth.    Marland Kitchen buPROPion (WELLBUTRIN XL) 150 MG 24 hr tablet TAKE ONE TABLET BY MOUTH DAILY TAKE WITH 300MG  TO EQUAL A TOTAL DOSE OF 450MG  90 tablet 0  . buPROPion (WELLBUTRIN XL) 300 MG 24 hr tablet Take 1 tablet (300 mg total) by mouth daily. 90 tablet 0  . fluticasone (FLONASE) 50 MCG/ACT nasal spray Place 2 sprays into both nostrils daily as needed for allergies or rhinitis. 16 g 11  . gabapentin (NEURONTIN) 600 MG tablet TAKE ONE TABLET BY MOUTH THREE TIMES DAILY 270 tablet 1  . metoprolol tartrate (LOPRESSOR) 100  MG tablet TAKE TWO TABLETS BY MOUTH DAILY 180 tablet 1  . Multiple Vitamin (MULTIVITAMIN) tablet Take 1 tablet by mouth daily.    Marland Kitchen zolpidem (AMBIEN) 5 MG tablet TAKE ONE TABLET BY MOUTH DAILY AT BEDTIME 30 tablet 5  . meloxicam (MOBIC) 7.5 MG tablet Take 1 tablet (7.5 mg total) by mouth daily. (Patient not taking: Reported on 04/27/2023) 30 tablet 3  . methocarbamol (ROBAXIN) 500 MG tablet TAKE 2 TABLETS BY MOUTH TWICE DAILY FOR MUSCLE SPAMS (Patient taking differently: Take 1,000 mg by mouth in the morning. TAKE 2  TABLETS BY MOUTH TWICE DAILY FOR MUSCLE SPAMS) 120 tablet 11   No current facility-administered medications for this visit.    Medication Side Effects: None  Allergies:  Allergies  Allergen Reactions  . Codeine Itching    Past Medical History:  Diagnosis Date  . Allergy   . Anxiety   . Chronic constipation   . Chronic kidney disease    Hx of kindey stones  . Depression   . Hyperlipidemia   . Hypertension    hx of  . Insomnia     Past Medical History, Surgical history, Social history, and Family history were reviewed and updated as appropriate.   Please see review of systems for further details on the patient's review from today.   Objective:   Physical Exam:  Wt 123 lb (55.8 kg)   LMP 07/12/2014 Comment: Tubal Ligation and Neg Preg test  BMI 26.62 kg/m   Physical Exam  Lab Review:     Component Value Date/Time   NA 138 10/28/2022 1618   K 4.2 10/28/2022 1618   CL 103 10/28/2022 1618   CO2 26 10/28/2022 1618   GLUCOSE 87 10/28/2022 1618   BUN 24 10/28/2022 1618   CREATININE 0.94 10/28/2022 1618   CALCIUM 10.3 10/28/2022 1618   PROT 7.5 10/28/2022 1618   ALBUMIN 4.4 11/21/2016 1510   AST 23 10/28/2022 1618   ALT 33 (H) 10/28/2022 1618   ALKPHOS 73 11/21/2016 1510   BILITOT 0.4 10/28/2022 1618   GFRNONAA 58 (L) 11/30/2020 1446   GFRAA 68 11/30/2020 1446       Component Value Date/Time   WBC 6.5 10/28/2022 1618   RBC 3.95 10/28/2022 1618   HGB 12.6 10/28/2022 1618   HCT 36.9 10/28/2022 1618   PLT 307 10/28/2022 1618   MCV 93.4 10/28/2022 1618   MCH 31.9 10/28/2022 1618   MCHC 34.1 10/28/2022 1618   RDW 11.9 10/28/2022 1618   LYMPHSABS 1,476 10/28/2022 1618   MONOABS 424 11/21/2016 1510   EOSABS 72 10/28/2022 1618   BASOSABS 33 10/28/2022 1618    No results found for: "POCLITH", "LITHIUM"   No results found for: "PHENYTOIN", "PHENOBARB", "VALPROATE", "CBMZ"   .res Assessment: Plan:    There are no diagnoses linked to this encounter.    Please see After Visit Summary for patient specific instructions.  Future Appointments  Date Time Provider Department Center  04/29/2023  3:20 PM [name], [name], NP [facility] None  04/30/2023  3:00 PM Stevphen Meuse, [facility] None  05/14/2023  3:00 PM Stevphen Meuse, [facility] None  05/28/2023  5:00 PM Stevphen Meuse, [facility] None    No orders of the defined types were placed in this encounter.   -------------------------------

## 2023-04-29 ENCOUNTER — Encounter: Payer: Self-pay | Admitting: Family

## 2023-04-29 ENCOUNTER — Ambulatory Visit (INDEPENDENT_AMBULATORY_CARE_PROVIDER_SITE_OTHER): Payer: Managed Care, Other (non HMO) | Admitting: Family

## 2023-04-29 VITALS — BP 116/78 | HR 60 | Temp 96.7°F | Resp 20 | Ht <= 58 in | Wt 129.0 lb

## 2023-04-29 DIAGNOSIS — E782 Mixed hyperlipidemia: Secondary | ICD-10-CM

## 2023-04-29 DIAGNOSIS — N1831 Chronic kidney disease, stage 3a: Secondary | ICD-10-CM

## 2023-04-29 DIAGNOSIS — I10 Essential (primary) hypertension: Secondary | ICD-10-CM | POA: Diagnosis not present

## 2023-04-29 DIAGNOSIS — Z1231 Encounter for screening mammogram for malignant neoplasm of breast: Secondary | ICD-10-CM | POA: Diagnosis not present

## 2023-04-29 DIAGNOSIS — Z124 Encounter for screening for malignant neoplasm of cervix: Secondary | ICD-10-CM | POA: Diagnosis not present

## 2023-04-29 NOTE — Progress Notes (Signed)
Provider: Richarda Blade FNP-C   Victoria Floyd, Victoria Citrin, NP  Patient Care Team: Curren Mohrmann, Victoria Citrin, NP as PCP - General (Family Medicine) Pyrtle, [name], MD as Consulting Physician (Gastroenterology)  Extended Emergency Contact Information Primary Emergency Contact: Worrell,[name] Address: [address]          [facility] [place], [place] [address] [place] of [place] Home Phone: [phone] Relation: Mother  Code Status:  Full Code Goals of care: Advanced Directive information    12/30/2022    2:21 PM  Advanced Directives  Does Patient Have a Medical Advance Directive? No  Would patient like information on creating a medical advance directive? No - Patient declined     Chief Complaint  Patient presents with   Medical Management of Chronic Issues    Patient presents today for a 6 month follow-up   Quality Metric Gaps    Mammogram, pap smear, zoster, flu, COVID#2    HPI:  Pt is a 57 y.o. female seen today for 6 months follow up for medical management of chronic diseases. She denies any acute issues. Has had 11 lbs weight loss over 5 months.states has cut down on junk food.Had small piece of a pie during Thanksgiving instead of usual large piece.Has been exercising. States feels good.    Not fasting today will come back for fasting labs. Due for breast  and cervical cancer screening.would like referral to gynecology for pap smear.  She declines Zoster,influenza and COVID-19 vaccine.  Continues to follow up with psychiatry service for anxiety,insomnia and major depression last seen 04/27/2023.    Past Medical History:  Diagnosis Date   Allergy    Anxiety    Chronic constipation    Chronic kidney disease    Hx of kindey stones   Depression    Hyperlipidemia    Hypertension    hx of   Insomnia    Past Surgical History:  Procedure Laterality Date   ANTERIOR CERVICAL DECOMP/DISCECTOMY FUSION N/A 07/12/2014   Procedure: ANTERIOR CERVICAL DECOMPRESSION/DISCECTOMY FUSION 2 LEVELS;   Surgeon: [name], MD;  Location: [facility] OR;  Service: Orthopedics;  Laterality: N/A;  Anterior cervical decompression fusion, cervical 5-6, cervical 6-7 with instrumentation, allograft   BUNIONECTOMY Bilateral    CERVICAL DISC SURGERY  07/12/2014   C5 C6 C7    DR [name]   TUBAL LIGATION     WISDOM TOOTH EXTRACTION      Allergies  Allergen Reactions   Codeine Itching    Allergies as of 04/29/2023       Reactions   Codeine Itching        Medication List        Accurate as of April 29, 2023  3:58 PM. If you have any questions, ask your nurse or doctor.          ALPRAZolam 1 MG tablet Commonly known as: XANAX TAKE 1/2 TO 1 TABLET BY MOUTH TWICE DAILY AS NEEDED Start taking on: June 03, 2023   atorvastatin 20 MG tablet Commonly known as: LIPITOR Take 1 tablet by mouth daily.   Biotin 1 MG Caps Take by mouth.   buPROPion 300 MG 24 hr tablet Commonly known as: WELLBUTRIN XL Take 1 tablet (300 mg total) by mouth daily.   buPROPion 150 MG 24 hr tablet Commonly known as: WELLBUTRIN XL TAKE ONE TABLET BY MOUTH DAILY TAKE WITH 300MG  TO EQUAL A TOTAL DOSE OF 450MG    fluticasone 50 MCG/ACT nasal spray Commonly known as: FLONASE Place 2  sprays into both nostrils daily as needed for allergies or rhinitis.   gabapentin 600 MG tablet Commonly known as: NEURONTIN TAKE ONE TABLET BY MOUTH THREE TIMES DAILY   GOODY HEADACHE PO Take by mouth.   meloxicam 7.5 MG tablet Commonly known as: MOBIC Take 1 tablet (7.5 mg total) by mouth daily.   methocarbamol 500 MG tablet Commonly known as: ROBAXIN TAKE 2 TABLETS BY MOUTH TWICE DAILY FOR MUSCLE SPAMS   metoprolol tartrate 100 MG tablet Commonly known as: LOPRESSOR TAKE TWO TABLETS BY MOUTH DAILY   modafinil 200 MG tablet Commonly known as: PROVIGIL Take 1/2-1 tablet daily   multivitamin tablet Take 1 tablet by mouth daily.   zolpidem 5 MG tablet Commonly known as: AMBIEN TAKE ONE TABLET BY MOUTH  DAILY AT BEDTIME   zolpidem 5 MG tablet Commonly known as: Ambien Take 1 tablet (5 mg total) by mouth at bedtime as needed for sleep. Start taking on: September 04, 2023        Review of Systems  Constitutional:  Negative for appetite change, chills, fatigue, fever and unexpected weight change.  HENT:  Negative for congestion, dental problem, ear discharge, ear pain, facial swelling, hearing loss, nosebleeds, postnasal drip, rhinorrhea, sinus pressure, sinus pain, sneezing, sore throat, tinnitus and trouble swallowing.   Eyes:  Negative for pain, discharge, redness, itching and visual disturbance.  Respiratory:  Negative for cough, chest tightness, shortness of breath and wheezing.   Cardiovascular:  Negative for chest pain, palpitations and leg swelling.  Gastrointestinal:  Negative for abdominal distention, abdominal pain, blood in stool, constipation, diarrhea, nausea and vomiting.  Endocrine: Negative for cold intolerance, heat intolerance, polydipsia, polyphagia and polyuria.  Genitourinary:  Negative for difficulty urinating, dysuria, flank pain, frequency and urgency.  Musculoskeletal:  Positive for arthralgias. Negative for back pain, gait problem, joint swelling, myalgias, neck pain and neck stiffness.       Hands and shoulder   Skin:  Negative for color change, pallor, rash and wound.  Neurological:  Negative for dizziness, syncope, speech difficulty, weakness, light-headedness, numbness and headaches.  Hematological:  Does not bruise/bleed easily.  Psychiatric/Behavioral:  Negative for agitation, behavioral problems, confusion, hallucinations, self-injury, sleep disturbance and suicidal ideas. The patient is not nervous/anxious.     Immunization History  Administered Date(s) Administered   Influenza-Unspecified 02/24/2015   Janssen (J&J) SARS-COV-2 Vaccination 10/22/2019   Tdap 02/24/2015   Pertinent  Health Maintenance Due  Topic Date Due   MAMMOGRAM  06/03/2019    INFLUENZA VACCINE  08/24/2023 (Originally 12/25/2022)   Colonoscopy  12/01/2027      11/30/2020    2:21 PM 12/09/2021    8:32 AM 10/28/2022    3:21 PM 12/30/2022    2:20 PM 04/29/2023    3:24 PM  Fall Risk  Falls in the past year? 0 0 0 0 0  Was there an injury with Fall? 0 0 0 0 0  Fall Risk Category Calculator 0 0 0 0 0  Fall Risk Category (Retired) Low Low     (RETIRED) Patient Fall Risk Level Low fall risk Low fall risk     Patient at Risk for Falls Due to No Fall Risks No Fall Risks No Fall Risks No Fall Risks No Fall Risks  Fall risk Follow up Falls evaluation completed Falls evaluation completed Falls evaluation completed Falls evaluation completed;Education provided;Falls prevention discussed Falls evaluation completed   Functional Status Survey:    Vitals:   04/29/23 1518  BP: 116/78  Pulse: 60  Resp: 20  Temp: (!) 96.7 F (35.9 C)  SpO2: 98%  Weight: 129 lb (58.5 kg)  Height: 4\' 9"  (1.448 m)   Body mass index is 27.92 kg/m. Physical Exam Vitals reviewed.  Constitutional:      General: She is not in acute distress.    Appearance: Normal appearance. She is overweight. She is not ill-appearing or diaphoretic.  HENT:     Head: Normocephalic.     Right Ear: Tympanic membrane, ear canal and external ear normal. There is no impacted cerumen.     Left Ear: Tympanic membrane, ear canal and external ear normal. There is no impacted cerumen.     Nose: Nose normal. No congestion or rhinorrhea.     Mouth/Throat:     Mouth: Mucous membranes are moist.     Pharynx: Oropharynx is clear. No oropharyngeal exudate or posterior oropharyngeal erythema.  Eyes:     General: No scleral icterus.       Right eye: No discharge.        Left eye: No discharge.     Extraocular Movements: Extraocular movements intact.     Conjunctiva/sclera: Conjunctivae normal.     Pupils: Pupils are equal, round, and reactive to light.  Neck:     Vascular: No carotid bruit.  Cardiovascular:     Rate and  Rhythm: Normal rate and regular rhythm.     Pulses: Normal pulses.     Heart sounds: Normal heart sounds. No murmur heard.    No friction rub. No gallop.  Pulmonary:     Effort: Pulmonary effort is normal. No respiratory distress.     Breath sounds: Normal breath sounds. No wheezing, rhonchi or rales.  Chest:     Chest wall: No tenderness.  Abdominal:     General: Bowel sounds are normal. There is no distension.     Palpations: Abdomen is soft. There is no mass.     Tenderness: There is no abdominal tenderness. There is no right CVA tenderness, left CVA tenderness, guarding or rebound.  Musculoskeletal:        General: No swelling or tenderness. Normal range of motion.     Cervical back: Normal range of motion. No rigidity or tenderness.     Right lower leg: No edema.     Left lower leg: No edema.  Lymphadenopathy:     Cervical: No cervical adenopathy.  Skin:    General: Skin is warm and dry.     Coloration: Skin is not pale.     Findings: No bruising, erythema, lesion or rash.  Neurological:     Mental Status: She is alert and oriented to person, place, and time.     Cranial Nerves: No cranial nerve deficit.     Sensory: No sensory deficit.     Motor: No weakness.     Coordination: Coordination normal.     Gait: Gait normal.  Psychiatric:        Mood and Affect: Mood normal.        Speech: Speech normal.        Behavior: Behavior normal.        Thought Content: Thought content normal.        Judgment: Judgment normal.     Labs reviewed: Recent Labs    10/28/22 1618  NA 138  K 4.2  CL 103  CO2 26  GLUCOSE 87  BUN 24  CREATININE 0.94  CALCIUM 10.3   Recent Labs    10/28/22 1618  AST 23  ALT 33*  BILITOT 0.4  PROT 7.5   Recent Labs    10/28/22 1618  WBC 6.5  NEUTROABS 4,310  HGB 12.6  HCT 36.9  MCV 93.4  PLT 307   Lab Results  Component Value Date   TSH 3.56 10/28/2022   No results found for: "HGBA1C" Lab Results  Component Value Date   CHOL  296 (H) 10/28/2022   HDL 76 10/28/2022   LDLCALC 200 (H) 10/28/2022   TRIG 85 10/28/2022   CHOLHDL 3.9 10/28/2022    Significant Diagnostic Results in last 30 days:  No results found.  Assessment/Plan 1. Essential hypertension B/p well controlled  - continue dietary modification and exercise at least three times per week for 30 minutes.  - continue on metoprolol - Lipid panel; Future - TSH; Future - COMPLETE METABOLIC PANEL WITH GFR; Future - CBC with Differential/Platelet; Future  2. Mixed hyperlipidemia LDL not at goal  - - continue dietary modification and exercise at least three times per week for 30 minutes.  - continue on atorvastatin  - Lipid panel; Future  3. Cervical cancer screening Request referral to a female Gynecologist  - Ambulatory referral to Gynecology  4. Breast cancer screening by mammogram Asymptomatic  - MM DIGITAL SCREENING BILATERAL  5. Chronic kidney disease, stage 3a (HCC) Previous CR normal  - Will continue to avoid Nephrotoxins and dose all other medication for renal clearance   Family/ staff Communication: Reviewed plan of care with patient verbalized understanding   Labs/tests ordered:  - Lipid panel; Future - TSH; Future - COMPLETE METABOLIC PANEL WITH GFR; Future - CBC with Differential/Platelet; Future - MM DIGITAL SCREENING BILATERAL  Next Appointment : Return in about 6 months (around 10/28/2023) for medical mangement of chronic issues., fasting labs in one week.   Caesar Bookman, NP

## 2023-04-30 ENCOUNTER — Ambulatory Visit: Payer: Self-pay | Admitting: Psychiatry

## 2023-05-04 ENCOUNTER — Other Ambulatory Visit: Payer: Managed Care, Other (non HMO)

## 2023-05-04 ENCOUNTER — Other Ambulatory Visit: Payer: Self-pay

## 2023-05-04 DIAGNOSIS — E782 Mixed hyperlipidemia: Secondary | ICD-10-CM

## 2023-05-04 DIAGNOSIS — I10 Essential (primary) hypertension: Secondary | ICD-10-CM

## 2023-05-07 ENCOUNTER — Telehealth: Payer: Self-pay

## 2023-05-07 ENCOUNTER — Telehealth: Payer: Self-pay | Admitting: Psychiatry

## 2023-05-07 NOTE — Telephone Encounter (Signed)
PA initiated and approved today. Patient notified.

## 2023-05-07 NOTE — Telephone Encounter (Signed)
Prior Authorization Modafinil 200 mg #30/30 [facility]  Approved Effective:   Coverage Start Date:04/07/2023;Coverage End Date:05/06/2024

## 2023-05-07 NOTE — Telephone Encounter (Signed)
Pt called stating that she needs a PA for her modafinil 200 mg. Wants to know the status of the Pa. Please call her at [phone]

## 2023-05-14 ENCOUNTER — Ambulatory Visit (INDEPENDENT_AMBULATORY_CARE_PROVIDER_SITE_OTHER): Payer: 59 | Admitting: Psychiatry

## 2023-05-14 DIAGNOSIS — F330 Major depressive disorder, recurrent, mild: Secondary | ICD-10-CM | POA: Diagnosis not present

## 2023-05-14 NOTE — Progress Notes (Signed)
[facility] Counselor/Therapist Progress Note  Patient ID: MIKIRA MCELMURRAY, MRN: [id number],    Date: 05/14/2023  Time Spent: 50 minutes start time 3:03 PM end time 3:53 PM  Treatment Type: Individual Therapy  Reported Symptoms: depressed, low motivation, anxiety, loneliness, grief issues, fatigue, procrastination  Mental Status Exam:  Appearance:   Well Groomed     Behavior:  Appropriate  Motor:  Normal  Speech/Language:   Normal Rate  Affect:  Appropriate  Mood:  labile  Thought process:  normal  Thought content:    WNL  Sensory/Perceptual disturbances:    WNL  Orientation:  oriented to person, place, time/date, and situation  Attention:  Good  Concentration:  Good  Memory:  WNL  Fund of knowledge:   Good  Insight:    Good  Judgment:   Good  Impulse Control:  Good   Risk Assessment: Danger to Self:  No Self-injurious Behavior: No Danger to Others: No Duty to Warn:no Physical Aggression / Violence:No  Access to Firearms a concern: No  Gang Involvement:No   Subjective: Patient was present for session. She shared she was at a good place because she was done with work for 2 weeks and is going to her son's to celebrate the Holidays. She also shared that she is on a new medication but I has not helped her yet she explained. She was able to share that she is still not doing things that she knows she needs to do to be able to connect with others.  Patient was challenged to think through her routine and the habit she has gotten into.  As she was talking she was able to realize when she used to go to the gym right after work and was very helpful for her mood.  Discussed getting back into that habit as she thought through the plans she realized that the best thing for her to do was to get out of the car and walked down to the end of the road and back every day before she even goes in the house after work and then she can start building from there.  Discussed the importance of  trying to start doing some of those habits while she is with her son for the next 2 weeks and she will be doing lots of moving anyways.  Patient agreed to follow through with plans from session and to remind herself to focus on the good things.  Interventions: Cognitive Behavioral Therapy and Solution-Oriented/Positive Psychology  Diagnosis:   ICD-10-CM   1. MDD (major depressive disorder), recurrent episode, mild (HCC)  F33.0       Plan: Patient is to practice coping skills discussed in session including grounding and 5, counting backwards from 100 by twos or threes, and brain spotting exercise vergence.  Patient is to try and find ways to connect more with others including making cookies for a neighbor and talking to her son about possibly taking her grandson on a trip.  Patient is to continue identifying cognitive distortions and managing them.  The patient is to take medication as directed.  Patient is to try doing a brain dump sort of journaling prior to sleep. Long-term goal: Develop and implement effective coping skills to carry out normal responsibilities and participate constructively in relationships Short-term goal: Sleep without being disturbed by dreams of trauma: Verbalize hopeful and positive statements regarding the future  Stevphen Meuse, [facility]

## 2023-05-27 ENCOUNTER — Other Ambulatory Visit: Payer: Self-pay | Admitting: Psychiatry

## 2023-05-27 DIAGNOSIS — F411 Generalized anxiety disorder: Secondary | ICD-10-CM

## 2023-05-27 DIAGNOSIS — F4310 Post-traumatic stress disorder, unspecified: Secondary | ICD-10-CM

## 2023-05-28 ENCOUNTER — Ambulatory Visit: Payer: Self-pay | Admitting: Psychiatry

## 2023-06-08 ENCOUNTER — Ambulatory Visit: Payer: 59 | Admitting: Psychiatry

## 2023-06-08 ENCOUNTER — Telehealth: Payer: Self-pay | Admitting: Psychiatry

## 2023-06-08 DIAGNOSIS — F331 Major depressive disorder, recurrent, moderate: Secondary | ICD-10-CM | POA: Diagnosis not present

## 2023-06-08 NOTE — Telephone Encounter (Signed)
 Ms. Victoria Floyd, [place] are scheduled for a virtual visit with your provider today.    Just as we do with appointments in the office, we must obtain your consent to participate.  Your consent will be active for this visit and any virtual visit you may have with one of our providers in the next 365 days.    If you have a MyChart account, I can also send a copy of this consent to you electronically.  All virtual visits are billed to your insurance company just like a traditional visit in the office.  As this is a virtual visit, video technology does not allow for your provider to perform a traditional examination.  This may limit your provider's ability to fully assess your condition.  If your provider identifies any concerns that need to be evaluated in person or the need to arrange testing such as labs, EKG, etc, we will make arrangements to do so.    Although advances in technology are sophisticated, we cannot ensure that it will always work on either your end or our end.  If the connection with a video visit is poor, we may have to switch to a telephone visit.  With either a video or telephone visit, we are not always able to ensure that we have a secure connection.   I need to obtain your verbal consent now.   Are you willing to proceed with your visit today?   [name] has provided verbal consent on 06/08/2023 for a virtual visit (video or telephone).   Silvano Pacini, [facility] 06/08/2023  2:03 PM

## 2023-06-08 NOTE — Progress Notes (Signed)
 [facility] Counselor/Therapist Progress Note  Patient ID: Victoria Floyd, MRN: [id number],    Date: 06/08/2023  Time Spent: 58 minutes start time 2:03 PM end time 3:01 PM Virtual Visit via Video Note Connected with patient by a telemedicine/telehealth application, with their informed consent, and verified patient privacy and that I am speaking with the correct person using two identifiers. I discussed the limitations, risks, security and privacy concerns of performing psychotherapy and the availability of in person appointments. I also discussed with the patient that there may be a patient responsible charge related to this service. The patient expressed understanding and agreed to proceed. I discussed the treatment planning with the patient. The patient was provided an opportunity to ask questions and all were answered. The patient agreed with the plan and demonstrated an understanding of the instructions. The patient was advised to call  our office if  symptoms worsen or feel they are in a crisis state and need immediate contact.   Therapist Location: home Patient Location: home    Treatment Type: Individual Therapy  Reported Symptoms: fatigue, focusing issues, low motivation, nightmares, rumination, triggered responses, anxiety, panic attacks, grief issues  Mental Status Exam:  Appearance:   Casual     Behavior:  Appropriate  Motor:  Normal  Speech/Language:   Normal Rate  Affect:  Appropriate tearful  Mood:  anxious and sad  Thought process:  normal  Thought content:    WNL  Sensory/Perceptual disturbances:    WNL  Orientation:  oriented to person, place, time/date, and situation  Attention:  Good  Concentration:  Good  Memory:  WNL  Fund of knowledge:   Good  Insight:    Good  Judgment:   Good  Impulse Control:  Good   Risk Assessment: Danger to Self:  No Self-injurious Behavior: No Danger to Others: No Duty to Warn:no Physical Aggression / Violence:No  Access  to Firearms a concern: No  Gang Involvement:No   Subjective: 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 She shared she was not doing well. She was not able to leave her home and hasn't been able to get to work and function. She shared she was having nightmares and unable to get the sleep she needs at night. Shared she has been having a reoccurring nightmare of a lady burning in a car and she can't get her out of it and she is screaming. Did processing set on it SUDS level 10, negative cognition I am a train wreck felt sadness, despair in her chest.  Patient was able to reduce those level to 3.  Through the processing she was able to realize that her dream was let her know that she was still very angry with rain, herself, and Christina.  Encouraged patient to start writing letters to the people she was frustrated with to release that anger in a more appropriate manner.  Patient explained she did not feel that she could go back to the office at this time because she felt that she did right after running dad and the grief was overwhelming.  Rayn's birthday is in a few weeks and her anniversary death date is in 08-15-2023.  Agreed to fill out the paperwork for her to be out of work for a month and see if that is enough time to get her back to functioning.  Patient was encouraged to recognize she cannot stay at home and ruminate so she agreed to try and go to her mothers  or go out to [place] or somewhere to try not to stay at home.  Interventions: Solution-Oriented/Positive Psychology, Eye Movement Desensitization and Reprocessing (EMDR), and Insight-Oriented  Diagnosis:   ICD-10-CM   1. MDD (major depressive disorder), recurrent episode, moderate (HCC)  F33.1       Plan:  Patient is to practice coping skills discussed in session including grounding and 5, counting backwards from 100 by twos or threes, and brain spotting exercise vergence.  Patient is to write letters to herself to her daughter rain, and  her daughter's ex-girlfriend [name] sharing the anger she is feeling.  Patient is to work on ways of releasing the anger appropriately through different movement.  Patient is to try and find ways to get out of her room including going to her mothers in the store.  Patient is to continue identifying cognitive distortions and managing them.  The patient is to take medication as directed.  Patient is to try doing a brain dump sort of journaling prior to sleep.   Silvano Pacini, [facility]

## 2023-06-17 ENCOUNTER — Ambulatory Visit (INDEPENDENT_AMBULATORY_CARE_PROVIDER_SITE_OTHER): Payer: 59 | Admitting: Psychiatry

## 2023-06-17 DIAGNOSIS — F331 Major depressive disorder, recurrent, moderate: Secondary | ICD-10-CM
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
## 2023-06-18 ENCOUNTER — Encounter: Payer: Self-pay | Admitting: Physician Assistant

## 2023-06-18 ENCOUNTER — Ambulatory Visit (INDEPENDENT_AMBULATORY_CARE_PROVIDER_SITE_OTHER): Payer: 59 | Admitting: Physician Assistant

## 2023-06-18 VITALS — BP 120/86 | HR 69

## 2023-06-18 DIAGNOSIS — F515 Nightmare disorder: Secondary | ICD-10-CM

## 2023-06-18 DIAGNOSIS — F4310 Post-traumatic stress disorder, unspecified: Secondary | ICD-10-CM

## 2023-06-18 DIAGNOSIS — F331 Major depressive disorder, recurrent, moderate: Secondary | ICD-10-CM

## 2023-06-18 DIAGNOSIS — G4700 Insomnia, unspecified: Secondary | ICD-10-CM

## 2023-06-18 MED ORDER — PRAZOSIN HCL 1 MG PO CAPS: ORAL_CAPSULE | ORAL | 0 refills | Status: DC
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
## 2023-06-19 ENCOUNTER — Telehealth: Payer: Self-pay | Admitting: Psychiatry

## 2023-06-19 NOTE — Telephone Encounter (Signed)
Called patient to check on her.  She shared she was not doing well and her anxiety was increasing.  She did say that the medication helped her sleep last night which was a positive.  Patient was encouraged to consider an IOP program over the weekend.  Patient still stated that she is safe and not having intrusive thoughts of self-harm but the anxiety is making it so she does not want to leave her house.  Encouraged patient to think about higher level of care if she is not able to start getting out of the house soon.  Agreed to call pack patient next week to discuss further.

## 2023-06-22 ENCOUNTER — Telehealth: Payer: Self-pay | Admitting: Psychiatry

## 2023-06-22 DIAGNOSIS — Z0289 Encounter for other administrative examinations: Secondary | ICD-10-CM

## 2023-06-22 NOTE — Telephone Encounter (Signed)
Called patient to check on her. She shared she was still not doing well. Discussed IOP and she agreed to call her insurance company to see which ones they covered and than connect with a program

## 2023-06-23 ENCOUNTER — Telehealth: Payer: Self-pay | Admitting: Physician Assistant

## 2023-06-23 NOTE — Telephone Encounter (Signed)
Pt called checking on the status of the paperwork. She said that [facility] hasn't received it yet. It is due by February 2nd

## 2023-06-23 NOTE — Telephone Encounter (Signed)
Victoria Floyd has it and will sign today

## 2023-06-23 NOTE — Telephone Encounter (Signed)
Forms completed and given to admin staff to fax along with office notes.

## 2023-06-25 NOTE — Telephone Encounter (Signed)
FYI

## 2023-06-25 NOTE — Telephone Encounter (Signed)
Form was faxed to [facility] 06/24/23 and the form and records request was faxed to HIM to send to [facility].

## 2023-06-30 ENCOUNTER — Telehealth (HOSPITAL_COMMUNITY): Payer: Self-pay | Admitting: Psychiatry

## 2023-06-30 NOTE — Telephone Encounter (Signed)
D:  Pt phoned inquiring about virtual MH-IOP; states she was referred by Stevphen Meuse, [facility].  A:  Oriented pt.  Encouraged pt to verify her insurance benefits.  Scheduled pt's CCA for Thur. 07-02-23 @ 1pm.  Pt states she couldn't do it tomorrow.

## 2023-07-01 ENCOUNTER — Ambulatory Visit (INDEPENDENT_AMBULATORY_CARE_PROVIDER_SITE_OTHER): Payer: 59 | Admitting: Psychiatry

## 2023-07-01 DIAGNOSIS — F331 Major depressive disorder, recurrent, moderate: Secondary | ICD-10-CM | POA: Diagnosis not present

## 2023-07-01 NOTE — Progress Notes (Signed)
 [facility] Counselor/Therapist Progress Note  Patient ID: Victoria Floyd, MRN: [id number],    Date: 07/01/2023  Time Spent: 52 minutes start time 10:06 AM end time 10:58 AM Virtual Visit via Video Note Connected with patient by a telemedicine/telehealth application, with their informed consent, and verified patient privacy and that I am speaking with the correct person using two identifiers. I discussed the limitations, risks, security and privacy concerns of performing psychotherapy and the availability of in person appointments. I also discussed with the patient that there may be a patient responsible charge related to this service. The patient expressed understanding and agreed to proceed. I discussed the treatment planning with the patient. The patient was provided an opportunity to ask questions and all were answered. The patient agreed with the plan and demonstrated an understanding of the instructions. The patient was advised to call  our office if  symptoms worsen or feel they are in a crisis state and need immediate contact.   Therapist Location: home Patient Location: home    Treatment Type: Individual Therapy  Reported Symptoms: migraines, sleep issues, anxiety, sadness, rumination, panic, isolating, focusing issues, crying spells, fatigue, flashbacks  Mental Status Exam:  Appearance:   Casual and Neat     Behavior:  Appropriate  Motor:  Normal  Speech/Language:   Normal Rate  Affect:  Appropriate  Mood:  anxious and sad  Thought process:  normal  Thought content:    WNL  Sensory/Perceptual disturbances:    WNL  Orientation:  oriented to person, place, time/date, and situation  Attention:  Good  Concentration:  Good  Memory:  WNL  Fund of knowledge:   Good  Insight:    Good  Judgment:   Good  Impulse Control:  Good   Risk Assessment: Danger to Self:  No Self-injurious Behavior: No Danger to Others: No Duty to Warn:no Physical Aggression / Violence:No   Access to Firearms a concern: No  Gang Involvement:No   Subjective: Met with patient via virtual session. She shared she has an assessment for IOP program on Thursday. She went on to share that she is feeling anxious about it. Encouraged her talk to the assessment provider about her concerns.  Also talked to patient about the importance of trying to get back out in a safe way or to interact with other people.  Patient admitted that she continues to have extreme anxiety about interacting with anybody even on.  Discussed the fact that the online contact may help prepare her for meeting more in person.  Patient did share that she is planning on going and seeing her mother even though that is very difficult discussed CBT filters that she can use to help her be able to get out and spend time with her mother even if she cannot get to anything.  Patient was also encouraged to feel okay about crying or getting upset that right now she has to just focus on getting comfortable and doing those things will help that happen.  Interventions: Cognitive Behavioral Therapy and Insight-Oriented  Diagnosis:   ICD-10-CM   1. MDD (major depressive disorder), recurrent episode, moderate (HCC)  F33.1       Plan: Patient is to practice coping skills.  Patient is to meet with the assessment worker for IOP program patient is to write letters to herself to her daughter rain, and her daughter's ex-girlfriend [name] sharing the anger she is feeling.  Patient is to work on ways of releasing the anger  appropriately through different movement.  Patient is to try and find ways to get out of her room including going to her mothers in the store.  Patient is to continue identifying cognitive distortions and managing them.  The patient is to take medication as directed.  Patient is to try doing a brain dump sort of journaling prior to sleep   Silvano Pacini, LCMHC

## 2023-07-02 ENCOUNTER — Other Ambulatory Visit (HOSPITAL_COMMUNITY): Payer: Self-pay | Attending: Psychiatry | Admitting: Psychiatry

## 2023-07-02 DIAGNOSIS — Z1330 Encounter for screening examination for mental health and behavioral disorders, unspecified: Secondary | ICD-10-CM
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
## 2023-07-05 NOTE — Progress Notes (Signed)
Psychiatric Initial Adult Assessment   Virtual Visit via Video Note   I connected with [name] on 07/05/2023,  9:00 AM EST by a video enabled telemedicine application and verified that I am speaking with the correct person using two identifiers.   Location: Patient: Home Provider: Clinic   I discussed the limitations of evaluation and management by telemedicine and the availability of in person appointments. The patient expressed understanding and agreed to proceed.   Follow Up Instructions:   I discussed the assessment and treatment plan with the patient. The patient was provided an opportunity to ask questions and all were answered. The patient agreed with the plan and demonstrated an understanding of the instructions.   The patient was advised to call back or seek an in-person evaluation if the symptoms worsen or if the condition fails to improve as anticipated.   [name], MD  Patient Identification: Victoria Floyd MRN:  [id number] Date of Evaluation:  07/05/2023 Referral Source: Stevphen Meuse, [facility] Chief Complaint:  No chief complaint on file.   Visit Diagnosis: No diagnosis found.  History of Present Illness:  Victoria Floyd is a 58 y.o. female with history of MDD, PTSD is presenting to IOP due to worsening depression in setting of multiple psychosocial stressors including upcoming anniversary of daughter's suicide and missing work due to nightmares resulting in missing several days. Her current medication regiment has a combination of stimulating (bupropion and modafinil) and anxiolyitic medication (xanax, gabapentin, ambien). Some concern for polypharmacy and it appears she has been on xanax and ambien for over 20 years. She was also started on prazosin 2 mg at bedtime for nightmares. Of note, she is not on a SSRI/SNRI due prior side effects including sleepiness, increased anxiety, trembling to name a few nor has she been on an antipsychotic for adjunct treatment to  antidepressant. She has not explore interventional options including ECT and TMS.  She also has not had GeneSight testing before.  Patient reports not knowing why but after Christmas she had noticed she was struggling with significant anxiety related to leaving the house as well as worsening depressive symptoms.  She endorsed significant anhedonia, poor energy, poor concentration, poor appetite, poor sleep, and depressed mood.  She reports feeling extremely overwhelmed.  She does report she does have worsening depressive symptoms near the anniversary of her daughter's suicide but is unable to pinpoint why this year was so much more severe than before.  Of note, patient was started on modafinil approximately 1 to 2 weeks prior to Christmas and that was the only psychotropic adjustment that has been made recently.  She reports that her other psychotropics have been relatively constant for several years now.  Patient is aware that she is both on stimulants and sedatives as well as risks involved with dependence, early onset dementia, and falls.  Patient denies history of mania or psychosis.  She does endorse significant anxiety especially with things that remind her of her daughter.  She cites the example of being unable to go to the rec room due to overwhelming sense of anxiety.  Patient denies substance use history besides cigarette smoking to which she quit several years ago.   Past Psychiatric History:  Diagnoses: Major depressive disorder, PTSD Medication trials:  Current: xanax, bupropion, gabapentin, modafinil, prazosin, ambien Previous: fluoxetine, lexapro, effexor (increased sleepiness), duloxetine (helpful but tremors), sertraline (increased anxiety) Previous psychiatrist/therapist: Currently sees Melony Overly for psychiatric needs Hospitalizations: denies ED/BHUC: denies Suicide attempts: denies SIB: denies Hx  of violence towards others: denies Current access to guns:  denies Trauma/abuse: endorses  Substance Use History: EtOH:  reports that she does not currently use alcohol. Nicotine:  reports that she quit smoking about 15 years ago. Her smoking use included cigarettes. She started smoking about 30 years ago. She has never used smokeless tobacco. Marijuana: denies IV drug use: denies Stimulants: prescribed Opiates: denies Sedative/hypnotics: prescribed Hallucinogens: denies DT: denies Detox: denies Residential: n/a  Past Medical History: Dx:  has a past medical history of Allergy, Anxiety, Chronic constipation, Chronic kidney disease, Depression, Hyperlipidemia, Hypertension, and Insomnia.  Head trauma: denies Seizures: denies Allergies: Codeine   Family Psychiatric History:  Suicide: daughter Homicide: denies Psych hospitalization: denies BiPD: denies SCZ/SCzA: denies Substance use: endorses   Previous Psychotropic Medications: Yes   Substance Abuse History in the last 12 months:  No.  Consequences of Substance Abuse: NA  Past Medical History:  Past Medical History:  Diagnosis Date   Allergy    Anxiety    Chronic constipation    Chronic kidney disease    Hx of kindey stones   Depression    Hyperlipidemia    Hypertension    hx of   Insomnia     Past Surgical History:  Procedure Laterality Date   ANTERIOR CERVICAL DECOMP/DISCECTOMY FUSION N/A 07/12/2014   Procedure: ANTERIOR CERVICAL DECOMPRESSION/DISCECTOMY FUSION 2 LEVELS;  Surgeon: [name], MD;  Location: [facility] OR;  Service: Orthopedics;  Laterality: N/A;  Anterior cervical decompression fusion, cervical 5-6, cervical 6-7 with instrumentation, allograft   BUNIONECTOMY Bilateral    CERVICAL DISC SURGERY  07/12/2014   C5 C6 C7    DR [name]   TUBAL LIGATION     WISDOM TOOTH EXTRACTION      Family History:  Family History  Problem Relation Age of Onset   Depression Mother    Anxiety disorder Mother    Colon polyps Mother    Colon cancer Father 70        passed away age 45    Alcohol abuse Daughter    Suicidality Daughter    Esophageal cancer Neg Hx    Rectal cancer Neg Hx    Stomach cancer Neg Hx    Breast cancer Neg Hx     Social History:   Social History   Socioeconomic History   Marital status: Single    Spouse name: Not on file   Number of children: 2   Years of education: Not on file   Highest education level: Associate degree: occupational, Scientist, product/process development, or vocational program  Occupational History    Comment: full time  Tobacco Use   Smoking status: Former    Current packs/day: 0.00    Types: Cigarettes    Start date: 12/05/1992    Quit date: 12/06/2007    Years since quitting: 15.5   Smokeless tobacco: Never   Tobacco comments:    Quit age 11   Vaping Use   Vaping status: Former  Substance and Sexual Activity   Alcohol use: Not Currently   Drug use: No   Sexual activity: Yes  Other Topics Concern   Not on file  Social History Narrative   DIET: I eat healthy       DO YOU DRINK/EAT THINGS WITH CAFFEINE: Yes      MARITAL STATUS: Divorced      WHAT YEAR WERE YOU MARRIED: 1992      DO YOU LIVE IN A HOUSE, APARTMENT, ASSISTED LIVING, CONDO TRAILER ETC.:  House  IS IT ONE OR MORE STORIES: Yes      HOW MANY PERSONS LIVE IN YOUR HOME: 1      DO YOU HAVE PETS IN YOUR HOME: 2 cats      CURRENT OR PAST PROFESSION: machinist/ EMT      DO YOU EXERCISE: yes      WHAT TYPE AND HOW OFTEN: occasionally   Social Drivers of Corporate investment banker Strain: Not on file  Food Insecurity: No Food Insecurity (12/05/2017)   Hunger Vital Sign    Worried About Running Out of Food in the Last Year: Never true    Ran Out of Food in the Last Year: Never true  Transportation Needs: No Transportation Needs (12/05/2017)   PRAPARE - Administrator, Civil Service (Medical): No    Lack of Transportation (Non-Medical): No  Physical Activity: Inactive (12/05/2017)   Exercise Vital Sign    Days of Exercise per  Week: 0 days    Minutes of Exercise per Session: 0 min  Stress: Stress Concern Present (12/05/2017)   [employer] of Occupational Health - Occupational Stress Questionnaire    Feeling of Stress : To some extent  Social Connections: Moderately Isolated (12/05/2017)   Social Connection and Isolation Panel [NHANES]    Frequency of Communication with Friends and Family: More than three times a week    Frequency of Social Gatherings with Friends and Family: Once a week    Attends Religious Services: Never    Database administrator or Organizations: No    Attends Banker Meetings: Never    Marital Status: Divorced   Allergies:   Allergies  Allergen Reactions   Codeine Itching    Metabolic Disorder Labs: No results found for: "HGBA1C", "MPG" No results found for: "PROLACTIN" Lab Results  Component Value Date   CHOL 296 (H) 10/28/2022   TRIG 85 10/28/2022   HDL 76 10/28/2022   CHOLHDL 3.9 10/28/2022   VLDL 19 11/21/2016   LDLCALC 200 (H) 10/28/2022   LDLCALC 99 04/02/2021   Lab Results  Component Value Date   TSH 3.56 10/28/2022    Therapeutic Level Labs: No results found for: "LITHIUM" No results found for: "CBMZ" No results found for: "VALPROATE"  Current Medications: Current Outpatient Medications  Medication Sig Dispense Refill   ALPRAZolam (XANAX) 1 MG tablet TAKE 1/2 TO 1 TABLET BY MOUTH TWICE DAILY AS NEEDED 45 tablet 5   Aspirin-Acetaminophen-Caffeine (GOODY HEADACHE PO) Take by mouth.     atorvastatin (LIPITOR) 20 MG tablet Take 1 tablet by mouth daily. 90 tablet 1   Biotin 1 MG CAPS Take by mouth.     buPROPion (WELLBUTRIN XL) 150 MG 24 hr tablet TAKE ONE TABLET BY MOUTH DAILY TAKE WITH 300MG  TO EQUAL A TOTAL DOSE OF 450MG  90 tablet 1   buPROPion (WELLBUTRIN XL) 300 MG 24 hr tablet Take 1 tablet (300 mg total) by mouth daily. 90 tablet 1   fluticasone (FLONASE) 50 MCG/ACT nasal spray Place 2 sprays into both nostrils daily as needed for allergies  or rhinitis. 16 g 11   gabapentin (NEURONTIN) 600 MG tablet TAKE ONE TABLET BY MOUTH THREE TIMES DAILY 270 tablet 1   methocarbamol (ROBAXIN) 500 MG tablet TAKE 2 TABLETS BY MOUTH TWICE DAILY FOR MUSCLE SPAMS (Patient not taking: Reported on 04/29/2023) 120 tablet 11   metoprolol tartrate (LOPRESSOR) 100 MG tablet TAKE TWO TABLETS BY MOUTH DAILY 180 tablet 1   modafinil (PROVIGIL) 200  MG tablet Take 1/2-1 tablet daily 30 tablet 5   Multiple Vitamin (MULTIVITAMIN) tablet Take 1 tablet by mouth daily.     prazosin (MINIPRESS) 1 MG capsule 1 po at bedtime for 4 nights, then if tolerated (no dizziness) increase to 2 po at bedtime. 60 capsule 0   zolpidem (AMBIEN) 5 MG tablet TAKE ONE TABLET BY MOUTH DAILY AT BEDTIME 30 tablet 5   [START ON 09/04/2023] zolpidem (AMBIEN) 5 MG tablet Take 1 tablet (5 mg total) by mouth at bedtime as needed for sleep. 30 tablet 2   No current facility-administered medications for this visit.   VIRTUAL VISIT, LIMITED ASSESSMENT Musculoskeletal: Strength & Muscle Tone: unable to assess Gait & Station: unable to assess Patient leans: unable to assess  Psychiatric Specialty Exam: General Appearance: Casual, faily groomed  Eye Contact:  Good    Speech:  Clear, coherent, normal rate   Volume:  Normal   Mood:  "anxious"  Affect:  Appropriate, congruent, full range  Thought Content: Logical, rumination  Suicidal Thoughts: Denied active SI,  passive SI    Thought Process:  Coherent, goal-directed, linear   Orientation:  A&Ox4   Memory:  Immediate good  Judgment:  Fair   Insight:  Fair  Concentration:  Attention and concentration good   Recall:  Good  Fund of Knowledge: Good  Language: Good, fluent  Psychomotor Activity: grossly appears normal  Akathisia:  NA   AIMS (if indicated): NA   Assets:  Communication Skills Desire for Improvement Financial Resources/Insurance Housing Leisure Time Physical Health Resilience Social Support  ADL's:  Intact   Cognition: WNL  Sleep:  fair    Screenings: Oceanographer Row Counselor from 07/02/2023 in [facility] INTENSIVE PSYCH Office Visit from 10/28/2022 in [facility] Office Visit from 12/09/2021 in [facility] Office Visit from 06/22/2018 in [facility] Office Visit from 04/07/2017 in [facility]  PHQ-2 Total Score 6 0 0 0 0  PHQ-9 Total Score 16 -- -- -- --       Assessment and Plan:  BRAYLEIGH RYBACKI is a 58 year old female with history of MDD and PTSD presenting to intensive outpatient due to worsening depressive symptoms and agoraphobia.  Given the only recent psychotropic adjustment that has been made with the addition of modafinil, there is a consideration of whether this could be contributing to her worsening anxiety which is exacerbating her depressive symptoms.  She is on multiple activating and sedating agents which may put patient at risk of dependence even if she is taking it as prescribed.  That being said, given the length of time she has been on some of these medications, she is very insistent that they are necessary and she does not want any of those changed besides modafinil.  She has self discontinued from prazosin due to side effects that I suspect are related to interaction between prazosin and Ambien.  I discussed alternative options including adjunct treatment with Abilify or initiation of mirtazapine for which she was open to discussing with her outpatient psychiatrist tomorrow.  She expressed significant anxiety when discussing any changes made to her medications as she feels that they have all been helpful for a variety of purposes.  Major depressive disorder Posttraumatic stress disorder Grief Agoraphobia -Continue bupropion 450 mg daily for hot flashes and mood lability as well as depression -Continue  gabapentin 600  mg 3 times daily for neuropathy and anxiety -Continue Xanax 0.5 to 1 mg twice daily as needed  -may benefit from cessation -Continue Ambien 5 mg nightly as needed  -advise minimizing use for similar reasons as alprazolam -Self discontinued prazosin  Collaboration of Care: Case was staffed with attending, see attestation per above.   Patient/Guardian was advised Release of Information must be obtained prior to any record release in order to collaborate their care with an outside provider. Patient/Guardian was advised if they have not already done so to contact the registration department to sign all necessary forms in order for [place] to release information regarding their care.   Consent: Patient/Guardian gives verbal consent for treatment and assignment of benefits for services provided during this visit. Patient/Guardian expressed understanding and agreed to proceed.   [name], MD

## 2023-07-06 ENCOUNTER — Other Ambulatory Visit (HOSPITAL_COMMUNITY): Payer: Self-pay | Attending: Psychiatry | Admitting: Psychiatry

## 2023-07-06 ENCOUNTER — Encounter (HOSPITAL_COMMUNITY): Payer: Self-pay

## 2023-07-06 ENCOUNTER — Encounter (HOSPITAL_COMMUNITY): Payer: Self-pay | Admitting: Psychiatry

## 2023-07-06 DIAGNOSIS — F4000 Agoraphobia, unspecified: Secondary | ICD-10-CM | POA: Insufficient documentation

## 2023-07-06 DIAGNOSIS — F4310 Post-traumatic stress disorder, unspecified: Secondary | ICD-10-CM | POA: Insufficient documentation

## 2023-07-06 DIAGNOSIS — F331 Major depressive disorder, recurrent, moderate: Secondary | ICD-10-CM

## 2023-07-06 DIAGNOSIS — F329 Major depressive disorder, single episode, unspecified: Secondary | ICD-10-CM | POA: Diagnosis present

## 2023-07-06 NOTE — Progress Notes (Signed)
 Virtual Visit via Video Note   I connected with Victoria Floyd on 07/06/23 at  9:00 AM EDT by a video enabled telemedicine application and verified that I am speaking with the correct person using two identifiers.   At orientation to the IOP program, Case Manager discussed the limitations of evaluation and management by telemedicine and the availability of in person appointments. The patient expressed understanding and agreed to proceed with virtual visits throughout the duration of the program.   Location:  Patient: Patient Home Provider: [facility] Office   History of Present Illness: MDD and PTSD   Observations/Objective: Check In: Case Manager checked in with all participants to review discharge dates, insurance authorizations, work-related documents and needs from the treatment team regarding medications. Victoria Floyd stated needs and engaged in discussion.    Initial Therapeutic Activity: Counselor facilitated a check-in with Victoria Floyd to assess for safety, sobriety and medication compliance.  Counselor also inquired about Victoria Floyd's current emotional ratings, as well as any significant changes in thoughts, feelings or behavior since previous check in.  Victoria Floyd presented for session on time and was alert, oriented x5, with no evidence or self-report of active SI/HI or A/V H.  Victoria Floyd reported compliance with medication and denied use of alcohol or illicit substances.  Victoria Floyd reported scores of 10/10 for depression, 5/10 for anxiety, and 0/10 for anger/irritability.  Victoria Floyd denied any recent outbursts or panic attacks.  Victoria Floyd reported that a struggle has been dealing with increased depression and anxiety, which led her to seek group therapy for resolution.  Victoria Floyd reported that a success was doing some cleaning, coloring, and watching TV over the weekend.  Victoria Floyd reported that her goal today is to find something to do for self-care so that she can get out of the home, such as visiting her mother.           Second Therapeutic Activity: Counselor introduced topic of anger management today.  Counselor virtually shared a handout with members on this subject featuring a variety of coping skills, and facilitated discussion on these approaches.  Examples included raising awareness of anger triggers, practicing deep breathing, keeping an anger log to better understand episodes, using diversion activities to distract oneself for 30 minutes, taking a time out when necessary, and being mindful of warning signs tied to thoughts or behavior.  Counselor inquired about which techniques group members have used before, what has proved to be helpful, what their unique warning signs might be, as well as what they will try out in the future to assist with de-escalation.  Intervention was effective, as evidenced by Victoria Floyd participating in discussion on activity, and reporting that although she isn't typically quick to get angry, she has noticed herself getting frustrated more quickly as of late.  Victoria Floyd reported that her triggers include being in uncomfortable social situations in public, having to wait longer than expected, minor annoyances, feeling ignored or overlooked by others, helpless or out of control, or when people don't work equally hard.  Victoria Floyd reported that warning signs include increased anxiety, hyperventilating, and moodiness.  Victoria Floyd reported that she will work to manage anger more effectively by using coping skills such as keeping an anger journal, meditation, and deep breathing.   Assessment and Plan: Counselor recommends that Victoria Floyd remain in IOP treatment to better manage mental health symptoms, ensure stability and pursue completion of treatment plan goals. Counselor recommends adherence to crisis/safety plan, taking medications as prescribed, and following up with medical professionals if any issues arise.  Follow Up Instructions: Counselor will send Webex link for session tomorrow.  Victoria Floyd was advised to  call back or seek an in-person evaluation if the symptoms worsen or if the condition fails to improve as anticipated.   Collaboration of Care:   Medication Management AEB Dan Dun, NP or Dr. Augusta Blizzard                                          Case Manager AEB Molinda Angelica, CNA    Patient/Guardian was advised Release of Information must be obtained prior to any record release in order to collaborate their care with an outside provider. Patient/Guardian was advised if they have not already done so to contact the registration department to sign all necessary forms in order for us  to release information regarding their care.    Consent: Patient/Guardian gives verbal consent for treatment and assignment of benefits for services provided during this visit. Patient/Guardian expressed understanding and agreed to proceed.   I provided 180 minutes of non-face-to-face time during this encounter.   Desmond Florida, LCSW, LCAS 07/06/23

## 2023-07-07 ENCOUNTER — Ambulatory Visit: Payer: 59 | Admitting: Physician Assistant

## 2023-07-07 ENCOUNTER — Telehealth (HOSPITAL_COMMUNITY): Payer: Self-pay | Admitting: Psychiatry

## 2023-07-07 ENCOUNTER — Encounter: Payer: Self-pay | Admitting: Physician Assistant

## 2023-07-07 ENCOUNTER — Other Ambulatory Visit (HOSPITAL_COMMUNITY): Payer: Self-pay | Admitting: Psychiatry

## 2023-07-07 DIAGNOSIS — F515 Nightmare disorder: Secondary | ICD-10-CM

## 2023-07-07 DIAGNOSIS — F4310 Post-traumatic stress disorder, unspecified: Secondary | ICD-10-CM

## 2023-07-07 DIAGNOSIS — F411 Generalized anxiety disorder: Secondary | ICD-10-CM

## 2023-07-07 DIAGNOSIS — F332 Major depressive disorder, recurrent severe without psychotic features: Secondary | ICD-10-CM | POA: Diagnosis not present

## 2023-07-07 DIAGNOSIS — G4700 Insomnia, unspecified: Secondary | ICD-10-CM | POA: Diagnosis not present

## 2023-07-07 MED ORDER — ARIPIPRAZOLE 2 MG PO TABS: 2.0000 mg | ORAL_TABLET | Freq: Every day | ORAL | 0 refills | Status: DC

## 2023-07-07 MED ORDER — ALPRAZOLAM 1 MG PO TABS: 0.5000 mg | ORAL_TABLET | Freq: Two times a day (BID) | ORAL | 5 refills | Status: DC | PRN

## 2023-07-07 NOTE — Progress Notes (Signed)
[facility]  Patient ID: Victoria Floyd,  MRN: [id number]  PCP: Caesar Bookman, NP  Date of Evaluation: 07/07/2023 Time spent:35 minutes  Chief Complaint:  Chief Complaint   Anxiety; Depression    HISTORY/CURRENT STATUS: HPI for 2-3 week med check.  This is my second visit with her.  This was supposed to be a follow-up appointment after starting prazosin for nightmares on 06/18/2023.  She stopped it after approximately 1 week because of intolerable morning sedation, plus it did not help the nightmares.  At that visit she reported worsening of depression and anxiety. She has been out of work even before that appointment, and started IOP yesterday.  She is still very depressed, not wanting to go out of the house at all.  She did go visit her mother recently but that is the only time she has been out.  She cries easily.  Energy and motivation are very low.  ADLs are the least she can get by with.  Personal hygiene is decreased.  She is tired all the time.  If she does not take the Ambien she cannot sleep.  States she has been on it for 20+ years.  Denies suicidal or homicidal thoughts  She has been on modafinil for several months, initially for energy I believe.  It has not really helped at all.  Wonders if increasing the dose would be more beneficial.  Anxiety is still a huge problem.  She is having frequent panic attacks, sometimes daily.  Has shortness of breath, palpitations, and her hands feels sweaty.  It can last hours.  Even without panic attack she is anxious and on edge most all of the time.  The Xanax does help.  She had been trying to decrease the dose with success, over the past year or so, not taking a total of 2 mg a day but 1.5 mg some days and 1 mg most days.  She has been needing it more often the past few weeks than she has in quite some time.  She has been taking 2 pills some days and it is helpful.  Patient denies increased energy with decreased need for  sleep, increased talkativeness, racing thoughts, impulsivity or risky behaviors, increased spending, increased libido, grandiosity, increased irritability or anger, paranoia, or hallucinations.  Review of Systems  Constitutional:  Positive for malaise/fatigue.  HENT: Negative.    Eyes: Negative.   Respiratory: Negative.    Cardiovascular: Negative.   Gastrointestinal: Negative.   Genitourinary: Negative.   Musculoskeletal: Negative.   Skin: Negative.   Neurological: Negative.   Endo/Heme/Allergies: Negative.   Psychiatric/Behavioral:         See HPI   Individual Medical History/ Review of Systems: Changes? :No   Past medications for mental health diagnoses include: Ambien- Able to fall asleep. Sometimes still had dreams. Took for about 20 years. Xanax- Prescribed two 1 mg tabs for about 20 years. Effexor- Sleepy Zoloft- Has increased anxiety  Prozac Lexapro Cymbalta- initially seemed to be helpful and then developed trembling Wellbutrin XL- Reports that this has been helpful for her mood and hot flashes. Gabapentin-Prescribed for pain. Trazodone-Increased anxiety, nausea Prazosin-caused sedation even at 1 mg Modafinil was ineffective  Allergies: Prazosin and Codeine  Current Medications:  Current Outpatient Medications:    ARIPiprazole (ABILIFY) 2 MG tablet, Take 1 tablet (2 mg total) by mouth daily., Disp: 30 tablet, Rfl: 0   Aspirin-Acetaminophen-Caffeine (GOODY HEADACHE PO), Take by mouth., Disp: , Rfl:    atorvastatin (LIPITOR)  20 MG tablet, Take 1 tablet by mouth daily., Disp: 90 tablet, Rfl: 1   Biotin 1 MG CAPS, Take by mouth., Disp: , Rfl:    buPROPion (WELLBUTRIN XL) 150 MG 24 hr tablet, TAKE ONE TABLET BY MOUTH DAILY TAKE WITH 300MG  TO EQUAL A TOTAL DOSE OF 450MG , Disp: 90 tablet, Rfl: 1   buPROPion (WELLBUTRIN XL) 300 MG 24 hr tablet, Take 1 tablet (300 mg total) by mouth daily., Disp: 90 tablet, Rfl: 1   fluticasone (FLONASE) 50 MCG/ACT nasal spray, Place 2  sprays into both nostrils daily as needed for allergies or rhinitis., Disp: 16 g, Rfl: 11   gabapentin (NEURONTIN) 600 MG tablet, TAKE ONE TABLET BY MOUTH THREE TIMES DAILY, Disp: 270 tablet, Rfl: 1   methocarbamol (ROBAXIN) 500 MG tablet, TAKE 2 TABLETS BY MOUTH TWICE DAILY FOR MUSCLE SPAMS, Disp: 120 tablet, Rfl: 11   metoprolol tartrate (LOPRESSOR) 100 MG tablet, TAKE TWO TABLETS BY MOUTH DAILY, Disp: 180 tablet, Rfl: 1   Multiple Vitamin (MULTIVITAMIN) tablet, Take 1 tablet by mouth daily., Disp: , Rfl:    zolpidem (AMBIEN) 5 MG tablet, TAKE ONE TABLET BY MOUTH DAILY AT BEDTIME, Disp: 30 tablet, Rfl: 5   [START ON 09/04/2023] zolpidem (AMBIEN) 5 MG tablet, Take 1 tablet (5 mg total) by mouth at bedtime as needed for sleep., Disp: 30 tablet, Rfl: 2   ALPRAZolam (XANAX) 1 MG tablet, Take 0.5-1 tablets (0.5-1 mg total) by mouth 2 (two) times daily as needed for anxiety., Disp: 60 tablet, Rfl: 5 Medication Side Effects: none  Family Medical/ Social History: Changes? Yes not working at present.  MENTAL HEALTH EXAM:  Last menstrual period 07/12/2014.There is no height or weight on file to calculate BMI.  General Appearance: Casual and Well Groomed  Eye Contact:  Good  Speech:  Clear and Coherent and Normal Rate  Volume:  Normal  Mood:  Anxious and Depressed  Affect:  Depressed and Anxious  Thought Process:  Goal Directed and Descriptions of Associations: Circumstantial  Orientation:  Full (Time, Place, and Person)  Thought Content: Logical   Suicidal Thoughts:  No  Homicidal Thoughts:  No  Memory:  WNL  Judgement:  Good  Insight:  Good  Psychomotor Activity:  Normal  Concentration:  Concentration: Fair and Attention Span: Fair  Recall:  Good  Fund of Knowledge: Good  Language: Good  Assets:  Desire for Improvement Financial Resources/Insurance Housing Transportation Vocational/Educational  ADL's:  Intact  Cognition: WNL  Prognosis:  Good   DIAGNOSES:    ICD-10-CM   1. Severe  episode of recurrent major depressive disorder, without psychotic features (HCC)  F33.2     2. GAD (generalized anxiety disorder)  F41.1 ALPRAZolam (XANAX) 1 MG tablet    3. PTSD (post-traumatic stress disorder)  F43.10 ALPRAZolam (XANAX) 1 MG tablet    4. Insomnia, unspecified type  G47.00     5. Nightmares  F51.5       Receiving Psychotherapy: Yes with Stevphen Meuse, [facility]  RECOMMENDATIONS:   PDMP reviewed.  Modafinil filled 06/09/2023.  Ambien filled 05/29/2023.  Gabapentin filled 05/14/2023.  Xanax filled 05/14/2023. I provided 35 minutes of face to face time during this encounter, including time spent before and after the visit in records review, medical decision making, counseling pertinent to today's visit, and charting.   Dr. [name], one of the psychiatrists caring for her in PHP/IOP reached out to me yesterday.  I appreciate his input.  He mentioned other possible options for the  depression including adding an antipsychotic such as Abilify, TMS or ECT, or obtaining a Gene site test.  Also the possibility of adding mirtazapine, using that instead of the Ambien, even if at a later date.  Also a consideration is to remove one of the stimulants, of course the modafinil which she did agree to stop, or the Wellbutrin since it also has stimulant effects.  She has been on that for years and does not want to change that.    Kaidance and I discussed each at length and she agreed to try the Abilify.  She is very hesitant to change any of her other medications because she has had bad experiences with the trial and error in the past.  We discussed Gene site testing which can be helpful in deciding which medications that she would respond to, but in my experience it helps me know what "not" to use, not so much as what to use.  She prefers not to have that done at this time.  She does not want to change the Xanax or Ambien at this time, feels like the anxiety is too severe and the Ambien is absolutely  necessary for her to sleep.  I will leave those meds the same.  Discussed potential metabolic side effects associated with atypical antipsychotics.  Labs will need to be drawn periodically to monitor metabolic function. Discussed potential risk for movement side effects. Patient understands and accepts these risks and has been advised to contact office if any movement side effects occur.   She is tentatively to return to work this coming Friday.  She is nowhere near able to do that.  She will be in IOP for 2 to 3 weeks total and we are starting a new medication so I prefer her to stay out for another couple of months.  We will shoot for 10/05/2023 as a return to work date but hopefully she can go back before then.  I told her sometimes it is more difficult to go back to the longer she stays out but at the same time I want to make sure her medication changes are beneficial and the IOP program has helped before allowing her to return to work.  She understands and agrees.  Discontinue modafinil. She already discontinued prazosin.  Continue Xanax 1 mg, 1/2-1 p.o. twice daily as needed.  (I am giving her 60 pills instead of the 45 pills she has been getting.) Start Abilify 2 mg, 1 p.o. nightly a.m.  We may need to increase rapidly but for now I will start on the lowest dose. Continue Wellbutrin XL 150 mg +300 mg daily. Continue gabapentin 600 mg 3 times daily. Continue Ambien 5 mg, 1 p.o. nightly as needed sleep. Continue vitamins as on med list. Continue IOP. Continue therapy with Stevphen Meuse, [facility]. Return in 2 weeks.  Melony Overly, PA-C

## 2023-07-08 ENCOUNTER — Other Ambulatory Visit (HOSPITAL_COMMUNITY): Payer: Self-pay | Admitting: Licensed Clinical Social Worker

## 2023-07-08 DIAGNOSIS — F4310 Post-traumatic stress disorder, unspecified: Secondary | ICD-10-CM | POA: Diagnosis not present

## 2023-07-08 DIAGNOSIS — F332 Major depressive disorder, recurrent severe without psychotic features: Secondary | ICD-10-CM

## 2023-07-08 NOTE — Progress Notes (Signed)
Virtual Visit via Video Note   I connected with Hilltop F. Pais on 07/08/23 at  9:00 AM EDT by a video enabled telemedicine application and verified that I am speaking with the correct person using two identifiers.   At orientation to the IOP program, Case Manager discussed the limitations of evaluation and management by telemedicine and the availability of in person appointments. The patient expressed understanding and agreed to proceed with virtual visits throughout the duration of the program.   Location:  Patient: Patient Home Provider: Home Office   History of Present Illness: MDD and PTSD   Observations/Objective: Check In: Case Manager checked in with all participants to review discharge dates, insurance authorizations, work-related documents and needs from the treatment team regarding medications. Lorma stated needs and engaged in discussion.    Initial Therapeutic Activity: Counselor facilitated a check-in with Shawnetta to assess for safety, sobriety and medication compliance.  Counselor also inquired about Sianne's current emotional ratings, as well as any significant changes in thoughts, feelings or behavior since previous check in.  Eldonna presented for session on time and was alert, oriented x5, with no evidence or self-report of active SI/HI or A/V H.  Shritha reported compliance with medication and denied use of alcohol or illicit substances.  Gurtha reported scores of 6/10 for depression, 5/10 for anxiety, and 0/10 for anger/irritability.  Jeniffer denied any recent outbursts.  Donnajean reported that a struggle was having a panic attack yesterday when she got out of the house to visit her doctor.  She reported that she had also planned to go to [facility], and found that she became increasingly stressed, despite practicing deep breathing.  Jewel reported that her goal today is to clean the kitchen, do some laundry, then color for self-care.           Second Therapeutic Activity: Counselor  utilized a Cabin crew with group members today to guide discussion on topic of codependency.  This handout defined codependency as excessive emotional or psychological reliance upon someone who requires support on account of an illness or addiction.  It also explained how this issue presents in dysfunctional family systems, including behavior such as denying existence of problems, rigid boundaries on communication, strained trust, lack of individuality, and reinforcement of unhealthy coping mechanisms such as substance use.  Characteristics of co-dependent people were listed for assistance with identification, such as extreme need for approval/recognition, difficulty identifying feelings, poor communication, and more.  Members were also tasked with completing a questionnaire in order to identify signs of codependency and results were discussed afterward.  This handout also offered strategies for resolving co-dependency within one's network, including increased use of assertive communication skills in order to set appropriate boundaries.  Intervention was effective, as evidenced by [facility] actively participating in discussion on the subject, and completing codependency questionnaire, with 12 out of 20 positive responses.  Keon reported that although she hasn't grown up in a dysfunctional family with any codependent traits, there have been times when caring for her mother has been very stressful.  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  Jeannette reported that this became demanding, stating "I had to do a lot of driving in the winter, especially at nighttime and it was becoming a problem".  She reported that her goal will be to set realistic boundaries with  caretaking of her  mother to avoid overdependence, and practice "I" statements to express her feelings  and needs.     Assessment and Plan: Counselor recommends that [facility] remain in IOP treatment to better manage mental health symptoms, ensure stability and pursue completion of treatment plan goals. Counselor recommends adherence to crisis/safety plan, taking medications as prescribed, and following up with medical professionals if any issues arise.    Follow Up Instructions: Counselor will send Webex link for session tomorrow.  Shanitha was advised to call back or seek an in-person evaluation if the symptoms worsen or if the condition fails to improve as anticipated.   Collaboration of Care:   Medication Management AEB [name], NP or Dr. [name]                                          Case Manager AEB [name], CNA    Patient/Guardian was advised Release of Information must be obtained prior to any record release in order to collaborate their care with an outside provider. Patient/Guardian was advised if they have not already done so to contact the registration department to sign all necessary forms in order for [place] to release information regarding their care.    Consent: Patient/Guardian gives verbal consent for treatment and assignment of benefits for services provided during this visit. Patient/Guardian expressed understanding and agreed to proceed.   I provided 180 minutes of non-face-to-face time during this encounter.   [name], LCSW, LCAS 07/08/23

## 2023-07-09 ENCOUNTER — Other Ambulatory Visit (HOSPITAL_COMMUNITY): Payer: Self-pay | Admitting: Licensed Clinical Social Worker

## 2023-07-09 DIAGNOSIS — F332 Major depressive disorder, recurrent severe without psychotic features: Secondary | ICD-10-CM

## 2023-07-09 DIAGNOSIS — F4310 Post-traumatic stress disorder, unspecified: Secondary | ICD-10-CM | POA: Diagnosis not present

## 2023-07-09 NOTE — Progress Notes (Signed)
Virtual Visit via Video Note   I connected with Victoria Floyd on 07/09/23 at  9:00 AM EDT by a video enabled telemedicine application and verified that I am speaking with the correct person using two identifiers.   At orientation to the IOP program, Case Manager discussed the limitations of evaluation and management by telemedicine and the availability of in person appointments. The patient expressed understanding and agreed to proceed with virtual visits throughout the duration of the program.   Location:  Patient: Patient Home Provider: [facility]   History of Present Illness: MDD and PTSD   Observations/Objective: Check In: Case Manager checked in with all participants to review discharge dates, insurance authorizations, work-related documents and needs from the treatment team regarding medications. Victoria Floyd stated needs and engaged in discussion.    Initial Therapeutic Activity: Counselor facilitated a check-in with Victoria Floyd to assess for safety, sobriety and medication compliance.  Counselor also inquired about Victoria Floyd's current emotional ratings, as well as any significant changes in thoughts, feelings or behavior since previous check in.  Victoria Floyd presented for session on time and was alert, oriented x5, with no evidence or self-report of active SI/HI or A/V H.  Victoria Floyd reported compliance with medication and denied use of alcohol or illicit substances.  Victoria Floyd reported scores of 6/10 for depression, 2/10 for anxiety, and 0/10 for anger/irritability.  Victoria Floyd denied any recent outbursts or panic attacks.  Victoria Floyd denied any new struggles.  Victoria Floyd reported that a success was getting some housework done yesterday since it was raining all day.  Victoria Floyd reported that her goal today is to make spaghetti and meatballs for her mother as a treat.          Second Therapeutic Activity: Counselor introduced topic of stress management today.  Counselor provided definition of stress as feeling tense,  overwhelmed, worn out, and/or exhausted, and noted that in small amounts, stress can be motivating until things become too overwhelming to manage.  Counselor also explained how stress can be acute (brief but intense) or chronic (long-lasting) and this can impact the severity of symptoms one can experience in the physical, emotional, and behavioral categories.  Counselor inquired about members' specific stressors, how long they have been prevalent, and the various symptoms that tend to manifest as a result.  Counselor also offered several stress management strategies to help improve members' coping ability, including journaling, gratitude practice, relaxation techniques, and time management tips.  Counselor also explained that research has shown a strong support network composed of trusted family, friends, or community members can increase resilience in times of stress, and inquired about who members can reach out to for help in managing stressors.  Counselor encouraged members to consider discussing stressor 'red flags' with their close supports that can be monitored and strategies for assisting them in times of crisis.  Intervention was effective, as evidenced by Victoria Floyd actively participating in discussion on subject, reporting that her most significant stressors include keeping healthy, and lack of confidence.  Victoria Floyd was able to identify several warning signs related to stress, including hyperventilating, chest tightness, shaking, body heat, headaches, and foggy brain or feeling confused.  Victoria Floyd reported that her stress management goal is to practice some of her coping skills from therapy to help tolerate sense of discomfort when travelling out of the house to run errands.  Victoria Floyd also expressed receptiveness to several stress management strategies practiced today in session, including deep breathing, maintaining a 'stress tracker' in her journal, and progressive muscle relaxation.  Assessment and  Plan: Counselor recommends that Victoria Floyd remain in IOP treatment to better manage mental health symptoms, ensure stability and pursue completion of treatment plan goals. Counselor recommends adherence to crisis/safety plan, taking medications as prescribed, and following up with medical professionals if any issues arise.    Follow Up Instructions: Counselor will send Webex link for session tomorrow.  [name] was advised to call back or seek an in-person evaluation if the symptoms worsen or if the condition fails to improve as anticipated.   Collaboration of Care:   Medication Management AEB Victoria Jacks, NP or Dr. [name]                                          Case Manager AEB [name], CNA    Patient/Guardian was advised Release of Information must be obtained prior to any record release in order to collaborate their care with an outside provider. Patient/Guardian was advised if they have not already done so to contact the registration department to sign all necessary forms in order for [place] to release information regarding their care.    Consent: Patient/Guardian gives verbal consent for treatment and assignment of benefits for services provided during this visit. Patient/Guardian expressed understanding and agreed to proceed.   I provided 180 minutes of non-face-to-face time during this encounter.   [name], LCSW, LCAS 07/09/23

## 2023-07-10 ENCOUNTER — Other Ambulatory Visit (HOSPITAL_COMMUNITY): Payer: Self-pay

## 2023-07-10 ENCOUNTER — Telehealth (HOSPITAL_COMMUNITY): Payer: Self-pay | Admitting: Psychiatry

## 2023-07-13 ENCOUNTER — Other Ambulatory Visit (HOSPITAL_COMMUNITY): Payer: Self-pay | Admitting: Licensed Clinical Social Worker

## 2023-07-13 DIAGNOSIS — F4310 Post-traumatic stress disorder, unspecified: Secondary | ICD-10-CM

## 2023-07-13 DIAGNOSIS — F332 Major depressive disorder, recurrent severe without psychotic features: Secondary | ICD-10-CM

## 2023-07-13 NOTE — Progress Notes (Signed)
Virtual Visit via Video Note   I connected with Victoria Floyd on 07/13/23 at  9:00 AM EDT by a video enabled telemedicine application and verified that I am speaking with the correct person using two identifiers.   At orientation to the IOP program, Case Manager discussed the limitations of evaluation and management by telemedicine and the availability of in person appointments. The patient expressed understanding and agreed to proceed with virtual visits throughout the duration of the program.   Location:  Patient: Patient Home Provider: Home Office   History of Present Illness: MDD and PTSD   Observations/Objective: Check In: Case Manager checked in with all participants to review discharge dates, insurance authorizations, work-related documents and needs from the treatment team regarding medications. Victoria Floyd stated needs and engaged in discussion.    Initial Therapeutic Activity: Counselor facilitated a check-in with Victoria Floyd to assess for safety, sobriety and medication compliance.  Counselor also inquired about Victoria Floyd's current emotional ratings, as well as any significant changes in thoughts, feelings or behavior since previous check in.  Victoria Floyd presented for session on time and was alert, oriented x5, with no evidence or self-report of active SI/HI or A/V H.  Victoria Floyd reported compliance with medication and denied use of alcohol or illicit substances.  Victoria Floyd reported scores of 5/10 for depression, 9/10 for anxiety, and 0/10 for anger/irritability.  Victoria Floyd denied any recent outbursts or panic attacks.  Victoria Floyd reported that a success was getting some housework done over the weekend since it was raining.  She reported that a struggle was having her TV go out, although she was able to get a new one delivered.  Victoria Floyd reported that her goal today is to drive her mother to some errands after group.          Second Therapeutic Activity: Counselor engaged the group in discussion on managing work/life  balance today to improve mental health and wellness.  Counselor explained how finding balance between responsibilities at home and work place can be challenging, and lead to increased stress.  Counselor facilitated discussion on what challenges members are currently, or have historically faced.  Counselor also discussed strategies for improving work/life balance while members work on their mental health during treatment.  Some of these included keeping track of time management; creating a list of priorities and scaling importance; setting realistic, measurable goals each day; establishing boundaries; taking care of health needs; and nurturing relationships at home and work for support.  Counselor inquired about areas where members feel they are excelling, as well as areas they could focus on during treatment. Intervention was effective, as evidenced by Victoria Floyd, Victoria Floyd actively participating in discussion on topic and reporting that although she loves her job, it can be very demanding, and has led her to become 'time poor', as shifts can be long, exhausting, and leave little time for self-care at the end of the day.  Victoria Floyd stated "I have things I need to do after work, but I can't always get to them.  It feels like I run out of hours".  Victoria Floyd reported that she experienced several symptoms of burnout, including being unable to concentrate, lacking motivation, increased blood pressure, physical pain from overworking, and withdrawing from responsibilities.  Victoria Floyd reported that there have also been numerous warning signs such as not making time for important people in her support network, ruminating on work related tasks outside of normal hours,  and neglecting other aspects of her life because of work commitments.  Victoria Floyd was receptive to suggestions  offered today for addressing work life imbalance, including learning to say "No" to excessive responsibilities, ensuring that she is taking breaks during work shifts and using  that as an opportunity to get some exercise by walking around the building parking lot. Victoria Floyd stated "I've gotta take care of my body".     Assessment and Plan: Counselor recommends that [facility] remain in IOP treatment to better manage mental health symptoms, ensure stability and pursue completion of treatment plan goals. Counselor recommends adherence to crisis/safety plan, taking medications as prescribed, and following up with medical professionals if any issues arise.    Follow Up Instructions: Counselor will send Webex link for session tomorrow.  Victoria Floyd was advised to call back or seek an in-person evaluation if the symptoms worsen or if the condition fails to improve as anticipated.   Collaboration of Care:   Medication Management AEB [name], NP or Dr. [name]                                          Case Manager AEB [name], CNA    Patient/Guardian was advised Release of Information must be obtained prior to any record release in order to collaborate their care with an outside provider. Patient/Guardian was advised if they have not already done so to contact the registration department to sign all necessary forms in order for [place] to release information regarding their care.    Consent: Patient/Guardian gives verbal consent for treatment and assignment of benefits for services provided during this visit. Patient/Guardian expressed understanding and agreed to proceed.   I provided 180 minutes of non-face-to-face time during this encounter.   [name], LCSW, LCAS 07/13/23

## 2023-07-14 ENCOUNTER — Other Ambulatory Visit (HOSPITAL_COMMUNITY): Payer: Self-pay | Admitting: Psychiatry

## 2023-07-14 DIAGNOSIS — F4310 Post-traumatic stress disorder, unspecified: Secondary | ICD-10-CM

## 2023-07-14 DIAGNOSIS — F332 Major depressive disorder, recurrent severe without psychotic features: Secondary | ICD-10-CM

## 2023-07-14 NOTE — Progress Notes (Signed)
Virtual Visit via Video Note   I connected with Victoria Floyd on 07/14/23 at  9:00 AM EDT by a video enabled telemedicine application and verified that I am speaking with Victoria correct person using two identifiers.   At orientation to Victoria IOP program, Case Manager discussed Victoria limitations of evaluation and management by telemedicine and Victoria availability of in person appointments. Victoria patient expressed understanding and agreed to proceed with virtual visits throughout Victoria duration of Victoria program.   Location:  Patient: Patient Home Provider: OPT BH Office   History of Present Illness: MDD and PTSD   Observations/Objective: Check In: Case Manager checked in with all participants to review discharge dates, insurance authorizations, work-related documents and needs from Victoria treatment team regarding medications. Victoria Floyd stated needs and engaged in discussion.    Initial Therapeutic Activity: Counselor facilitated a check-in with Victoria Floyd to assess for safety, sobriety and medication compliance.  Counselor also inquired about Victoria Floyd's current emotional ratings, as well as any significant changes in thoughts, feelings or behavior since previous check in.  Victoria Floyd presented for session on time and was alert, oriented x5, with no evidence or self-report of active SI/HI or A/V H.  Victoria Floyd reported compliance with medication and denied use of alcohol or illicit substances.  Victoria Floyd reported scores of 7/10 for depression, 2/10 for anxiety, and 0/10 for anger/irritability.  Victoria Floyd denied any recent outbursts.  Victoria Floyd reported that a success was getting a few errands run with her mother yesterday.  Victoria Floyd reported that a struggle was having a panic attack when she felt overwhelmed by Victoria crowd in [employer].  Victoria Floyd reported that she practiced deep breathing, but while waiting in Victoria line to check out, she had to step out to Victoria car to calm down.  Victoria Floyd reported that her goal today is to take care of some chores around  Victoria house to be productive and distract her from ruminating on yesterday.          Second Therapeutic Activity: Counselor proposed practice of guided imagery exercise with members today to improve their relaxation and stress management skills.  This visualization featured a casual walk through a nature trail in a forest on a pleasant fall day.  Counselor invited members to get comfortable, achieve a relaxing breathing pattern, and then began narration of this visualization, including various sensory details (i.e. feeling of Victoria sun on Victoria skin, smell of pine trees, sound of running stream and breeze through leaves) to enhance experience over course of activity.  Counselor processed experience afterward with each member, inquiring about effectiveness of activity, any issues that may have arisen, and whether they intend to add this to self-care routine.  Intervention was effective, as evidenced by Victoria Floyd participating in activity successfully and reporting that she was able to imagine walking a familiar path near her home that she has visited many times over Victoria years.  Victoria Floyd reported that there is a park next to a lake which is peaceful, and details were easy to recall during Victoria exercise, and enhanced Victoria experience.  Victoria Floyd reported that Victoria visualization made her feel good, and she would try it again on her own for self-care when feeling overwhelmed.    Third Therapeutic Activity: Counselor introduced Victoria Floyd, Victoria Floyd to provide psychoeducation on topic of Grief and Loss with members today.  Victoria Floyd began discussion by checking in with Victoria group about their baseline mood today, general thoughts on what grief means to them and how it has affected  them personally in Victoria past.  Victoria Floyd provided information on how Victoria process of grief/loss can differ depending upon one's unique culture, and categories of loss one could experience (i.e. loss of a person, animal, relationship, job, identity, etc).   Victoria Floyd encouraged members to be mindful of how pervasive loss can be, and how to recognize signs which could indicate that this is having an impact on one's overall mental health and wellbeing.  Intervention was effective, as evidenced by Victoria Floyd participating in discussion with speaker on Victoria subject, reporting that grief is a significant issue in her life at present, and has led to difficult feelings such as sadness, emptiness, and depression.  Victoria Floyd reported that Victoria anniversary of her daughter's passing was recent, and stated "That's what started this whole thing for me".  Victoria Floyd expressed interest in pursuing individual grief counseling, although she reported reservations toward a group environment, stating "I'm not sure how that would help me".      Assessment and Plan: Counselor recommends that Victoria Floyd remain in IOP treatment to better manage mental health symptoms, ensure stability and pursue completion of treatment plan goals. Counselor recommends adherence to crisis/safety plan, taking medications as prescribed, and following up with medical professionals if any issues arise.    Follow Up Instructions: Counselor will send Webex link for session tomorrow.  Victoria Floyd was advised to call back or seek an in-person evaluation if Victoria symptoms worsen or if Victoria condition fails to improve as anticipated.   Collaboration of Care:   Medication Management AEB Victoria Jacks, NP or Victoria Floyd                                          Case Manager AEB Victoria Modena, CNA    Patient/Guardian was advised Release of Information must be obtained prior to any record release in order to collaborate their care with an outside provider. Patient/Guardian was advised if they have not already done so to contact Victoria registration department to sign all necessary forms in order for [place] to release information regarding their care.    Consent: Patient/Guardian gives verbal consent for treatment and assignment of benefits for  services provided during this visit. Patient/Guardian expressed understanding and agreed to proceed.   I provided 180 minutes of non-face-to-face time during this encounter.   [name], LCSW, LCAS 07/14/23

## 2023-07-14 NOTE — Telephone Encounter (Signed)
Pt called at 2:41p.  She had appt with Rosey Bath on 2/11 and [employer] needs updated paperwork with her new return to work date sent to them.  Next appt 2/25

## 2023-07-15 ENCOUNTER — Other Ambulatory Visit (HOSPITAL_COMMUNITY): Payer: Self-pay | Admitting: Psychiatry

## 2023-07-15 DIAGNOSIS — F332 Major depressive disorder, recurrent severe without psychotic features: Secondary | ICD-10-CM

## 2023-07-15 DIAGNOSIS — F4310 Post-traumatic stress disorder, unspecified: Secondary | ICD-10-CM

## 2023-07-15 NOTE — Telephone Encounter (Signed)
I have not received the new paperwork that I am aware of. May be with admin staff

## 2023-07-15 NOTE — Progress Notes (Signed)
Virtual Visit via Video Note   I connected with McCrory F. Kight on 07/15/23 at  9:00 AM EDT by a video enabled telemedicine application and verified that I am speaking with the correct person using two identifiers.   At orientation to the IOP program, Case Manager discussed the limitations of evaluation and management by telemedicine and the availability of in person appointments. The patient expressed understanding and agreed to proceed with virtual visits throughout the duration of the program.   Location:  Patient: Patient Home Provider: Home Office   History of Present Illness: MDD and PTSD   Observations/Objective: Check In: Case Manager checked in with all participants to review discharge dates, insurance authorizations, work-related documents and needs from the treatment team regarding medications. Amiracle stated needs and engaged in discussion.    Initial Therapeutic Activity: Counselor facilitated a check-in with Lamees to assess for safety, sobriety and medication compliance.  Counselor also inquired about Shaylah's current emotional ratings, as well as any significant changes in thoughts, feelings or behavior since previous check in.  Dominque presented for session on time and was alert, oriented x5, with no evidence or self-report of active SI/HI or A/V H.  Rex reported compliance with medication and denied use of alcohol or illicit substances.  Sherilee reported scores of 5/10 for depression, 0/10 for anxiety, and 0/10 for anger/irritability.  Dane denied any recent outbursts or panic attacks.  Maelle reported that a struggle was feeling exhausted after group yesterday and having to take a nap to recuperate.  Esra stated "Talking about grief always takes a lot out of me".  Audianna reported that a success was doing some coloring after she woke up for self-care.  Sanaya reported that her goal today is to stay inside since its supposed to snow, and color, as well as do some organizing  around the home to keep her mind preoccupied.          Second Therapeutic Activity: Counselor introduced [name], [employer] Pharmacist, to provide psychoeducation on topic of medication compliance with members today.  [name] provided psychoeducation on classes of medications such as antidepressants, antipsychotics, what symptoms they are intended to treat, and any side effects one might encounter while on a particular prescription.  Time was allowed for clients to ask any questions they might have of [facility] regarding this specialty.  Intervention was effective, as evidenced by [facility] participating in discussion with speaker on the subject, reporting that she has heard of the term 'off label' in regard to certain medications, but was unsure what this meant.  Barbara was receptive to clarity from pharmacist on what this means, and how certain medications that fall in this category may be utilized to treat symptoms, with close supervision from provider.    Third Therapeutic Activity: Psycho-educational portion of group was co-facilitated by wellness director (David Stall, MS, MPH, CHES) focused on self-care in daily life. Facilitator and group members discussed presented materials regarding importance of sleep, diet, and exercise. Group members discussed any changes they are willing to make to improve an area of self-care in their lives (physical, psychological, emotional, spiritual, relationship, professional) to improve overall mental health as they continue with treatment.  Intervention was effective, as evidenced by [facility] participating in discussion with speaker on the subject, reporting that she has not been actively exercising, but would like to make this a wellness goal as motivation increases through treatment of depression.  She reported that she enjoys cardio related activity, like walking her treadmill or swimming  in her pool when the weather is warmer.  Kruti reported that she is also interested in trying  to improve diet, and has heard of intermittent fasting, but will plan to speak with her PCP or a nutritionist if needed for guidance.   Assessment and Plan: Counselor recommends that [facility] remain in IOP treatment to better manage mental health symptoms, ensure stability and pursue completion of treatment plan goals. Counselor recommends adherence to crisis/safety plan, taking medications as prescribed, and following up with medical professionals if any issues arise.    Follow Up Instructions: Counselor will send Webex link for session tomorrow.  Haylyn was advised to call back or seek an in-person evaluation if the symptoms worsen or if the condition fails to improve as anticipated.   Collaboration of Care:   Medication Management AEB [name], NP or Dr. [name]                                          Case Manager AEB [name], CNA    Patient/Guardian was advised Release of Information must be obtained prior to any record release in order to collaborate their care with an outside provider. Patient/Guardian was advised if they have not already done so to contact the registration department to sign all necessary forms in order for [place] to release information regarding their care.    Consent: Patient/Guardian gives verbal consent for treatment and assignment of benefits for services provided during this visit. Patient/Guardian expressed understanding and agreed to proceed.   I provided 180 minutes of non-face-to-face time during this encounter.   [name], LCSW, LCAS 07/15/23

## 2023-07-16 ENCOUNTER — Encounter (HOSPITAL_COMMUNITY): Payer: Self-pay | Admitting: Psychiatry

## 2023-07-16 ENCOUNTER — Telehealth (HOSPITAL_COMMUNITY): Payer: Self-pay | Admitting: Psychiatry

## 2023-07-16 ENCOUNTER — Ambulatory Visit (HOSPITAL_COMMUNITY): Payer: Self-pay

## 2023-07-17 ENCOUNTER — Other Ambulatory Visit (HOSPITAL_COMMUNITY): Payer: Self-pay | Admitting: Licensed Clinical Social Worker

## 2023-07-17 DIAGNOSIS — F4310 Post-traumatic stress disorder, unspecified: Secondary | ICD-10-CM

## 2023-07-17 DIAGNOSIS — F332 Major depressive disorder, recurrent severe without psychotic features: Secondary | ICD-10-CM

## 2023-07-17 NOTE — Progress Notes (Signed)
Virtual Visit via Video Note   I connected with Greenhorn F. Heidrick on 07/17/23 at  9:00 AM EDT by a video enabled telemedicine application and verified that I am speaking with the correct person using two identifiers.   At orientation to the IOP program, Case Manager discussed the limitations of evaluation and management by telemedicine and the availability of in person appointments. The patient expressed understanding and agreed to proceed with virtual visits throughout the duration of the program.   Location:  Patient: Patient Home Provider: Home Office   History of Present Illness: MDD and PTSD   Observations/Objective: Check In: Case Manager checked in with all participants to review discharge dates, insurance authorizations, work-related documents and needs from the treatment team regarding medications. Brownie stated needs and engaged in discussion.    Initial Therapeutic Activity: Counselor facilitated a check-in with Dawanna to assess for safety, sobriety and medication compliance.  Counselor also inquired about Storie's current emotional ratings, as well as any significant changes in thoughts, feelings or behavior since previous check in.  Katalyna presented for session on time and was alert, oriented x5, with no evidence or self-report of active SI/HI or A/V H.  Rowena reported compliance with medication and denied use of alcohol or illicit substances.  Tinsleigh reported scores of 4/10 for depression, 0/10 for anxiety, and 0/10 for anger/irritability.  Lavonna denied any recent outbursts or panic attacks.  Annagrace reported that a struggle was experiencing a severe migraine yesterday, which led her to miss group in order to recuperate.  She reported that she also experienced a panic attack the previous night after waking up from a nightmare.  Venus reported that her goal this weekend is to visit her mother, surprise her with potato soup, and play cards together.    Second Therapeutic Activity:  Counselor introduced topic of mental illness stigma today.  Counselor explained how stigma is defined as someone viewing another person in a negative way due to distinguishing characteristics or traits which are thought to be a disadvantage, including negative beliefs or attitudes associated with people who have a mental health condition such as major depression and/or generalized anxiety.  Counselor explained that stigma can lead to discrimination as well, and the harmful effects of pervasive stigma include reluctance to seek help or treatment, lack of understanding by family, friends, co-workers or peers, bullying/harassment, or negative impact on one's perceived ability to overcome challenges or succeed in life.  Counselor offered several suggestions for coping with stigma regarding mental illness, including staying engaged in treatment through linkage with a therapist, psychiatrist, and/or support group, avoiding isolation, and speaking out against stigma when encountered to reverse societal impact and increase acceptance and understanding.  Counselor inquired about members' experiences with this issue, as well as what they have done to minimize the impact it can have on their lives.  Intervention was effective, as evidenced by [facility] actively engaging in discussion on the subject, reporting that she is a very private person and can be careful about talking about her mental health with others, including with coworkers at her job.  Ernestene stated "I do have a wall around me at the moment".  She reported that there are neighbors that live nearby who will gossip about mental health, so she is careful about opening up with them.  She reported that although her coworkers are younger, they seem to have a more positive, open attitude toward mental health and treatment.  Katalaya reported that she feels like being in [facility]  has encouraged her to be more open about her feelings and adjust boundaries to reduce overall stigma.   Ara reported that she is now more open to seeking a virtual grief group upon discharge from [facility] to help cope with the tragic loss of her daughter.   Assessment and Plan: Counselor recommends that [facility] remain in IOP treatment to better manage mental health symptoms, ensure stability and pursue completion of treatment plan goals. Counselor recommends adherence to crisis/safety plan, taking medications as prescribed, and following up with medical professionals if any issues arise.    Follow Up Instructions: Counselor will send Webex link for session tomorrow.  Claudina was advised to call back or seek an in-person evaluation if the symptoms worsen or if the condition fails to improve as anticipated.   Collaboration of Care:   Medication Management AEB [name], NP or Dr. [name]                                          Case Manager AEB [name], CNA    Patient/Guardian was advised Release of Information must be obtained prior to any record release in order to collaborate their care with an outside provider. Patient/Guardian was advised if they have not already done so to contact the registration department to sign all necessary forms in order for [place] to release information regarding their care.    Consent: Patient/Guardian gives verbal consent for treatment and assignment of benefits for services provided during this visit. Patient/Guardian expressed understanding and agreed to proceed.   I provided 180 minutes of non-face-to-face time during this encounter.   [name], LCSW, LCAS 07/17/23

## 2023-07-20 ENCOUNTER — Ambulatory Visit (HOSPITAL_COMMUNITY): Payer: Self-pay

## 2023-07-20 ENCOUNTER — Other Ambulatory Visit (HOSPITAL_COMMUNITY): Payer: Self-pay | Admitting: Psychiatry

## 2023-07-20 DIAGNOSIS — F4310 Post-traumatic stress disorder, unspecified: Secondary | ICD-10-CM | POA: Diagnosis not present

## 2023-07-20 DIAGNOSIS — F332 Major depressive disorder, recurrent severe without psychotic features: Secondary | ICD-10-CM

## 2023-07-20 NOTE — Progress Notes (Signed)
 Virtual Visit via Video Note   I connected with Victoria Floyd on 07/20/23 at  9:00 AM EDT by a video enabled telemedicine application and verified that I am speaking with the correct person using two identifiers.   At orientation to the IOP program, Case Manager discussed the limitations of evaluation and management by telemedicine and the availability of in person appointments. The patient expressed understanding and agreed to proceed with virtual visits throughout the duration of the program.   Location:  Patient: Patient Home Provider: [facility]   History of Present Illness: MDD and PTSD   Observations/Objective: Check In: Case Manager checked in with all participants to review discharge dates, insurance authorizations, work-related documents and needs from the treatment team regarding medications. Victoria Floyd stated needs and engaged in discussion.    Initial Therapeutic Activity: Counselor facilitated a check-in with Victoria Floyd to assess for safety, sobriety and medication compliance.  Counselor also inquired about Victoria Floyd's current emotional ratings, as well as any significant changes in thoughts, feelings or behavior since previous check in.  Victoria Floyd presented for session on time and was alert, oriented x5, with no evidence or self-report of active SI/HI or A/V H.  Victoria Floyd reported compliance with medication and denied use of alcohol or illicit substances.  Victoria Floyd reported scores of 3/10 for depression, 1/10 for anxiety, and 0/10 for anger/irritability.  Victoria Floyd denied any recent outbursts or panic attacks.  Victoria Floyd reported that a success was visiting her mother over the weekend to have a nice meal and play cards together.  Victoria Floyd reported that a struggle has been feeling emotional about her daughter's upcoming anniversary, stating "That's been on my mind a lot".   Victoria Floyd reported that her goal today is to get out of the house and run some errands with her mother, including going to the bank, [employer]  store, and a restaurant.    Second Therapeutic Activity: Counselor discussed topic of sleep hygiene today with group.  Counselor defined this as the habits, behaviors and environmental factors that can be adjusted to improve overall sleep quality.  Counselor also discussed how lack of consistent sleep can negatively affect mood and overall mental health.  Counselor provided members with a screening to complete assessing typical barriers one might face in achieving quality sleep at night (i.e. struggling to get up in the morning, waking up throughout the night, tossing/turning, etc), and inquired about members' perception of sleep hygiene at present.  Counselor offered techniques to members via a virtual handout which could be implemented in order to improve sleep hygiene, such as sticking to a normal morning/night routine, avoiding using of electronics too close to bedtime, avoiding heavy meals before bed, using a sleep journal to track changes and address anxious thoughts, as well as avoiding naps during the daytime, ensuring proper use of medications if prescribed any by provider(s), and more.  Counselor inquired about changes members intend to make to sleep hygiene based upon information covered today.  Interventions were effective, as evidenced by Victoria Floyd actively participating in discussion on subject, reporting that she has struggled with getting consistent, quality sleep for some time now, noting that she feels very anxious at bedtime, and worries about night terrors which can arise unexpectedly.  Victoria Floyd stated "I watch the clock as it gets closer to bedtime.  My anxiety is worst at night".  Victoria Floyd also completed a sleep assessment, which revealed that she is getting a 'moderate' level of sleep at present.  Victoria Floyd reported that she plans to improve  sleep hygiene over the course of treatment by avoiding daytime naps, keeping a consistent sleep/wake routine, avoiding excessive use of melatonin at night to  avoid dependency, developing sleep rituals at night that help her calm anxiety such as reading or doing deep breathing.    Assessment and Plan: Counselor recommends that [facility] remain in IOP treatment to better manage mental health symptoms, ensure stability and pursue completion of treatment plan goals. Counselor recommends adherence to crisis/safety plan, taking medications as prescribed, and following up with medical professionals if any issues arise.    Follow Up Instructions: Counselor will send Webex link for session tomorrow.  Victoria Floyd was advised to call back or seek an in-person evaluation if the symptoms worsen or if the condition fails to improve as anticipated.   Collaboration of Care:   Medication Management AEB [name], NP or Dr. [name]                                          Case Manager AEB [name], CNA    Patient/Guardian was advised Release of Information must be obtained prior to any record release in order to collaborate their care with an outside provider. Patient/Guardian was advised if they have not already done so to contact the registration department to sign all necessary forms in order for [place] to release information regarding their care.    Consent: Patient/Guardian gives verbal consent for treatment and assignment of benefits for services provided during this visit. Patient/Guardian expressed understanding and agreed to proceed.   I provided 180 minutes of non-face-to-face time during this encounter.   [name], LCSW, LCAS 07/20/23

## 2023-07-21 ENCOUNTER — Telehealth: Payer: 59 | Admitting: Physician Assistant

## 2023-07-21 ENCOUNTER — Encounter: Payer: Self-pay | Admitting: Physician Assistant

## 2023-07-21 DIAGNOSIS — G4700 Insomnia, unspecified: Secondary | ICD-10-CM

## 2023-07-21 DIAGNOSIS — F4310 Post-traumatic stress disorder, unspecified: Secondary | ICD-10-CM

## 2023-07-21 DIAGNOSIS — F411 Generalized anxiety disorder: Secondary | ICD-10-CM

## 2023-07-21 DIAGNOSIS — F332 Major depressive disorder, recurrent severe without psychotic features: Secondary | ICD-10-CM

## 2023-07-21 MED ORDER — ARIPIPRAZOLE 5 MG PO TABS: 5.0000 mg | ORAL_TABLET | Freq: Every day | ORAL | 1 refills | Status: DC

## 2023-07-21 NOTE — Progress Notes (Signed)
 [facility] Check  Patient ID: Victoria Floyd,  MRN: [id number]  PCP: Caesar Bookman, NP  Date of Evaluation: 07/21/2023 Time spent:25 minutes  Chief Complaint:  Chief Complaint   Anxiety; Depression; Follow-up   Virtual Visit via Telehealth  I connected with patient by a video enabled telemedicine application with their informed consent, and verified patient privacy and that I am speaking with the correct person using two identifiers.  I am private, in my office and the patient is at home.  I discussed the limitations, risks, security and privacy concerns of performing an evaluation and management service by video and the availability of in person appointments. I also discussed with the patient that there may be a patient responsible charge related to this service. The patient expressed understanding and agreed to proceed.   I discussed the assessment and treatment plan with the patient. The patient was provided an opportunity to ask questions and all were answered. The patient agreed with the plan and demonstrated an understanding of the instructions.   The patient was advised to call back or seek an in-person evaluation if the symptoms worsen or if the condition fails to improve as anticipated.  I provided 25  minutes of non-face-to-face time during this encounter.  HISTORY/CURRENT STATUS: HPI For 2 week med check.  We started Abilify 2 weeks ago.  "I really like it.  I have a little more energy and want to do things.  I have even cooked a few times."  Asked if the dose can be increased. She has gone to [employer] and then [employer] but did not do well in those stores.  She had to sit out in the car while her mom did the business in the store.  Several months ago she would not have even tried to go. She still feels very anxious around people, only comfortable with her mom. Xanax is very helpful.  Likes doing these appts online so she doesn't have to go out. Still doesn't feel like  working.  Is in IOP and finding it very helpful.  Personal hygiene is normal. Sleeps well w/ Ambien. Not having nightmares as bad as before.   Denies any changes in concentration, making decisions, or remembering things.  Appetite has not changed.  Weight is stable.  Denies suicidal or homicidal thoughts.  Patient denies increased energy with decreased need for sleep, increased talkativeness, racing thoughts, impulsivity or risky behaviors, increased spending, increased libido, grandiosity, increased irritability or anger, paranoia, or hallucinations.  Denies dizziness, syncope, seizures, numbness, tingling, tremor, tics, unsteady gait, slurred speech, confusion. Denies muscle or joint pain, stiffness, or dystonia. Denies unexplained weight loss, frequent infections, or sores that heal slowly.  No polyphagia, polydipsia, or polyuria. Denies visual changes or paresthesias.   Individual Medical History/ Review of Systems: Changes? :No   Past medications for mental health diagnoses include: Ambien- Able to fall asleep. Sometimes still had dreams. Took for about 20 years. Xanax- Prescribed two 1 mg tabs for about 20 years. Effexor- Sleepy Zoloft- Has increased anxiety  Prozac Lexapro Cymbalta- initially seemed to be helpful and then developed trembling Wellbutrin XL- Reports that this has been helpful for her mood and hot flashes. Gabapentin-Prescribed for pain. Trazodone-Increased anxiety, nausea Prazosin-caused sedation even at 1 mg Modafinil was ineffective  Allergies: Prazosin and Codeine  Current Medications:  Current Outpatient Medications:    ALPRAZolam (XANAX) 1 MG tablet, Take 0.5-1 tablets (0.5-1 mg total) by mouth 2 (two) times daily as needed for anxiety., Disp:  60 tablet, Rfl: 5   ARIPiprazole (ABILIFY) 5 MG tablet, Take 1 tablet (5 mg total) by mouth daily., Disp: 30 tablet, Rfl: 1   Aspirin-Acetaminophen-Caffeine (GOODY HEADACHE PO), Take by mouth., Disp: , Rfl:     atorvastatin (LIPITOR) 20 MG tablet, Take 1 tablet by mouth daily., Disp: 90 tablet, Rfl: 1   Biotin 1 MG CAPS, Take by mouth., Disp: , Rfl:    buPROPion (WELLBUTRIN XL) 150 MG 24 hr tablet, TAKE ONE TABLET BY MOUTH DAILY TAKE WITH 300MG  TO EQUAL A TOTAL DOSE OF 450MG , Disp: 90 tablet, Rfl: 1   buPROPion (WELLBUTRIN XL) 300 MG 24 hr tablet, Take 1 tablet (300 mg total) by mouth daily., Disp: 90 tablet, Rfl: 1   fluticasone (FLONASE) 50 MCG/ACT nasal spray, Place 2 sprays into both nostrils daily as needed for allergies or rhinitis., Disp: 16 g, Rfl: 11   gabapentin (NEURONTIN) 600 MG tablet, TAKE ONE TABLET BY MOUTH THREE TIMES DAILY, Disp: 270 tablet, Rfl: 1   methocarbamol (ROBAXIN) 500 MG tablet, TAKE 2 TABLETS BY MOUTH TWICE DAILY FOR MUSCLE SPAMS, Disp: 120 tablet, Rfl: 11   metoprolol tartrate (LOPRESSOR) 100 MG tablet, TAKE TWO TABLETS BY MOUTH DAILY, Disp: 180 tablet, Rfl: 1   Multiple Vitamin (MULTIVITAMIN) tablet, Take 1 tablet by mouth daily., Disp: , Rfl:    zolpidem (AMBIEN) 5 MG tablet, TAKE ONE TABLET BY MOUTH DAILY AT BEDTIME, Disp: 30 tablet, Rfl: 5   [START ON 09/04/2023] zolpidem (AMBIEN) 5 MG tablet, Take 1 tablet (5 mg total) by mouth at bedtime as needed for sleep., Disp: 30 tablet, Rfl: 2 Medication Side Effects: none  Family Medical/ Social History: Changes? Still not working at present.  MENTAL HEALTH EXAM:  Last menstrual period 07/12/2014.There is no height or weight on file to calculate BMI.  General Appearance: Casual and Well Groomed  Eye Contact:  Good  Speech:  Clear and Coherent and Normal Rate  Volume:  Normal  Mood:   sad  Affect:  Depressed  Thought Process:  Goal Directed and Descriptions of Associations: Circumstantial  Orientation:  Full (Time, Place, and Person)  Thought Content: Logical   Suicidal Thoughts:  No  Homicidal Thoughts:  No  Memory:  WNL  Judgement:  Good  Insight:  Good  Psychomotor Activity:  Normal  Concentration:  Concentration:  Good and Attention Span: Fair  Recall:  Good  Fund of Knowledge: Good  Language: Good  Assets:  Desire for Improvement Financial Resources/Insurance Housing Transportation Vocational/Educational  ADL's:  Intact  Cognition: WNL  Prognosis:  Good   DIAGNOSES:    ICD-10-CM   1. Severe episode of recurrent major depressive disorder, without psychotic features (HCC)  F33.2     2. GAD (generalized anxiety disorder)  F41.1     3. PTSD (post-traumatic stress disorder)  F43.10     4. Insomnia, unspecified type  G47.00      Receiving Psychotherapy: Yes with Stevphen Meuse, [facility]  RECOMMENDATIONS:   PDMP reviewed.  Modafinil filled 06/09/2023.  Ambien filled 07/13/2023.  Gabapentin filled 05/14/2023.  Xanax filled 07/07/2023. I provided 25 minutes of non-face-to-face time during this encounter, including time spent before and after the visit in records review, medical decision making, counseling pertinent to today's visit, and charting.   I'm glad to hear that she's doing better! We can increase the dose of Abilify, it's totally appropriate.  She's still not able to work, tentative RTW date 10/05/2023.   Continue Xanax 1 mg, 1/2-1 p.o. twice  daily as needed.  (I am giving her 60 pills instead of the 45 pills she has been getting.) Increase Abilify 2 mg, to 2 po every day until gone, then to 5 mg daily. Continue Wellbutrin XL 150 mg +300 mg daily. Continue gabapentin 600 mg 3 times daily. Continue Ambien 5 mg, 1 p.o. nightly as needed sleep. Continue vitamins as on med list. Continue IOP. Continue therapy with Stevphen Meuse, [facility]. Return in 4-6 wks.  Melony Overly, PA-C

## 2023-07-22 ENCOUNTER — Other Ambulatory Visit (HOSPITAL_COMMUNITY): Payer: Self-pay | Admitting: Psychiatry

## 2023-07-22 ENCOUNTER — Telehealth (HOSPITAL_COMMUNITY): Payer: Self-pay | Admitting: Psychiatry

## 2023-07-22 ENCOUNTER — Ambulatory Visit: Payer: 59 | Admitting: Psychiatry

## 2023-07-22 ENCOUNTER — Ambulatory Visit: Payer: 59 | Admitting: Physician Assistant

## 2023-07-23 ENCOUNTER — Other Ambulatory Visit (HOSPITAL_COMMUNITY): Payer: Self-pay | Admitting: Psychiatry

## 2023-07-23 DIAGNOSIS — F4310 Post-traumatic stress disorder, unspecified: Secondary | ICD-10-CM

## 2023-07-23 DIAGNOSIS — F332 Major depressive disorder, recurrent severe without psychotic features: Secondary | ICD-10-CM

## 2023-07-23 NOTE — Telephone Encounter (Signed)
 New paperwork received after 2/25 visit but was told to update previous paperwork instead with extended dates and any changes then to fax office notes per request.   Updated forms with information and given to Rosey Bath to review, initial, and sign

## 2023-07-23 NOTE — Progress Notes (Signed)
 Virtual Visit via Video Note   I connected with Victoria Floyd on 07/23/23 at  9:00 AM EDT by a video enabled telemedicine application and verified that I am speaking with the correct person using two identifiers.   At orientation to the IOP program, Case Manager discussed the limitations of evaluation and management by telemedicine and the availability of in person appointments. The patient expressed understanding and agreed to proceed with virtual visits throughout the duration of the program.   Location:  Patient: Patient Home Provider: Home Office   History of Present Illness: MDD and PTSD   Observations/Objective: Check In: Case Manager checked in with all participants to review discharge dates, insurance authorizations, work-related documents and needs from the treatment team regarding medications. Victoria Floyd stated needs and engaged in discussion.    Initial Therapeutic Activity: Counselor facilitated a check-in with Victoria Floyd to assess for safety, sobriety and medication compliance.  Counselor also inquired about Victoria Floyd's current emotional ratings, as well as any significant changes in thoughts, feelings or behavior since previous check in.  Victoria Floyd presented for session on time and was alert, oriented x5, with no evidence or self-report of active SI/HI or A/V H.  Victoria Floyd reported compliance with medication and denied use of alcohol or illicit substances.  Victoria Floyd reported scores of 4/10 for depression, 5/10 for anxiety, and 7/10 for irritability.  Victoria Floyd denied any recent outbursts or panic attacks.  Victoria Floyd reported that a struggle was going to an appointment the other day to turn in some paperwork, and having a disagreement with staff, which almost triggered an outburst.  Victoria Floyd reported that a success was getting out on Tuesday with her mother to run some errands and avoiding a panic attack.  Victoria Floyd reported that her goal today is to do some artwork, and household chores to stay busy.      Second  Therapeutic Activity: Counselor covered topic of core beliefs with group today.  Counselor virtually shared a handout on the subject, which explained how everyone looks at the world differently, and two people can have the same experience, but have different interpretations of what happened.  Members were encouraged to think of these like sunglasses with different "shades" influencing perception towards positive or negative outcomes.  Examples of negative core beliefs were provided, such as "I'm unlovable", "I'm not good enough", and "I'm a bad person".  Members were asked to share which one(s) they could relate to, and then identify evidence which contradicts these beliefs.  Counselor also provided psychoeducation on positive affirmations today.  Counselor explained how these are positive statements which can be spoken out loud or recited mentally to challenge negative thoughts and/or core beliefs to improve mood and outlook each day.  Counselor shared a comprehensive list of affirmations virtually to members with different categories, including ones for health, confidence, success, and happiness.  Counselor invited members to look through this list and identify any which resonated with them, and practice saying them out loud with sincerity.  Intervention was effective, as evidenced by Victoria Floyd successfully participating in discussion on the subject and reporting that she could relate to several negative core beliefs listed on the handout, such as "I am a failure", "I am unworthy", "I am weak", "I will end up alone", "I am losing at life", and "I am trapped".  Victoria Floyd was able to successfully challenge the core belief "I am trapped" by listing evidence that contradicts it, reporting that she has been able to get out of the house more often to  run errands, noticed that group therapy is having a positive impact upon mood, and she has increased support from her family and therapist.  Victoria Floyd also reported that she liked  several of the positive affirmations listed, such as "This too shall pass", "I embrace my flaws because I know that nobody is perfect", "Everything works out for the best possible good", and "I am open to all possibilities".  Victoria Floyd reported that this was a helpful topic to break down today, stating "I feel validated. I'm not alone in feeling this way".     Assessment and Plan: Counselor recommends that [facility] remain in IOP treatment to better manage mental health symptoms, ensure stability and pursue completion of treatment plan goals. Counselor recommends adherence to crisis/safety plan, taking medications as prescribed, and following up with medical professionals if any issues arise.    Follow Up Instructions: Counselor will send Webex link for session tomorrow.  Victoria Floyd was advised to call back or seek an in-person evaluation if the symptoms worsen or if the condition fails to improve as anticipated.   Collaboration of Care:   Medication Management AEB [name], NP or Dr. [name]                                          Case Manager AEB [name], CNA    Patient/Guardian was advised Release of Information must be obtained prior to any record release in order to collaborate their care with an outside provider. Patient/Guardian was advised if they have not already done so to contact the registration department to sign all necessary forms in order for [place] to release information regarding their care.    Consent: Patient/Guardian gives verbal consent for treatment and assignment of benefits for services provided during this visit. Patient/Guardian expressed understanding and agreed to proceed.   I provided 180 minutes of non-face-to-face time during this encounter.   [name], LCSW, LCAS 07/23/23

## 2023-07-24 ENCOUNTER — Ambulatory Visit (HOSPITAL_COMMUNITY): Payer: Self-pay | Admitting: Psychiatry

## 2023-07-24 ENCOUNTER — Telehealth: Payer: Self-pay | Admitting: Physician Assistant

## 2023-07-24 ENCOUNTER — Telehealth (HOSPITAL_COMMUNITY): Payer: Self-pay | Admitting: Psychiatry

## 2023-07-24 NOTE — Telephone Encounter (Signed)
 Duh..sorry. I wasn't thinking

## 2023-07-24 NOTE — Telephone Encounter (Signed)
 Pt LVM @ 11:03a stating that she needs a call back about whether an updated return to work date was sent to [employer].  She said she didn't get paid yesterday and she needs to make sure the info is update for her to get a pay check next time.  Next appt 4/3

## 2023-07-24 NOTE — Telephone Encounter (Signed)
 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

## 2023-07-27 ENCOUNTER — Other Ambulatory Visit (HOSPITAL_COMMUNITY): Payer: Self-pay | Admitting: Psychiatry

## 2023-07-27 ENCOUNTER — Telehealth (HOSPITAL_COMMUNITY): Payer: Self-pay | Admitting: Psychiatry

## 2023-07-28 ENCOUNTER — Other Ambulatory Visit (HOSPITAL_COMMUNITY): Payer: Self-pay | Attending: Psychiatry | Admitting: Psychiatry

## 2023-07-28 DIAGNOSIS — Z79899 Other long term (current) drug therapy: Secondary | ICD-10-CM | POA: Insufficient documentation

## 2023-07-28 DIAGNOSIS — F332 Major depressive disorder, recurrent severe without psychotic features: Secondary | ICD-10-CM | POA: Insufficient documentation

## 2023-07-28 DIAGNOSIS — F4310 Post-traumatic stress disorder, unspecified: Secondary | ICD-10-CM | POA: Insufficient documentation

## 2023-07-28 DIAGNOSIS — Z658 Other specified problems related to psychosocial circumstances: Secondary | ICD-10-CM | POA: Insufficient documentation

## 2023-07-28 NOTE — Progress Notes (Signed)
 Virtual Visit via Video Note   I connected with Victoria Floyd on 07/28/23 at  9:00 AM EDT by a video enabled telemedicine application and verified that I am speaking with the correct person using two identifiers.   At orientation to the IOP program, Case Manager discussed the limitations of evaluation and management by telemedicine and the availability of in person appointments. The patient expressed understanding and agreed to proceed with virtual visits throughout the duration of the program.   Location:  Patient: Patient Home Provider: OPT BH Office   History of Present Illness: MDD and PTSD   Observations/Objective: Check In: Case Manager checked in with all participants to review discharge dates, insurance authorizations, work-related documents and needs from the treatment team regarding medications. Victoria Floyd stated needs and engaged in discussion.    Initial Therapeutic Activity: Counselor facilitated a check-in with Victoria Floyd to assess for safety, sobriety and medication compliance.  Counselor also inquired about Victoria Floyd's current emotional ratings, as well as any significant changes in thoughts, feelings or behavior since previous check in.  Victoria Floyd presented for session on time and was alert, oriented x5, with no evidence or self-report of active SI/HI or A/V H.  Victoria Floyd reported compliance with medication and denied use of alcohol or illicit substances.  Victoria Floyd reported scores of 6/10 for depression, 6/10 for anxiety, and 0/10 for anger/irritability.  Victoria Floyd denied any recent outbursts.  Victoria Floyd reported that a recent struggle has been feeling sick, which led her to miss group yesterday.  She reported that current symptoms include nausea, headaches, fatigue, and scratchy throat, but she has not been to the doctor.  Victoria Floyd reported that a success was visiting her mother over the weekend.  Victoria Floyd reported that her goal today is to pick up a prescription from her doctor.         Second Therapeutic  Activity: Counselor introduced [employer], [place] Chaplain to provide psychoeducation on topic of Grief and Loss with members today.  Victoria Floyd began discussion by checking in with the group about their baseline mood today, general thoughts on what grief means to them and how it has affected them personally in the past.  Victoria Floyd provided information on how the process of grief/loss can differ depending upon one's unique culture, and categories of loss one could experience (i.e. loss of a person, animal, relationship, job, identity, etc).  Victoria Floyd encouraged members to be mindful of how pervasive loss can be, and how to recognize signs which could indicate that this is having an impact on one's overall mental health and wellbeing.  Intervention was effective, as evidenced by Victoria Floyd participating in discussion with speaker on the subject, reporting that this weekend was the anniversary of her daughter's passing, and this has been on her mind often.  Victoria Floyd reported that she perceives grief as "Overwhelming sadness, confusion, and a broken heart".  Victoria Floyd reported that group therapy has offered her a safe place to open up about her loss without judgment, and she is more open to the idea of continuing with grief therapy after discharge.    Third Therapeutic Activity: Counselor introduced topic of grounding skills today.  Counselor defined these as simple strategies one can use to help detach from difficult thoughts or feelings temporarily by focusing on something else.  Counselor noted that grounding will not solve the problem at hand, but can provide the practitioner with time to regain control over their thoughts and/or feelings and prevent the situation from getting worse (i.e. interrupting a panic attack).  Counselor divided these into three categories (mental, physical, and soothing) and then provided examples of each which group members could practice during session.  Some of these included describing one's  environment in detail or playing a categories game with oneself for mental category, taking a hot bath/shower, stretching, or carrying a grounding object for physical category, and saying kind statements, or visualizing people one cares about for soothing category.  Counselor inquired about which techniques members have used with success in the past, or will commit to learning, practicing, and applying now to improve coping abilities.  Intervention was effective, as evidenced by Victoria Floyd participating in discussion on the subject, trying out several of the techniques during session, and expressing interest in adding several to her available coping skills, such as describing her environment in detail, counting various objects around her, practicing deep breathing, playing a categories game involving listing breeds of cats, describing an everyday activity in great detail, imagining herself relaxing at the beach, listening to an interesting audiobook, watching funny videos on social media that can make her laugh, counting to 10, taking a hot shower, fidgeting with objects around her, using her ring as a grounding object, stretching her muscles, looking at a favorite picture of her daughter, reciting a favorite Nature conservation officer, or treating herself to a nice steak.    Assessment and Plan: Counselor recommends that [facility] remain in IOP treatment to better manage mental health symptoms, ensure stability and pursue completion of treatment plan goals. Counselor recommends adherence to crisis/safety plan, taking medications as prescribed, and following up with medical professionals if any issues arise.    Follow Up Instructions: Counselor will send Webex link for session tomorrow.  Victoria Floyd was advised to call back or seek an in-person evaluation if the symptoms worsen or if the condition fails to improve as anticipated.   Collaboration of Care:   Medication Management AEB Victoria Jacks, NP or Victoria Floyd                                           Case Manager AEB Victoria Modena, CNA    Patient/Guardian was advised Release of Information must be obtained prior to any record release in order to collaborate their care with an outside provider. Patient/Guardian was advised if they have not already done so to contact the registration department to sign all necessary forms in order for [place] to release information regarding their care.    Consent: Patient/Guardian gives verbal consent for treatment and assignment of benefits for services provided during this visit. Patient/Guardian expressed understanding and agreed to proceed.   I provided 180 minutes of non-face-to-face time during this encounter.   [name], LCSW, LCAS 07/28/23

## 2023-07-29 ENCOUNTER — Other Ambulatory Visit (HOSPITAL_COMMUNITY): Payer: Self-pay | Admitting: Psychiatry

## 2023-07-29 DIAGNOSIS — F332 Major depressive disorder, recurrent severe without psychotic features: Secondary | ICD-10-CM | POA: Diagnosis not present

## 2023-07-29 DIAGNOSIS — F4310 Post-traumatic stress disorder, unspecified: Secondary | ICD-10-CM

## 2023-07-29 NOTE — Progress Notes (Signed)
 Virtual Visit via Video Note   I connected with Victoria Floyd on 07/29/23 at  9:00 AM EDT by a video enabled telemedicine application and verified that I am speaking with the correct person using two identifiers.   At orientation to the IOP program, Case Manager discussed the limitations of evaluation and management by telemedicine and the availability of in person appointments. The patient expressed understanding and agreed to proceed with virtual visits throughout the duration of the program.   Location:  Patient: Patient Home Provider: Home Office   History of Present Illness: MDD and PTSD   Observations/Objective: Check In: Case Manager checked in with all participants to review discharge dates, insurance authorizations, work-related documents and needs from the treatment team regarding medications. Victoria Floyd stated needs and engaged in discussion.    Initial Therapeutic Activity: Counselor facilitated a check-in with Victoria Floyd to assess for safety, sobriety and medication compliance.  Counselor also inquired about Victoria Floyd's current emotional ratings, as well as any significant changes in thoughts, feelings or behavior since previous check in.  Victoria Floyd presented for session on time and was alert, oriented x5, with no evidence or self-report of active SI/HI or A/V H.  Victoria Floyd reported compliance with medication and denied use of alcohol or illicit substances.  Victoria Floyd reported scores of 3/10 for depression, 5/10 for anxiety, and 0/10 for anger/irritability.  Victoria Floyd denied any recent outbursts or panic attacks.  Victoria Floyd denied any new struggles.  Victoria Floyd reported that a success was getting out of the house yesterday to visit the pharmacy.  Victoria Floyd reported that her goal today is to stay inside and relax since the weather is rainy and windy.          Second Therapeutic Activity: Counselor introduced Victoria Floyd, [employer] Pharmacist, to provide psychoeducation on topic of medication compliance with members today.   Victoria Floyd provided psychoeducation on classes of medications such as antidepressants, antipsychotics, what symptoms they are intended to treat, and any side effects one might encounter while on a particular prescription.  Time was allowed for clients to ask any questions they might have of Victoria Floyd regarding this specialty.  Intervention was effective, as evidenced by Victoria Floyd participating in discussion with speaker on the subject, reporting that she was recently started on Abilify for depression, and noticed that she has noticed some improvement in mood, although motivation can be a struggle some days.  Victoria Floyd was receptive to feedback from pharmacist on how this medication is intended to treat symptoms.     Third Therapeutic Activity: Counselor discussed topic of distress tolerance today with group.  Counselor shared virtual handout with members that offered a DBT approach represented by the acronym ACCEPTS, and outlined strategies for distracting oneself from distressing emotions, allowing appropriate time for these emotions to lesson in intensity and eventually fade away.  Strategies offered included engaging in positive activities, contributing to the wellbeing of others, comparing one's present situation to a previously difficult one to highlight resilience, using mental imagery, and physical grounding.  Counselor assisted members in creating their own realistic ACCEPTS plan for tackling a distressing emotion of their choice.  Intervention was effective, as evidenced by Victoria Floyd participating in creation of the plan, choosing overwhelmed as her emotion of focus, and identifying several helpful approaches for distraction, such as coloring, watching television, cooking a meal for her mother, comparing her current situation to the past to recognize progress she has made, practice deep breathing, visualizing herself relaxing at the beach, counting various objects around her, singing an uplifting  song, or taking a hot  shower.    Assessment and Plan: Counselor recommends that Victoria Floyd remain in IOP treatment to better manage mental health symptoms, ensure stability and pursue completion of treatment plan goals. Counselor recommends adherence to crisis/safety plan, taking medications as prescribed, and following up with medical professionals if any issues arise.    Follow Up Instructions: Counselor will send Webex link for session tomorrow.  Victoria Floyd was advised to call back or seek an in-person evaluation if the symptoms worsen or if the condition fails to improve as anticipated.   Collaboration of Care:   Medication Management AEB Victoria Jacks, NP or Dr. Park Floyd                                          Case Manager AEB Victoria Modena, CNA    Patient/Guardian was advised Release of Information must be obtained prior to any record release in order to collaborate their care with an outside provider. Patient/Guardian was advised if they have not already done so to contact the registration department to sign all necessary forms in order for [place] to release information regarding their care.    Consent: Patient/Guardian gives verbal consent for treatment and assignment of benefits for services provided during this visit. Patient/Guardian expressed understanding and agreed to proceed.   I provided 180 minutes of non-face-to-face time during this encounter.   Victoria Stain, LCSW, LCAS 07/29/23

## 2023-07-30 ENCOUNTER — Encounter (HOSPITAL_COMMUNITY): Payer: Self-pay | Admitting: Psychiatry

## 2023-07-30 ENCOUNTER — Telehealth (HOSPITAL_COMMUNITY): Payer: Self-pay | Admitting: Psychiatry

## 2023-07-30 ENCOUNTER — Other Ambulatory Visit (HOSPITAL_COMMUNITY)

## 2023-07-30 NOTE — Patient Instructions (Addendum)
 D:  Patient is scheduled to be discharged from virtual MH-IOP on 07-31-23.  A:  Discharge on 07-31-23.  Follow up with Melony Overly, PA on 08-12-23  and Stevphen Meuse, [facility] on 08-19-23 @ 4 pm.  Return to work when [employer].  Strongly recommend support groups through [facility] [phone] and a grief/loss support group.  R:  Patient receptive.

## 2023-07-31 NOTE — Progress Notes (Signed)
 Virtual Visit via Video Note  I connected with Victoria Floyd on @TODAY @ at  9:00 AM EST by a video enabled telemedicine application and verified that I am speaking with the correct person using two identifiers.  Location: Patient: at home Provider: at home office   I discussed the limitations of evaluation and management by telemedicine and the availability of in person appointments. The patient expressed understanding and agreed to proceed.   I discussed the assessment and treatment plan with the patient. The patient was provided an opportunity to ask questions and all were answered. The patient agreed with the plan and demonstrated an understanding of the instructions.   The patient was advised to call back or seek an in-person evaluation if the symptoms worsen or if the condition fails to improve as anticipated.  I provided 15 minutes of non-face-to-face time during this encounter.   [name], M.Ed,CNA   Patient ID: Victoria Floyd, female   DOB: 19-Jun-1965, 58 y.o.   MRN: [id number] This is a 58 yr old, divorced, employed, Caucasian female, who was referred per Stevphen Meuse, [facility]; treatment for worsening PTSD sx's.  Reports the sx's worsened after Christmas 2024.  Denies SI/HI or A/V hallucinations.  Stressors:  1) Unresolved grief/loss issues:  On 07-25-17 pt's 31 yr old daughter committed suicide by way of gunshot.  Pt states her daughter was very intoxicated and had an argument with her boyfriend.  "I really don't think she would've killed herself if she wouldn't had been intoxicated."  2) Job of 8 yrs:  Pt has been writtern up d/t missing a lot of days.  "I missed a lot d/t having nightmares and migraines.  Pt has been out on medical leave since 06-08-23.  Pt states she loves her job and recently got a promotion, since she signed up for [employer].  3) Housing:  pt states there are a number of things that needs to be done around the home.  States she will be hiring someone to assist with  that.  Pt denies any prior psychiatric hosptializations or suicidal/self-injurious behaviors.  Has been seeing Stevphen Meuse, [facility] for ~ two yrs; just recently started seeing Melony Overly, PA, since Corie Chiquito, [name] left practice.  Denies family hx of mental illness.  Reports support system is her mother who resides in town and son, but he resides three hrs away.   Current Symptoms/Problems: Sadness, anhedonia, nightmares, low energy, decreased self-esteem, decreased concentration, irritable, isolative, intrusive thoughts during the nightmares    Patient attended virtual MH-IOP eleven days out of twenty.  Pt called and left vm this morning that she wouldn't be able to attend on her last day this morning d/t continued illness (stomach virus).  States she was planning to go to the doctor this morning.  Pt thanked the case manager and said the groups were very helpful.  On a scale of 1-10 (10 being the highest) pt rates her depression at a 3 and anxiety at a 5. Denied any SI/HI or A/V hallucinations.  A:  Discharged as planned.  F/U with Melony Overly, PA on 08-12-23 and Stevphen Meuse, [facility] on 08-19-23 @ 4pm.  Pt to rtw as per stated by Rosey Bath or the short term disability company. Pt was advised of ROI must be obtained prior to any records release in order to collaborate her care with an outside provider.  Pt was advised if she has not already done so to contact the front desk to sign all necessary forms  in order for MH-IOP to release info re: her care.  Consent:  Pt gives verbal consent for tx and assignment of benefits for services provided during this telehealth group process.  Pt expressed understanding and agreed to proceed. Collaboration of care:  Collaborate with [name], NP AEB; Dr. [name] AEB; Teresa Hurst,PA  AEB; Stevphen Meuse, [facility] AEB; [name], LCSW AEB.   Encouraged support groups through [facility] [employer] [facility] [employer].  R:  Pt receptive.  [name], M.Ed,CNA

## 2023-08-01 NOTE — Progress Notes (Signed)
  [facility] Intensive Outpatient Program Discharge Summary  ROSAMARIA Floyd [id number]  Admission date: 07/06/2023 Discharge date: 07/31/2023  Reason for admission: Per admission assessment: "  Victoria Floyd is a 58 y.o. female with history of MDD, PTSD is presenting to IOP due to worsening depression in setting of multiple psychosocial stressors including upcoming anniversary of daughter's suicide and missing work due to nightmares resulting in missing several days. Her current medication regiment has a combination of stimulating (bupropion and modafinil) and anxiolyitic medication (xanax, gabapentin, ambien). Some concern for polypharmacy and it appears she has been on xanax and ambien for over 20 years. She was also started on prazosin 2 mg at bedtime for nightmares. Of note, she is not on a SSRI/SNRI due prior side effects including sleepiness, increased anxiety, trembling to name a few nor has she been on an antipsychotic for adjunct treatment to antidepressant. She has not explore interventional options including ECT and TMS.  She also has not had GeneSight testing before."    Progress in Program Toward Treatment Goals: Progressing multiple attempts to contact patient at discharge no answer.  It was reported that patient was not feeling well and may have been seen and evaluated at the local emergency department according to case management.  Patient to keep all outpatient follow-up appointments as she chart reviewed patient is currently followed by Melony Overly physician assistant.  Progress (rationale): Take all of you medications as prescribed by your mental healthcare provider.  Report any adverse effects and reactions from your medications to your outpatient provider promptly.  Do not engage in alcohol and or illegal drug use while on prescription medicines. Keep all scheduled appointments. This is to ensure that you are getting refills on time and to avoid any interruption in  your medication.  If you are unable to keep an appointment call to reschedule.  Be sure to follow up with resources and follow ups given. In the event of worsening symptoms call the crisis hotline, 911, and or go to the nearest emergency department for appropriate evaluation and treatment of symptoms. Follow-up with your primary care provider for your medical issues, concerns and or health care needs.    Collaboration of Care: Other continue medications as directed  Patient/Guardian was advised Release of Information must be obtained prior to any record release in order to collaborate their care with an outside provider. Patient/Guardian was advised if they have not already done so to contact the registration department to sign all necessary forms in order for [place] to release information regarding their care.   Consent: Patient/Guardian gives verbal consent for treatment and assignment of benefits for services provided during this visit. Patient/Guardian expressed understanding and agreed to proceed.   [name] NP  08/01/2023

## 2023-08-03 ENCOUNTER — Other Ambulatory Visit (HOSPITAL_COMMUNITY): Payer: Self-pay

## 2023-08-12 ENCOUNTER — Ambulatory Visit: Admitting: Physician Assistant

## 2023-08-19 ENCOUNTER — Ambulatory Visit: Payer: Managed Care, Other (non HMO) | Admitting: Psychiatry

## 2023-08-19 DIAGNOSIS — F331 Major depressive disorder, recurrent, moderate: Secondary | ICD-10-CM

## 2023-08-19 NOTE — Progress Notes (Unsigned)
 [facility] Counselor/Therapist Progress Note  Patient ID: Victoria Floyd, MRN: [id number],    Date: 08/19/2023  Time Spent: 55 minutes start time 4:05 PM end time 5 PM  Treatment Type: Individual Therapy  Reported Symptoms: depression, nightmares, sleep issues, anxiety, panic, fatigue,flashbacks  Mental Status Exam:  Appearance:   Well Groomed     Behavior:  Appropriate  Motor:  Normal  Speech/Language:   Normal Rate  Affect:  Appropriate teraful  Mood:  anxious and depressed  Thought process:  circumstantial  Thought content:    WNL  Sensory/Perceptual disturbances:    WNL  Orientation:  oriented to person, place, time/date, and situation  Attention:  Good  Concentration:  Good  Memory:  WNL  Fund of knowledge:   Good  Insight:    Good  Judgment:   Good  Impulse Control:  Good   Risk Assessment: Danger to Self:  No Self-injurious Behavior: No Danger to Others: No Duty to Warn:no Physical Aggression / Violence:No  Access to Firearms a concern: No  Gang Involvement:No   Subjective: Patient was present for session. She shared she had completed her IOP program and enjoyed realizing others had problems as well.  Patient stated she is still having lots of anxiety and panic and struggling with leaving the house.  Patient explained she is not sure how to progress and get things moving in a better direction.  Encouraged her to try to recognize at this point being by herself in her home is what feels normal so it is going to be difficult to do something different.  Encouraged her to start taking small steps to getting out during the day and times when there is not a lot of people so that she can start desensitizing herself to being around people.  Patient went on to share the things that she felt most disturbed about is she has not been able to go to the basement in her house in years.  Did processing set on that issue suds level 9, negative cognition "I let the plants die"  felt sadness in her heart and stomach.  Patient was able to reduce suds to 8.  Through the processing she was able to realize that there are multiple good memories in the basement not just the plants that were given to her from Rayne's funeral.  Patient was encouraged to realize that she can feel a connection with the rain by going to the basement so that may be a very positive thing for her.  Patient agreed to try and just keep the door open some during the day for now and then take it 1 step at a time.  Interventions: Solution-Oriented/Positive Psychology, Eye Movement Desensitization and Reprocessing (EMDR), and Insight-Oriented  Diagnosis:   ICD-10-CM   1. Major depressive disorder, recurrent episode, moderate (HCC)  F33.1       Plan: Patient is to practice coping skills.  Patient is to try and open the door to the basement for a few minutes each day and start trying to think about the positive memories from the basement.  Patient is also to work on getting out of the house and going to safe places for at least a few minutes a few times a week..  Patient is to try and find ways to get out of her room including going to her mothers in the store.  Patient is to continue identifying cognitive distortions and managing them.  The patient is to take medication  as directed.  Patient is to try doing a brain dump sort of journaling prior to sleep   Stevphen Meuse, [facility]

## 2023-08-20 ENCOUNTER — Ambulatory Visit (INDEPENDENT_AMBULATORY_CARE_PROVIDER_SITE_OTHER): Admitting: Physician Assistant

## 2023-08-20 ENCOUNTER — Encounter: Payer: Self-pay | Admitting: Physician Assistant

## 2023-08-20 ENCOUNTER — Telehealth: Payer: Self-pay | Admitting: Physician Assistant

## 2023-08-20 DIAGNOSIS — F4310 Post-traumatic stress disorder, unspecified: Secondary | ICD-10-CM | POA: Diagnosis not present

## 2023-08-20 DIAGNOSIS — F332 Major depressive disorder, recurrent severe without psychotic features: Secondary | ICD-10-CM

## 2023-08-20 DIAGNOSIS — F411 Generalized anxiety disorder: Secondary | ICD-10-CM

## 2023-08-20 DIAGNOSIS — G4700 Insomnia, unspecified: Secondary | ICD-10-CM

## 2023-08-20 MED ORDER — ZOLPIDEM TARTRATE 10 MG PO TABS: 10.0000 mg | ORAL_TABLET | Freq: Every evening | ORAL | 2 refills | Status: DC | PRN

## 2023-08-20 MED ORDER — ARIPIPRAZOLE 2 MG PO TABS: 2.0000 mg | ORAL_TABLET | Freq: Every day | ORAL | 1 refills | Status: DC

## 2023-08-20 MED ORDER — ARIPIPRAZOLE 5 MG PO TABS: 5.0000 mg | ORAL_TABLET | Freq: Every day | ORAL | 1 refills | Status: DC

## 2023-08-20 NOTE — Progress Notes (Signed)
 [facility]  Patient ID: Victoria Floyd,  MRN: [id number]  PCP: Caesar Bookman, NP  Date of Evaluation: 08/20/2023 Time spent:20 minutes  Chief Complaint:  Chief Complaint   Anxiety; Depression; Insomnia; Follow-up    HISTORY/CURRENT STATUS: HPI  For routine med check.   Finished IOP since our last visit and found it to be helpful. Wishes she could stay in it, or have 'another round.' Is feeling slightly better than before as far as the melancholy goes. She has wanted to do more things, like cook or do things around the house. The Abilify has helped but thinks it could help even more if increasing the dose is an option. She still is very anxious when going out of the house. Went to [employer] with her mom and she couldn't stand being there.  Was so nervous and panicky she had to leave.  Her home is her safe place.  Still feels sad.  Unable to work, "I can't go back yet. I can't be around people." Cries easily.  Sleep isn't very good.  Ambien not as effective as it once was.   ADLs and personal hygiene are normal.   Denies any changes in concentration, making decisions, or remembering things.  Appetite has not changed.  Denies suicidal or homicidal thoughts.  Patient denies increased energy with decreased need for sleep, increased talkativeness, racing thoughts, impulsivity or risky behaviors, increased spending, increased libido, grandiosity, increased irritability or anger, paranoia, or hallucinations.  Denies dizziness, syncope, seizures, numbness, tingling, tremor, tics, unsteady gait, slurred speech, confusion. Denies muscle or joint pain, stiffness, or dystonia.  Individual Medical History/ Review of Systems: Changes? :No   Past medications for mental health diagnoses include: Ambien- Able to fall asleep. Sometimes still had dreams. Took for about 20 years. Xanax- Prescribed two 1 mg tabs for about 20 years. Effexor- Sleepy Zoloft- Has increased anxiety   Prozac Lexapro Cymbalta- initially seemed to be helpful and then developed trembling Wellbutrin XL- Reports that this has been helpful for her mood and hot flashes. Gabapentin-Prescribed for pain. Trazodone-Increased anxiety, nausea Prazosin-caused sedation even at 1 mg Modafinil was ineffective  Allergies: Prazosin and Codeine  Current Medications:  Current Outpatient Medications:    ALPRAZolam (XANAX) 1 MG tablet, Take 0.5-1 tablets (0.5-1 mg total) by mouth 2 (two) times daily as needed for anxiety., Disp: 60 tablet, Rfl: 5   ARIPiprazole (ABILIFY) 2 MG tablet, Take 1 tablet (2 mg total) by mouth daily. With 5 mg=7 mg, Disp: 90 tablet, Rfl: 1   Aspirin-Acetaminophen-Caffeine (GOODY HEADACHE PO), Take by mouth., Disp: , Rfl:    atorvastatin (LIPITOR) 20 MG tablet, Take 1 tablet by mouth daily., Disp: 90 tablet, Rfl: 1   Biotin 1 MG CAPS, Take by mouth., Disp: , Rfl:    buPROPion (WELLBUTRIN XL) 150 MG 24 hr tablet, TAKE ONE TABLET BY MOUTH DAILY TAKE WITH 300MG  TO EQUAL A TOTAL DOSE OF 450MG , Disp: 90 tablet, Rfl: 1   buPROPion (WELLBUTRIN XL) 300 MG 24 hr tablet, Take 1 tablet (300 mg total) by mouth daily., Disp: 90 tablet, Rfl: 1   fluticasone (FLONASE) 50 MCG/ACT nasal spray, Place 2 sprays into both nostrils daily as needed for allergies or rhinitis., Disp: 16 g, Rfl: 11   gabapentin (NEURONTIN) 600 MG tablet, TAKE ONE TABLET BY MOUTH THREE TIMES DAILY, Disp: 270 tablet, Rfl: 1   methocarbamol (ROBAXIN) 500 MG tablet, TAKE 2 TABLETS BY MOUTH TWICE DAILY FOR MUSCLE SPAMS, Disp: 120 tablet, Rfl: 11  metoprolol tartrate (LOPRESSOR) 100 MG tablet, TAKE TWO TABLETS BY MOUTH DAILY, Disp: 180 tablet, Rfl: 1   Multiple Vitamin (MULTIVITAMIN) tablet, Take 1 tablet by mouth daily., Disp: , Rfl:    zolpidem (AMBIEN) 10 MG tablet, Take 1 tablet (10 mg total) by mouth at bedtime as needed for sleep., Disp: 30 tablet, Rfl: 2   ARIPiprazole (ABILIFY) 5 MG tablet, Take 1 tablet (5 mg total) by  mouth daily. With 2 mg=7 mg, Disp: 90 tablet, Rfl: 1 Medication Side Effects: none  Family Medical/ Social History: Changes? No  MENTAL HEALTH EXAM:  Last menstrual period 07/12/2014.There is no height or weight on file to calculate BMI.  General Appearance: Casual and Well Groomed  Eye Contact:  Good  Speech:  Clear and Coherent and Normal Rate  Volume:  Normal  Mood:  Anxious  Affect:  Congruent  Thought Process:  Goal Directed and Descriptions of Associations: Circumstantial  Orientation:  Full (Time, Place, and Person)  Thought Content: Logical   Suicidal Thoughts:  No  Homicidal Thoughts:  No  Memory:  WNL  Judgement:  Good  Insight:  Good  Psychomotor Activity:  Normal  Concentration:  Concentration: Good and Attention Span: Fair  Recall:  Good  Fund of Knowledge: Good  Language: Good  Assets:  Desire for Improvement Financial Resources/Insurance Housing Transportation Vocational/Educational  ADL's:  Intact  Cognition: WNL  Prognosis:  Good   DIAGNOSES:    ICD-10-CM   1. Severe episode of recurrent major depressive disorder, without psychotic features (HCC)  F33.2     2. GAD (generalized anxiety disorder)  F41.1     3. PTSD (post-traumatic stress disorder)  F43.10     4. Insomnia, unspecified type  G47.00       Receiving Psychotherapy: Yes with Stevphen Meuse, [facility]  RECOMMENDATIONS:   PDMP reviewed.  Modafinil filled 06/09/2023.  Ambien filled 08/14/2023.  Gabapentin filled 05/14/2023.  Xanax filled 08/06/2023. I provided 20 minutes of face to face time during this encounter, including time spent before and after the visit in records review, medical decision making, counseling pertinent to today's visit, and charting.   She is still too anxious and depressed to work at this time. I'll be increasing the Abilify and we need to allow that ample time to work before her going back.  I am extending return to work date to 11/09/2023.  Continue Xanax 1 mg, 1/2-1 p.o.  twice daily as needed. Increase Abilify to 2 mg + 5 mg =7 mg q am. Continue Wellbutrin XL 150 mg +300 mg daily. Continue gabapentin 600 mg 3 times daily. Increase Ambien to 10 mg, 1 p.o. nightly as needed sleep. Continue vitamins as on med list. Continue therapy with Stevphen Meuse, [facility]. Return in 6 wks.  Melony Overly, PA-C

## 2023-08-20 NOTE — Telephone Encounter (Signed)
 Received disability paperwork. Placed in Traci's box 3/27

## 2023-08-26 ENCOUNTER — Telehealth: Payer: Self-pay | Admitting: Physician Assistant

## 2023-08-26 NOTE — Telephone Encounter (Signed)
 Pt lvm checking on the status of her FMLA. Please call her at [phone]

## 2023-08-27 ENCOUNTER — Ambulatory Visit (INDEPENDENT_AMBULATORY_CARE_PROVIDER_SITE_OTHER): Payer: 59 | Admitting: Physician Assistant

## 2023-08-28 DIAGNOSIS — Z0289 Encounter for other administrative examinations: Secondary | ICD-10-CM

## 2023-08-28 NOTE — Telephone Encounter (Signed)
 See previous phone message. Faxed and pt notified

## 2023-08-28 NOTE — Telephone Encounter (Signed)
Paper work completed and given to [place] to review and sign.

## 2023-09-07 ENCOUNTER — Ambulatory Visit: Admitting: Psychiatry

## 2023-09-07 DIAGNOSIS — F331 Major depressive disorder, recurrent, moderate: Secondary | ICD-10-CM | POA: Diagnosis not present
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
## 2023-09-23 ENCOUNTER — Ambulatory Visit: Payer: Managed Care, Other (non HMO) | Admitting: Psychiatry

## 2023-10-10 ENCOUNTER — Other Ambulatory Visit: Payer: Self-pay | Admitting: Family

## 2023-10-10 DIAGNOSIS — I10 Essential (primary) hypertension: Secondary | ICD-10-CM

## 2023-10-12 ENCOUNTER — Ambulatory Visit (INDEPENDENT_AMBULATORY_CARE_PROVIDER_SITE_OTHER): Admitting: Psychiatry

## 2023-10-12 DIAGNOSIS — F331 Major depressive disorder, recurrent, moderate: Secondary | ICD-10-CM

## 2023-10-12 NOTE — Progress Notes (Signed)
 [facility] Counselor/Therapist Progress Note  Patient ID: Victoria Floyd, MRN: [id number],    Date: 10/12/2023  Time Spent: 52 minutes start time 4:00 PM end time 4:52 PM Virtual Visit via Video Note Connected with patient by a telemedicine/telehealth application, with their informed consent, and verified patient privacy and that I am speaking with the correct person using two identifiers. I discussed the limitations, risks, security and privacy concerns of performing psychotherapy and the availability of in person appointments. I also discussed with the patient that there may be a patient responsible charge related to this service. The patient expressed understanding and agreed to proceed. I discussed the treatment planning with the patient. The patient was provided an opportunity to ask questions and all were answered. The patient agreed with the plan and demonstrated an understanding of the instructions. The patient was advised to call  our office if  symptoms worsen or feel they are in a crisis state and need immediate contact.   Therapist Location: home Patient Location: home    Treatment Type: Individual Therapy  Reported Symptoms: sadness, anxiety, triggered responses, panic, depression, intrusive thoughts, flashbacks, migraines  Mental Status Exam:  Appearance:   Well Groomed     Behavior:  Appropriate  Motor:  Normal  Speech/Language:   Normal Rate  Affect:  Appropriate  Mood:  anxious and depressed  Thought process:  circumstantial  Thought content:    WNL  Sensory/Perceptual disturbances:    WNL  Orientation:  oriented to person, place, time/date, and situation  Attention:  Good  Concentration:  Good  Memory:  WNL  Fund of knowledge:   Good  Insight:    Good  Judgment:   Good  Impulse Control:  Good   Risk Assessment: Danger to Self:  No Self-injurious Behavior: No Danger to Others: No Duty to Warn:no Physical Aggression / Violence:No  Access to Firearms  a concern: No  Gang Involvement:No   Subjective: Met with patient via virtual session. She shared that she went to her son's for her [place] [employer] and Mother's Day and that was a good thing. She has been going to the basement and it was hard the first time but now she is going there without issue. She shared she wanted to talk about her social anxiety about going back to work today.  Reminded patient of her grounding exercises and CBT skills that she can utilize as needed.  Had patient participate in mindfulness exercises.  Also, had her think through her emotions and thoughts regarding returning to work. As she was doing that encouraged her to realize what the truth about her situation and how others would see her was so she could develop plans on how to handle different situations. Patient was able to develop a script that she could share with others when they asked why she had been out of work. The importance of giving herself time to adjust to the routines and expectations of work was addressed and patient was able to develop CBT filters to use to help talk herself through the return.  Interventions: Cognitive Behavioral Therapy, Dialectical Behavioral Therapy, Solution-Oriented/Positive Psychology, and Insight-Oriented  Diagnosis:   ICD-10-CM   1. MDD (major depressive disorder), recurrent episode, moderate (HCC)  F33.1       Plan:  Patient is to practice CBT, DBT, and coping skills to manage depression and anxiety.  Patient is to work on plans from session to help her adjust back to work.  Patient is also to  work on getting out of the house and going to safe places for at least a few minutes a few times a week.  Patient is to try and find ways to get out of her room including going to her mothers in the store.  Patient is to continue identifying cognitive distortions and managing them.  The patient is to take medication as directed.  Patient is to try doing a brain dump sort of journaling prior to  sleep   [name], LCMHC

## 2023-10-12 NOTE — Telephone Encounter (Signed)
 High risk or very high risk warning populated when attempting to refill metoprolol . RX request sent to PCP for review and approval if warranted.

## 2023-10-16 ENCOUNTER — Ambulatory Visit (INDEPENDENT_AMBULATORY_CARE_PROVIDER_SITE_OTHER): Admitting: Physician Assistant

## 2023-10-29 ENCOUNTER — Encounter: Payer: Managed Care, Other (non HMO) | Admitting: Family

## 2023-11-01 NOTE — Progress Notes (Signed)
   This encounter was created in error - please disregard. No show

## 2023-11-02 ENCOUNTER — Telehealth: Payer: Self-pay | Admitting: Physician Assistant

## 2023-11-02 ENCOUNTER — Ambulatory Visit (INDEPENDENT_AMBULATORY_CARE_PROVIDER_SITE_OTHER): Admitting: Physician Assistant

## 2023-11-02 ENCOUNTER — Encounter: Payer: Self-pay | Admitting: Physician Assistant

## 2023-11-02 DIAGNOSIS — F3342 Major depressive disorder, recurrent, in full remission: Secondary | ICD-10-CM

## 2023-11-02 DIAGNOSIS — G4700 Insomnia, unspecified: Secondary | ICD-10-CM

## 2023-11-02 DIAGNOSIS — F411 Generalized anxiety disorder: Secondary | ICD-10-CM

## 2023-11-02 DIAGNOSIS — F4310 Post-traumatic stress disorder, unspecified: Secondary | ICD-10-CM

## 2023-11-02 DIAGNOSIS — F331 Major depressive disorder, recurrent, moderate: Secondary | ICD-10-CM

## 2023-11-02 MED ORDER — ZOLPIDEM TARTRATE 10 MG PO TABS: 10.0000 mg | ORAL_TABLET | Freq: Every evening | ORAL | 2 refills | Status: DC | PRN

## 2023-11-02 MED ORDER — ARIPIPRAZOLE 10 MG PO TABS: 10.0000 mg | ORAL_TABLET | Freq: Every day | ORAL | 2 refills | Status: DC

## 2023-11-02 MED ORDER — BUPROPION HCL ER (XL) 150 MG PO TB24: ORAL_TABLET | ORAL | 1 refills | Status: DC

## 2023-11-02 MED ORDER — GABAPENTIN 600 MG PO TABS: 600.0000 mg | ORAL_TABLET | Freq: Three times a day (TID) | ORAL | 1 refills | Status: DC

## 2023-11-02 MED ORDER — BUPROPION HCL ER (XL) 300 MG PO TB24: 300.0000 mg | ORAL_TABLET | Freq: Every day | ORAL | 1 refills | Status: DC

## 2023-11-02 NOTE — Progress Notes (Signed)
 [facility]  Patient ID: Victoria Floyd,  MRN: [id number]  PCP: Estil Heman, NP  Date of Evaluation: 11/02/2023 Time spent:20 minutes  Chief Complaint:  Chief Complaint   Depression; Anxiety; Insomnia; Follow-up    HISTORY/CURRENT STATUS: HPI  For routine med check.   She feels a lot better with her mood.  The Abilify  has been working great.  She would like to increase the dose so she can feel even better if that is possible.  He has felt more like doing things, for example going out to eat with her mom.  Going to [name] has been more scary though.  She cannot stay in there long.  Energy and motivation are better.  She does things around the house where as 6 months ago she would not have been able to.  Appetite is normal and weight is stable.  ADLs and personal hygiene are normal.  She does not cry easily.  No feelings of hopelessness.  Anxiety is well treated with Xanax .  Sleeps well with the Ambien .  Denies suicidal or homicidal thoughts.  Even though she is apprehensive, she is ready to go back to work.  She has also run out of time and financially she has to go back.    Patient denies increased energy with decreased need for sleep, increased talkativeness, racing thoughts, impulsivity or risky behaviors, increased spending, increased libido, grandiosity, increased irritability or anger, paranoia, or hallucinations.  Denies dizziness, syncope, seizures, numbness, tingling, tremor, tics, unsteady gait, slurred speech, confusion. Denies muscle or joint pain, stiffness, or dystonia.  Individual Medical History/ Review of Systems: Changes? :No   Past medications for mental health diagnoses include: Ambien - Able to fall asleep. Sometimes still had dreams. Took for about 20 years. Xanax - Prescribed two 1 mg tabs for about 20 years. Effexor - Sleepy Zoloft- Has increased anxiety  Prozac Lexapro Cymbalta - initially seemed to be helpful and then developed  trembling Wellbutrin  XL- Reports that this has been helpful for her mood and hot flashes. Gabapentin -Prescribed for pain. Trazodone -Increased anxiety, nausea Prazosin -caused sedation even at 1 mg Modafinil  was ineffective  Allergies: Prazosin  and Codeine  Current Medications:  Current Outpatient Medications:    ALPRAZolam  (XANAX ) 1 MG tablet, Take 0.5-1 tablets (0.5-1 mg total) by mouth 2 (two) times daily as needed for anxiety., Disp: 60 tablet, Rfl: 5   ARIPiprazole  (ABILIFY ) 10 MG tablet, Take 1 tablet (10 mg total) by mouth daily., Disp: 30 tablet, Rfl: 2   Aspirin-Acetaminophen -Caffeine (GOODY HEADACHE PO), Take by mouth., Disp: , Rfl:    atorvastatin  (LIPITOR) 20 MG tablet, TAKE ONE TABLET BY MOUTH DAILY, Disp: 90 tablet, Rfl: 0   Biotin 1 MG CAPS, Take by mouth., Disp: , Rfl:    fluticasone  (FLONASE ) 50 MCG/ACT nasal spray, Place 2 sprays into both nostrils daily as needed for allergies or rhinitis., Disp: 16 g, Rfl: 11   methocarbamol  (ROBAXIN ) 500 MG tablet, TAKE 2 TABLETS BY MOUTH TWICE DAILY FOR MUSCLE SPAMS, Disp: 120 tablet, Rfl: 11   metoprolol  tartrate (LOPRESSOR ) 100 MG tablet, TAKE TWO TABLETS BY MOUTH DAILY, Disp: 180 tablet, Rfl: 1   Multiple Vitamin (MULTIVITAMIN) tablet, Take 1 tablet by mouth daily., Disp: , Rfl:    buPROPion  (WELLBUTRIN  XL) 150 MG 24 hr tablet, TAKE ONE TABLET BY MOUTH DAILY TAKE WITH 300MG  TO EQUAL A TOTAL DOSE OF 450MG , Disp: 90 tablet, Rfl: 1   buPROPion  (WELLBUTRIN  XL) 300 MG 24 hr tablet, Take 1 tablet (300 mg total) by mouth daily.,  Disp: 90 tablet, Rfl: 1   gabapentin  (NEURONTIN ) 600 MG tablet, Take 1 tablet (600 mg total) by mouth 3 (three) times daily., Disp: 270 tablet, Rfl: 1   zolpidem  (AMBIEN ) 10 MG tablet, Take 1 tablet (10 mg total) by mouth at bedtime as needed for sleep., Disp: 30 tablet, Rfl: 2 Medication Side Effects: none  Family Medical/ Social History: Changes? No  MENTAL HEALTH EXAM:  Last menstrual period 07/12/2014.There  is no height or weight on file to calculate BMI.  General Appearance: Casual and Well Groomed  Eye Contact:  Good  Speech:  Clear and Coherent and Normal Rate  Volume:  Normal  Mood:  Euthymic  Affect:  Congruent  Thought Process:  Goal Directed and Descriptions of Associations: Circumstantial  Orientation:  Full (Time, Place, and Person)  Thought Content: Logical   Suicidal Thoughts:  No  Homicidal Thoughts:  No  Memory:  WNL  Judgement:  Good  Insight:  Good  Psychomotor Activity:  Normal  Concentration:  Concentration: Good and Attention Span: Fair  Recall:  Good  Fund of Knowledge: Good  Language: Good  Assets:  Desire for Improvement Financial Resources/Insurance Housing Transportation Vocational/Educational  ADL's:  Intact  Cognition: WNL  Prognosis:  Good   DIAGNOSES:    ICD-10-CM   1. Recurrent major depression in full remission (HCC)  F33.42     2. GAD (generalized anxiety disorder)  F41.1 gabapentin  (NEURONTIN ) 600 MG tablet    3. PTSD (post-traumatic stress disorder)  F43.10 gabapentin  (NEURONTIN ) 600 MG tablet    4. Insomnia, unspecified type  G47.00     5. Major depressive disorder, recurrent episode, moderate (HCC)  F33.1 buPROPion  (WELLBUTRIN  XL) 300 MG 24 hr tablet    buPROPion  (WELLBUTRIN  XL) 150 MG 24 hr tablet      Receiving Psychotherapy: Yes with [name], [facility]  RECOMMENDATIONS:   PDMP reviewed.  Ambien  filled 10/30/2023.  Gabapentin  filled 10/08/2023.  Xanax  filled 10/07/2023.   I provided 20 minutes of face to face time during this encounter, including time spent before and after the visit in records review, medical decision making, counseling pertinent to today's visit, and charting.   I am glad to see her doing much better.  It is appropriate to increase the Abilify .  Return to work on 11/09/2023 and FMLA for 6 days a month intermittent.   Continue Xanax  1 mg, 1/2-1 p.o. twice daily as needed.  Increase Abilify  to 10 mg daily.  Continue  Wellbutrin  XL 150 mg +300 mg daily. Continue gabapentin  600 mg 3 times daily. Continue Ambien   10 mg, 1 p.o. nightly as needed sleep. Continue vitamins as on med list. Continue therapy with [name], [facility]. Return in 3 months.     Marvia Slocumb, PA-C

## 2023-11-02 NOTE — Telephone Encounter (Signed)
 Received disability forms. Given to [facility] 6/9

## 2023-11-03 DIAGNOSIS — Z0289 Encounter for other administrative examinations: Secondary | ICD-10-CM

## 2023-11-03 NOTE — Telephone Encounter (Signed)
 Paperwork completed and faxed. RTW on 11/09/23 with Intermittent FMLA

## 2023-12-09 ENCOUNTER — Other Ambulatory Visit: Payer: Self-pay | Admitting: Nurse Practitioner

## 2023-12-09 DIAGNOSIS — J302 Other seasonal allergic rhinitis: Secondary | ICD-10-CM

## 2023-12-09 NOTE — Telephone Encounter (Deleted)
 It's been over a year since pt has ben seen

## 2023-12-17 ENCOUNTER — Encounter: Payer: Self-pay | Admitting: Physician Assistant

## 2023-12-17 ENCOUNTER — Ambulatory Visit (INDEPENDENT_AMBULATORY_CARE_PROVIDER_SITE_OTHER): Admitting: Physician Assistant

## 2023-12-17 DIAGNOSIS — F3341 Major depressive disorder, recurrent, in partial remission: Secondary | ICD-10-CM

## 2023-12-17 DIAGNOSIS — F4310 Post-traumatic stress disorder, unspecified: Secondary | ICD-10-CM | POA: Diagnosis not present

## 2023-12-17 DIAGNOSIS — G4700 Insomnia, unspecified: Secondary | ICD-10-CM | POA: Diagnosis not present

## 2023-12-17 DIAGNOSIS — F411 Generalized anxiety disorder: Secondary | ICD-10-CM

## 2023-12-17 DIAGNOSIS — F515 Nightmare disorder: Secondary | ICD-10-CM

## 2023-12-17 NOTE — Progress Notes (Signed)
 [facility]  Patient ID: Victoria Floyd,  MRN: [id number]  PCP: Victoria Roxan BROCKS, NP  Date of Evaluation: 12/17/2023 Time spent:20 minutes  Chief Complaint:  Chief Complaint   Anxiety; Depression; Follow-up    HISTORY/CURRENT STATUS: HPI  FMLA  Had a nightmare on 12/13/2023. It was so bad, she had to take 3 days off work.  Called out of work but Higher education careers adviser said she doesn't have fmla to cover it.  That's the main reason she's here, to get that cleared up.   Nightmares have been less frequent lately, feels like her meds are working well.  Patient is able to enjoy things.  Energy and motivation are good.  Really likes the Abilify .  It's given her more energy and the desire to do things.  No extreme sadness, tearfulness, or feelings of hopelessness.  Sleeps well most of the time.  ADLs and personal hygiene are normal.   Denies any changes in concentration, making decisions, or remembering things.  Appetite has not changed.  Weight is stable.   No mania, delirium, AH/VH.  No SI/HI.  Denies dizziness, syncope, seizures, numbness, tingling, tremor, tics, unsteady gait, slurred speech, confusion. Denies muscle or joint pain, stiffness, or dystonia.  Individual Medical History/ Review of Systems: Changes? :No   Past medications for mental health diagnoses include: Ambien - Able to fall asleep. Sometimes still had dreams. Took for about 20 years. Xanax - Prescribed two 1 mg tabs for about 20 years. Effexor - Sleepy Zoloft- Has increased anxiety  Prozac Lexapro Cymbalta - initially seemed to be helpful and then developed trembling Wellbutrin  XL- Reports that this has been helpful for her mood and hot flashes. Gabapentin -Prescribed for pain. Trazodone -Increased anxiety, nausea Prazosin -caused sedation even at 1 mg Modafinil  was ineffective  Allergies: Prazosin  and Codeine  Current Medications:  Current Outpatient Medications:    ALPRAZolam  (XANAX ) 1 MG tablet, Take 0.5-1  tablets (0.5-1 mg total) by mouth 2 (two) times daily as needed for anxiety., Disp: 60 tablet, Rfl: 5   ARIPiprazole  (ABILIFY ) 10 MG tablet, Take 1 tablet (10 mg total) by mouth daily., Disp: 30 tablet, Rfl: 2   Aspirin-Acetaminophen -Caffeine (GOODY HEADACHE PO), Take by mouth., Disp: , Rfl:    atorvastatin  (LIPITOR) 20 MG tablet, TAKE ONE TABLET BY MOUTH DAILY, Disp: 90 tablet, Rfl: 0   Biotin 1 MG CAPS, Take by mouth., Disp: , Rfl:    buPROPion  (WELLBUTRIN  XL) 150 MG 24 hr tablet, TAKE ONE TABLET BY MOUTH DAILY TAKE WITH 300MG  TO EQUAL A TOTAL DOSE OF 450MG , Disp: 90 tablet, Rfl: 1   buPROPion  (WELLBUTRIN  XL) 300 MG 24 hr tablet, Take 1 tablet (300 mg total) by mouth daily., Disp: 90 tablet, Rfl: 1   fluticasone  (FLONASE ) 50 MCG/ACT nasal spray, Place 2 sprays into both nostrils daily as needed for allergies or rhinitis., Disp: 16 g, Rfl: 11   gabapentin  (NEURONTIN ) 600 MG tablet, Take 1 tablet (600 mg total) by mouth 3 (three) times daily., Disp: 270 tablet, Rfl: 1   metoprolol  tartrate (LOPRESSOR ) 100 MG tablet, TAKE TWO TABLETS BY MOUTH DAILY, Disp: 180 tablet, Rfl: 1   Multiple Vitamin (MULTIVITAMIN) tablet, Take 1 tablet by mouth daily., Disp: , Rfl:    zolpidem  (AMBIEN ) 10 MG tablet, Take 1 tablet (10 mg total) by mouth at bedtime as needed for sleep., Disp: 30 tablet, Rfl: 2   methocarbamol  (ROBAXIN ) 500 MG tablet, TAKE 2 TABLETS BY MOUTH TWICE DAILY FOR MUSCLE SPAMS, Disp: 120 tablet, Rfl: 11 Medication Side Effects: none  Family Medical/ Social History: Changes? No  MENTAL HEALTH EXAM:  Last menstrual period 07/12/2014.There is no height or weight on file to calculate BMI.  General Appearance: Casual and Well Groomed  Eye Contact:  Good  Speech:  Clear and Coherent and Normal Rate  Volume:  Normal  Mood:  Euthymic  Affect:  Congruent  Thought Process:  Goal Directed and Descriptions of Associations: Circumstantial  Orientation:  Full (Time, Place, and Person)  Thought Content:  Logical   Suicidal Thoughts:  No  Homicidal Thoughts:  No  Memory:  WNL  Judgement:  Good  Insight:  Good  Psychomotor Activity:  Normal  Concentration:  Concentration: Good and Attention Span: Fair  Recall:  Good  Fund of Knowledge: Good  Language: Good  Assets:  Communication Skills Desire for Improvement Financial Resources/Insurance Housing Transportation Vocational/Educational  ADL's:  Intact  Cognition: WNL  Prognosis:  Good   DIAGNOSES:    ICD-10-CM   1. PTSD (post-traumatic stress disorder)  F43.10     2. Recurrent major depression in partial remission (HCC)  F33.41     3. GAD (generalized anxiety disorder)  F41.1     4. Insomnia, unspecified type  G47.00     5. Nightmares  F51.5        Receiving Psychotherapy: Yes with Victoria Floyd, [facility]  RECOMMENDATIONS:   PDMP reviewed.  Ambien  filled 12/09/2023.  Xanax  filled 12/09/2023.  Gabapentin  filled 11/10/2023. I provided approximately  20 minutes of face to face time during this encounter, including time spent before and after the visit in records review, medical decision making, counseling pertinent to today's visit, and charting.   We discussed the FMLA. Note in chart from 11/03/2023 and copy of form, she is covered for 6 work days per month.  Victoria Scroggins, LPN is aware of this and will take of it.   I'm glad to see Victoria Floyd back at work and doing well.   Continue Xanax  1 mg, 1/2-1 p.o. twice daily as needed.  Continue Abilify  10 mg daily.  Continue Wellbutrin  XL 150 mg +300 mg daily. Continue gabapentin  600 mg 3 times daily. Continue Ambien   10 mg, 1 p.o. nightly as needed sleep. Continue vitamins as on med list. Continue therapy with Victoria Floyd, [facility]. Return in 3 months.     Victoria Cooks, PA-C

## 2023-12-29 ENCOUNTER — Other Ambulatory Visit: Payer: Self-pay | Admitting: Physician Assistant

## 2024-01-20 ENCOUNTER — Ambulatory Visit (INDEPENDENT_AMBULATORY_CARE_PROVIDER_SITE_OTHER): Admitting: Psychiatry

## 2024-01-21 ENCOUNTER — Other Ambulatory Visit: Payer: Self-pay | Admitting: Nurse Practitioner

## 2024-01-21 DIAGNOSIS — J302 Other seasonal allergic rhinitis: Secondary | ICD-10-CM

## 2024-01-28 ENCOUNTER — Other Ambulatory Visit: Payer: Self-pay | Admitting: Physician Assistant

## 2024-01-28 DIAGNOSIS — F4310 Post-traumatic stress disorder, unspecified: Secondary | ICD-10-CM

## 2024-01-28 DIAGNOSIS — F411 Generalized anxiety disorder: Secondary | ICD-10-CM

## 2024-02-01 ENCOUNTER — Other Ambulatory Visit: Payer: Self-pay | Admitting: Physician Assistant

## 2024-02-02 ENCOUNTER — Encounter: Payer: Self-pay | Admitting: Physician Assistant

## 2024-02-02 ENCOUNTER — Ambulatory Visit (INDEPENDENT_AMBULATORY_CARE_PROVIDER_SITE_OTHER): Admitting: Physician Assistant

## 2024-02-02 DIAGNOSIS — F331 Major depressive disorder, recurrent, moderate: Secondary | ICD-10-CM | POA: Diagnosis not present

## 2024-02-02 DIAGNOSIS — F411 Generalized anxiety disorder: Secondary | ICD-10-CM

## 2024-02-02 DIAGNOSIS — F4310 Post-traumatic stress disorder, unspecified: Secondary | ICD-10-CM | POA: Diagnosis not present

## 2024-02-02 MED ORDER — GABAPENTIN 600 MG PO TABS: 600.0000 mg | ORAL_TABLET | Freq: Three times a day (TID) | ORAL | 1 refills | Status: AC

## 2024-02-02 MED ORDER — ZOLPIDEM TARTRATE 10 MG PO TABS: 10.0000 mg | ORAL_TABLET | Freq: Every evening | ORAL | 5 refills | Status: DC | PRN

## 2024-02-02 MED ORDER — ARIPIPRAZOLE 10 MG PO TABS: 10.0000 mg | ORAL_TABLET | Freq: Every day | ORAL | 1 refills | Status: DC

## 2024-02-02 MED ORDER — BUPROPION HCL ER (XL) 300 MG PO TB24: 300.0000 mg | ORAL_TABLET | Freq: Every day | ORAL | 1 refills | Status: DC

## 2024-02-02 MED ORDER — BUPROPION HCL ER (XL) 150 MG PO TB24: ORAL_TABLET | ORAL | 1 refills | Status: DC

## 2024-02-02 MED ORDER — ALPRAZOLAM 1 MG PO TABS: 0.5000 mg | ORAL_TABLET | Freq: Two times a day (BID) | ORAL | 5 refills | Status: AC | PRN

## 2024-02-02 NOTE — Progress Notes (Signed)
 [facility]  Patient ID: Victoria Floyd,  MRN: [id number]  PCP: Leonarda Roxan BROCKS, NP  Date of Evaluation: 02/02/2024 Time spent:20 minutes  Chief Complaint:  Chief Complaint   Depression; Anxiety; Follow-up    HISTORY/CURRENT STATUS: HPI     Needs forms filled out for work.  She was told she needed FMLA forms completed, HR sent her to the wrong forms yesterday so she does not have what she needs for us  to complete.  Work is okay.  It is stressful and has been hard to get back in the swing of things.  Due to the anxiety especially, she misses work occasionally.  Does not have the energy or presence of mind to be there every day.  Misses several days a month.  Sleeps good.  Still has weird dreams at times but not as disturbing as in the past.  She does not enjoy much of anything at the moment, mostly because she is tired from work.  ADLs and personal hygiene are normal.  Appetite is normal and weight is stable.  No feelings of hopelessness.  Still gets anxious sometimes, overwhelmed, no panic attacks.  Xanax  is effective when needed.  No suicidal or homicidal thoughts.  Patient denies increased energy with decreased need for sleep, increased talkativeness, racing thoughts, impulsivity or risky behaviors, increased spending, increased libido, grandiosity, increased irritability or anger, paranoia, or hallucinations.  Individual Medical History/ Review of Systems: Changes? :No   Past medications for mental health diagnoses include: Ambien - Able to fall asleep. Sometimes still had dreams. Took for about 20 years. Xanax - Prescribed two 1 mg tabs for about 20 years. Effexor - Sleepy Zoloft- Has increased anxiety  Prozac Lexapro Cymbalta - initially seemed to be helpful and then developed trembling Wellbutrin  XL- Reports that this has been helpful for her mood and hot flashes. Gabapentin -Prescribed for pain. Trazodone -Increased anxiety, nausea Prazosin -caused sedation even at  1 mg Modafinil  was ineffective  Allergies: Prazosin  and Codeine  Current Medications:  Current Outpatient Medications:    Aspirin-Acetaminophen -Caffeine (GOODY HEADACHE PO), Take by mouth., Disp: , Rfl:    atorvastatin  (LIPITOR) 20 MG tablet, TAKE ONE TABLET BY MOUTH DAILY, Disp: 90 tablet, Rfl: 0   Biotin 1 MG CAPS, Take by mouth., Disp: , Rfl:    fluticasone  (FLONASE ) 50 MCG/ACT nasal spray, Place 2 sprays into both nostrils daily as needed for allergies or rhinitis. Needs an appointment before anymore Future Refills., Disp: 16 g, Rfl: 0   methocarbamol  (ROBAXIN ) 500 MG tablet, TAKE 2 TABLETS BY MOUTH TWICE DAILY FOR MUSCLE SPAMS, Disp: 120 tablet, Rfl: 11   metoprolol  tartrate (LOPRESSOR ) 100 MG tablet, TAKE TWO TABLETS BY MOUTH DAILY, Disp: 180 tablet, Rfl: 1   Multiple Vitamin (MULTIVITAMIN) tablet, Take 1 tablet by mouth daily., Disp: , Rfl:    ALPRAZolam  (XANAX ) 1 MG tablet, Take 0.5-1 tablets (0.5-1 mg total) by mouth 2 (two) times daily as needed. for anxiety, Disp: 60 tablet, Rfl: 5   ARIPiprazole  (ABILIFY ) 10 MG tablet, Take 1 tablet (10 mg total) by mouth daily., Disp: 90 tablet, Rfl: 1   buPROPion  (WELLBUTRIN  XL) 150 MG 24 hr tablet, TAKE ONE TABLET BY MOUTH DAILY TAKE WITH 300MG  TO EQUAL A TOTAL DOSE OF 450MG , Disp: 90 tablet, Rfl: 1   buPROPion  (WELLBUTRIN  XL) 300 MG 24 hr tablet, Take 1 tablet (300 mg total) by mouth daily., Disp: 90 tablet, Rfl: 1   gabapentin  (NEURONTIN ) 600 MG tablet, Take 1 tablet (600 mg total) by mouth 3 (three) times  daily., Disp: 270 tablet, Rfl: 1   zolpidem  (AMBIEN ) 10 MG tablet, Take 1 tablet (10 mg total) by mouth at bedtime as needed for sleep., Disp: 30 tablet, Rfl: 5 Medication Side Effects: none  Family Medical/ Social History: Changes? No  MENTAL HEALTH EXAM:  Last menstrual period 07/12/2014.There is no height or weight on file to calculate BMI.  General Appearance: Casual and Well Groomed  Eye Contact:  Good  Speech:  Clear and Coherent  and Normal Rate  Volume:  Normal  Mood:  Euthymic  Affect:  Congruent  Thought Process:  Goal Directed and Descriptions of Associations: Circumstantial  Orientation:  Full (Time, Place, and Person)  Thought Content: Logical   Suicidal Thoughts:  No  Homicidal Thoughts:  No  Memory:  WNL  Judgement:  Good  Insight:  Good  Psychomotor Activity:  Normal  Concentration:  Concentration: Good and Attention Span: Fair  Recall:  Good  Fund of Knowledge: Good  Language: Good  Assets:  Communication Skills Desire for Improvement Financial Resources/Insurance Housing Resilience Transportation Vocational/Educational  ADL's:  Intact  Cognition: WNL  Prognosis:  Good   DIAGNOSES:    ICD-10-CM   1. GAD (generalized anxiety disorder)  F41.1 ALPRAZolam  (XANAX ) 1 MG tablet    gabapentin  (NEURONTIN ) 600 MG tablet    2. PTSD (post-traumatic stress disorder)  F43.10 ALPRAZolam  (XANAX ) 1 MG tablet    gabapentin  (NEURONTIN ) 600 MG tablet    3. Major depressive disorder, recurrent episode, moderate (HCC)  F33.1 buPROPion  (WELLBUTRIN  XL) 300 MG 24 hr tablet    buPROPion  (WELLBUTRIN  XL) 150 MG 24 hr tablet      Receiving Psychotherapy: Yes with Silvano Pacini, [facility]  RECOMMENDATIONS:   PDMP reviewed.  Ambien  filled 01/28/2024.  Xanax  filled 02/01/2024.  Gabapentin  filled 01/13/2024. I provided approximately 20 minutes of face to face time during this encounter, including time spent before and after the visit in records review, medical decision making, counseling pertinent to today's visit, and charting.   We will refax the previous FMLA forms or once Deidra gets the correct forms, they can be filled out for intermittent FMLA, 6 days/month.  She is stable as far as her medications goes so no changes will be made.  Continue Xanax  1 mg, 1/2-1 p.o. twice daily as needed.  Continue Abilify  10 mg daily.  Continue Wellbutrin  XL 150 mg +300 mg daily. Continue gabapentin  600 mg 3 times daily. Continue  Ambien   10 mg, 1 p.o. nightly as needed sleep. Continue vitamins as on med list. Continue therapy with Silvano Pacini, [facility]. Return in 3 months.   Verneita Cooks, PA-C

## 2024-02-06 ENCOUNTER — Other Ambulatory Visit: Payer: Self-pay | Admitting: Physician Assistant

## 2024-02-27 ENCOUNTER — Other Ambulatory Visit: Payer: Self-pay | Admitting: Family

## 2024-02-27 DIAGNOSIS — I10 Essential (primary) hypertension: Secondary | ICD-10-CM

## 2024-05-03 ENCOUNTER — Encounter: Payer: Self-pay | Admitting: Physician Assistant

## 2024-05-03 ENCOUNTER — Ambulatory Visit: Admitting: Physician Assistant

## 2024-05-03 DIAGNOSIS — F411 Generalized anxiety disorder: Secondary | ICD-10-CM | POA: Diagnosis not present

## 2024-05-03 DIAGNOSIS — F4310 Post-traumatic stress disorder, unspecified: Secondary | ICD-10-CM | POA: Diagnosis not present

## 2024-05-03 DIAGNOSIS — G4700 Insomnia, unspecified: Secondary | ICD-10-CM | POA: Diagnosis not present

## 2024-05-03 DIAGNOSIS — F3341 Major depressive disorder, recurrent, in partial remission: Secondary | ICD-10-CM | POA: Diagnosis not present

## 2024-05-03 MED ORDER — ARIPIPRAZOLE 10 MG PO TABS: 10.0000 mg | ORAL_TABLET | Freq: Every day | ORAL | 0 refills | Status: AC

## 2024-05-03 NOTE — Progress Notes (Unsigned)
 [facility]  Patient ID: Victoria Floyd,  MRN: [id number]  PCP: Leonarda Roxan BROCKS, NP  Date of Evaluation: 05/03/2024 Time spent:20 minutes  Chief Complaint:  Chief Complaint   Anxiety; Depression; Insomnia; Follow-up    HISTORY/CURRENT STATUS: HPI         Individual Medical History/ Review of Systems: Changes? :No   Past medications for mental health diagnoses include: Ambien - Able to fall asleep. Sometimes still had dreams. Took for about 20 years. Xanax - Prescribed two 1 mg tabs for about 20 years. Effexor - Sleepy Zoloft- Has increased anxiety  Prozac Lexapro Cymbalta - initially seemed to be helpful and then developed trembling Wellbutrin  XL- Reports that this has been helpful for her mood and hot flashes. Gabapentin -Prescribed for pain. Trazodone -Increased anxiety, nausea Prazosin -caused sedation even at 1 mg Modafinil  was ineffective  Allergies: Prazosin  and Codeine  Current Medications:  Current Outpatient Medications:    ALPRAZolam  (XANAX ) 1 MG tablet, Take 0.5-1 tablets (0.5-1 mg total) by mouth 2 (two) times daily as needed. for anxiety, Disp: 60 tablet, Rfl: 5   Aspirin-Acetaminophen -Caffeine (GOODY HEADACHE PO), Take by mouth., Disp: , Rfl:    atorvastatin  (LIPITOR) 20 MG tablet, TAKE ONE TABLET BY MOUTH DAILY, Disp: 90 tablet, Rfl: 0   Biotin 1 MG CAPS, Take by mouth., Disp: , Rfl:    buPROPion  (WELLBUTRIN  XL) 150 MG 24 hr tablet, TAKE ONE TABLET BY MOUTH DAILY TAKE WITH 300MG  TO EQUAL A TOTAL DOSE OF 450MG , Disp: 90 tablet, Rfl: 1   buPROPion  (WELLBUTRIN  XL) 300 MG 24 hr tablet, Take 1 tablet (300 mg total) by mouth daily., Disp: 90 tablet, Rfl: 1   fluticasone  (FLONASE ) 50 MCG/ACT nasal spray, Place 2 sprays into both nostrils daily as needed for allergies or rhinitis. Needs an appointment before anymore Future Refills., Disp: 16 g, Rfl: 0   gabapentin  (NEURONTIN ) 600 MG tablet, Take 1 tablet (600 mg total) by mouth 3 (three) times daily.,  Disp: 270 tablet, Rfl: 1   methocarbamol  (ROBAXIN ) 500 MG tablet, TAKE 2 TABLETS BY MOUTH TWICE DAILY FOR MUSCLE SPAMS, Disp: 120 tablet, Rfl: 11   metoprolol  tartrate (LOPRESSOR ) 100 MG tablet, TAKE TWO TABLETS BY MOUTH DAILY, Disp: 60 tablet, Rfl: 0   Multiple Vitamin (MULTIVITAMIN) tablet, Take 1 tablet by mouth daily., Disp: , Rfl:    zolpidem  (AMBIEN ) 10 MG tablet, Take 1 tablet (10 mg total) by mouth at bedtime as needed for sleep., Disp: 30 tablet, Rfl: 5   ARIPiprazole  (ABILIFY ) 10 MG tablet, Take 1 tablet (10 mg total) by mouth daily., Disp: 90 tablet, Rfl: 0 Medication Side Effects: none  Family Medical/ Social History: Changes? No  MENTAL HEALTH EXAM:  Last menstrual period 07/12/2014.There is no height or weight on file to calculate BMI.  General Appearance: Casual and Well Groomed  Eye Contact:  Good  Speech:  Clear and Coherent and Normal Rate  Volume:  Normal  Mood:  Euthymic  Affect:  Congruent  Thought Process:  Goal Directed and Descriptions of Associations: Circumstantial  Orientation:  Full (Time, Place, and Person)  Thought Content: Logical   Suicidal Thoughts:  No  Homicidal Thoughts:  No  Memory:  WNL  Judgement:  Good  Insight:  Good  Psychomotor Activity:  Normal  Concentration:  Concentration: Good and Attention Span: Fair  Recall:  Good  Fund of Knowledge: Good  Language: Good  Assets:  Communication Skills Desire for Improvement Financial Resources/Insurance Housing Resilience Transportation Vocational/Educational  ADL's:  Intact  Cognition: WNL  Prognosis:  Good   DIAGNOSES:  No diagnosis found.  Receiving Psychotherapy: Yes with Silvano Pacini, [facility]  RECOMMENDATIONS:   PDMP reviewed.  Ambien  filled 03/30/2024. Xanax  filled 04/01/2024.   Gabapentin  filled 01/13/2024.    Continue Xanax  1 mg, 1/2-1 p.o. twice daily as needed.  Continue Abilify  10 mg daily.  Continue Wellbutrin  XL 150 mg +300 mg daily. Continue gabapentin  600 mg 3 times  daily. Continue Ambien   10 mg, 1 p.o. nightly as needed sleep. Continue vitamins as on med list. Continue therapy with Silvano Pacini, [facility]. Return in 3 months.   Verneita Cooks, PA-C

## 2024-05-18 ENCOUNTER — Other Ambulatory Visit: Payer: Self-pay | Admitting: Physician Assistant

## 2024-05-18 DIAGNOSIS — G4700 Insomnia, unspecified: Secondary | ICD-10-CM

## 2024-05-20 ENCOUNTER — Other Ambulatory Visit: Payer: Self-pay | Admitting: Physician Assistant

## 2024-05-20 DIAGNOSIS — F331 Major depressive disorder, recurrent, moderate: Secondary | ICD-10-CM

## 2024-05-21 NOTE — Telephone Encounter (Signed)
Due 1/1

## 2024-05-25 ENCOUNTER — Other Ambulatory Visit: Payer: Self-pay | Admitting: Family

## 2024-05-25 ENCOUNTER — Other Ambulatory Visit: Payer: Self-pay

## 2024-05-25 DIAGNOSIS — I10 Essential (primary) hypertension: Secondary | ICD-10-CM

## 2024-06-24 ENCOUNTER — Other Ambulatory Visit: Payer: Self-pay | Admitting: Family

## 2024-06-24 DIAGNOSIS — I10 Essential (primary) hypertension: Secondary | ICD-10-CM

## 2024-08-02 ENCOUNTER — Ambulatory Visit (INDEPENDENT_AMBULATORY_CARE_PROVIDER_SITE_OTHER): Payer: Self-pay | Admitting: Physician Assistant
